# Patient Record
Sex: Female | Born: 1940 | Race: Black or African American | Hispanic: No | Smoking: Former smoker
Health system: Southern US, Community
[De-identification: ages and names within clinical notes are randomized; demographics above are authoritative.]

## PROBLEM LIST (undated history)

## (undated) DIAGNOSIS — C55 Malignant neoplasm of uterus, part unspecified: Secondary | ICD-10-CM

## (undated) HISTORY — PX: ABDOMINAL HYSTERECTOMY: SHX81

---

## 2005-10-28 ENCOUNTER — Emergency Department (HOSPITAL_COMMUNITY): Admission: EM | Admit: 2005-10-28 | Discharge: 2005-10-28 | Payer: Self-pay | Admitting: Family Medicine

## 2019-09-23 ENCOUNTER — Inpatient Hospital Stay (HOSPITAL_COMMUNITY)
Admission: EM | Admit: 2019-09-23 | Discharge: 2019-10-10 | DRG: 180 | Disposition: A | Discharge status: Skilled Nursing Facility | Payer: Medicare Other | Attending: Internal Medicine | Admitting: Internal Medicine

## 2019-09-23 ENCOUNTER — Emergency Department (HOSPITAL_COMMUNITY): Payer: Medicare Other

## 2019-09-23 ENCOUNTER — Encounter (HOSPITAL_COMMUNITY): Payer: Self-pay | Admitting: *Deleted

## 2019-09-23 ENCOUNTER — Other Ambulatory Visit: Payer: Self-pay

## 2019-09-23 DIAGNOSIS — C7802 Secondary malignant neoplasm of left lung: Secondary | ICD-10-CM | POA: Diagnosis present

## 2019-09-23 DIAGNOSIS — C779 Secondary and unspecified malignant neoplasm of lymph node, unspecified: Secondary | ICD-10-CM | POA: Diagnosis present

## 2019-09-23 DIAGNOSIS — Z20822 Contact with and (suspected) exposure to covid-19: Secondary | ICD-10-CM | POA: Diagnosis present

## 2019-09-23 DIAGNOSIS — Z789 Other specified health status: Secondary | ICD-10-CM

## 2019-09-23 DIAGNOSIS — E871 Hypo-osmolality and hyponatremia: Secondary | ICD-10-CM

## 2019-09-23 DIAGNOSIS — J910 Malignant pleural effusion: Secondary | ICD-10-CM | POA: Diagnosis present

## 2019-09-23 DIAGNOSIS — E222 Syndrome of inappropriate secretion of antidiuretic hormone: Secondary | ICD-10-CM | POA: Diagnosis present

## 2019-09-23 DIAGNOSIS — C7951 Secondary malignant neoplasm of bone: Secondary | ICD-10-CM | POA: Diagnosis present

## 2019-09-23 DIAGNOSIS — Z8542 Personal history of malignant neoplasm of other parts of uterus: Secondary | ICD-10-CM | POA: Diagnosis not present

## 2019-09-23 DIAGNOSIS — I871 Compression of vein: Secondary | ICD-10-CM | POA: Diagnosis present

## 2019-09-23 DIAGNOSIS — Z90710 Acquired absence of both cervix and uterus: Secondary | ICD-10-CM

## 2019-09-23 DIAGNOSIS — R042 Hemoptysis: Secondary | ICD-10-CM | POA: Diagnosis present

## 2019-09-23 DIAGNOSIS — Z7401 Bed confinement status: Secondary | ICD-10-CM

## 2019-09-23 DIAGNOSIS — C3411 Malignant neoplasm of upper lobe, right bronchus or lung: Secondary | ICD-10-CM

## 2019-09-23 DIAGNOSIS — J869 Pyothorax without fistula: Secondary | ICD-10-CM | POA: Diagnosis not present

## 2019-09-23 DIAGNOSIS — Z452 Encounter for adjustment and management of vascular access device: Secondary | ICD-10-CM

## 2019-09-23 DIAGNOSIS — B9620 Unspecified Escherichia coli [E. coli] as the cause of diseases classified elsewhere: Secondary | ICD-10-CM | POA: Diagnosis present

## 2019-09-23 DIAGNOSIS — Z800 Family history of malignant neoplasm of digestive organs: Secondary | ICD-10-CM

## 2019-09-23 DIAGNOSIS — C3491 Malignant neoplasm of unspecified part of right bronchus or lung: Secondary | ICD-10-CM | POA: Diagnosis not present

## 2019-09-23 DIAGNOSIS — J90 Pleural effusion, not elsewhere classified: Secondary | ICD-10-CM | POA: Diagnosis present

## 2019-09-23 DIAGNOSIS — R339 Retention of urine, unspecified: Secondary | ICD-10-CM | POA: Diagnosis present

## 2019-09-23 DIAGNOSIS — C3490 Malignant neoplasm of unspecified part of unspecified bronchus or lung: Secondary | ICD-10-CM

## 2019-09-23 DIAGNOSIS — Q256 Stenosis of pulmonary artery: Secondary | ICD-10-CM | POA: Diagnosis not present

## 2019-09-23 DIAGNOSIS — R0602 Shortness of breath: Secondary | ICD-10-CM

## 2019-09-23 DIAGNOSIS — Z87891 Personal history of nicotine dependence: Secondary | ICD-10-CM | POA: Diagnosis not present

## 2019-09-23 DIAGNOSIS — E878 Other disorders of electrolyte and fluid balance, not elsewhere classified: Secondary | ICD-10-CM | POA: Diagnosis present

## 2019-09-23 DIAGNOSIS — I2694 Multiple subsegmental pulmonary emboli without acute cor pulmonale: Secondary | ICD-10-CM

## 2019-09-23 DIAGNOSIS — B961 Klebsiella pneumoniae [K. pneumoniae] as the cause of diseases classified elsewhere: Secondary | ICD-10-CM | POA: Diagnosis present

## 2019-09-23 DIAGNOSIS — C7801 Secondary malignant neoplasm of right lung: Secondary | ICD-10-CM | POA: Diagnosis present

## 2019-09-23 DIAGNOSIS — J9601 Acute respiratory failure with hypoxia: Secondary | ICD-10-CM | POA: Diagnosis present

## 2019-09-23 DIAGNOSIS — R222 Localized swelling, mass and lump, trunk: Secondary | ICD-10-CM | POA: Diagnosis not present

## 2019-09-23 DIAGNOSIS — I2602 Saddle embolus of pulmonary artery with acute cor pulmonale: Secondary | ICD-10-CM | POA: Diagnosis not present

## 2019-09-23 DIAGNOSIS — Z9889 Other specified postprocedural states: Secondary | ICD-10-CM

## 2019-09-23 DIAGNOSIS — Z01811 Encounter for preprocedural respiratory examination: Secondary | ICD-10-CM

## 2019-09-23 DIAGNOSIS — Z515 Encounter for palliative care: Secondary | ICD-10-CM | POA: Diagnosis not present

## 2019-09-23 DIAGNOSIS — I2699 Other pulmonary embolism without acute cor pulmonale: Secondary | ICD-10-CM | POA: Diagnosis present

## 2019-09-23 DIAGNOSIS — I2693 Single subsegmental pulmonary embolism without acute cor pulmonale: Secondary | ICD-10-CM | POA: Diagnosis not present

## 2019-09-23 DIAGNOSIS — C787 Secondary malignant neoplasm of liver and intrahepatic bile duct: Secondary | ICD-10-CM | POA: Diagnosis present

## 2019-09-23 DIAGNOSIS — Z7189 Other specified counseling: Secondary | ICD-10-CM | POA: Diagnosis not present

## 2019-09-23 DIAGNOSIS — R55 Syncope and collapse: Secondary | ICD-10-CM

## 2019-09-23 DIAGNOSIS — Z9689 Presence of other specified functional implants: Secondary | ICD-10-CM

## 2019-09-23 HISTORY — DX: Malignant neoplasm of uterus, part unspecified: C55

## 2019-09-23 LAB — CBC
HCT: 33 % — ABNORMAL LOW (ref 36.0–46.0)
Hemoglobin: 11 g/dL — ABNORMAL LOW (ref 12.0–15.0)
MCH: 26.8 pg (ref 26.0–34.0)
MCHC: 33.3 g/dL (ref 30.0–36.0)
MCV: 80.3 fL (ref 80.0–100.0)
Platelets: 222 10*3/uL (ref 150–400)
RBC: 4.11 MIL/uL (ref 3.87–5.11)
RDW: 15.1 % (ref 11.5–15.5)
WBC: 7.1 10*3/uL (ref 4.0–10.5)
nRBC: 0 % (ref 0.0–0.2)

## 2019-09-23 LAB — BASIC METABOLIC PANEL
Anion gap: 13 (ref 5–15)
BUN: 15 mg/dL (ref 8–23)
CO2: 21 mmol/L — ABNORMAL LOW (ref 22–32)
Calcium: 9.1 mg/dL (ref 8.9–10.3)
Chloride: 87 mmol/L — ABNORMAL LOW (ref 98–111)
Creatinine, Ser: 0.86 mg/dL (ref 0.44–1.00)
GFR calc Af Amer: >60 mL/min (ref >60)
GFR calc non Af Amer: >60 mL/min (ref >60)
Glucose, Bld: 103 mg/dL — ABNORMAL HIGH (ref 70–99)
Potassium: 4.4 mmol/L (ref 3.5–5.1)
Sodium: 121 mmol/L — ABNORMAL LOW (ref 135–145)

## 2019-09-23 LAB — POC SARS CORONAVIRUS 2 AG -  ED: SARS Coronavirus 2 Ag: NEGATIVE

## 2019-09-23 LAB — SARS CORONAVIRUS 2 (TAT 6-24 HRS): SARS Coronavirus 2: NEGATIVE

## 2019-09-23 LAB — TROPONIN I (HIGH SENSITIVITY)
Troponin I (High Sensitivity): 25 ng/L — ABNORMAL HIGH (ref <18)
Troponin I (High Sensitivity): 23 ng/L — ABNORMAL HIGH (ref <18)

## 2019-09-23 MED ORDER — SODIUM CHLORIDE 0.9% FLUSH: 3.0000 mL | Freq: Once | INTRAVENOUS | Status: DC

## 2019-09-23 MED ORDER — SODIUM CHLORIDE 0.9 % IV SOLN: INTRAVENOUS | Status: AC

## 2019-09-23 MED ORDER — IOHEXOL 350 MG/ML SOLN
75.0000 mL | Freq: Once | INTRAVENOUS | Status: AC | PRN
  Administered 2019-09-23: 75 mL via INTRAVENOUS

## 2019-09-23 MED ORDER — ACETAMINOPHEN 650 MG RE SUPP: 650.0000 mg | Freq: Four times a day (QID) | RECTAL | Status: DC | PRN

## 2019-09-23 MED ORDER — HEPARIN (PORCINE) 25000 UT/250ML-% IV SOLN
1050.0000 [IU]/h | INTRAVENOUS | Status: DC
  Administered 2019-09-23: 1200 [IU]/h via INTRAVENOUS
  Filled 2019-09-23 (×2): qty 250

## 2019-09-23 MED ORDER — HEPARIN BOLUS VIA INFUSION
4000.0000 [IU] | Freq: Once | INTRAVENOUS | Status: AC
  Administered 2019-09-23: 4000 [IU] via INTRAVENOUS
  Filled 2019-09-23: qty 4000

## 2019-09-23 MED ORDER — ACETAMINOPHEN 325 MG PO TABS: 650.0000 mg | ORAL_TABLET | Freq: Four times a day (QID) | ORAL | Status: DC | PRN

## 2019-09-23 NOTE — ED Notes (Signed)
When I brought Pt back from [facility] Pt had just had her Vitals taken in [facility], Pulse Ox was 97%. When getting the Pt up, undressed, and settled in Bed Pt's O2 was at 92% with good wave form. Pt was also short of breath with movement. Pt's now at 98% after taking deep breaths

## 2019-09-23 NOTE — ED Notes (Addendum)
Pt asked if she could walk to bathroom but upon standing pt was a little unsteady and short of breath from just getting out of the bed. Talked Pt into using a bedside toilet instead. Pt's O2 dropped to 90% while moving out and in bed. Pt now back in bed and O2 is 97%

## 2019-09-23 NOTE — ED Triage Notes (Signed)
Pt arrived by [facility] for sob and cough x 1 week. Reports cough is productive with blood tinged sputum. Denies fever.

## 2019-09-23 NOTE — ED Provider Notes (Signed)
[facility] Provider Note   CSN: [id number] Arrival date & time: 09/23/19  0443     History Chief Complaint  Patient presents with  . Shortness of Breath    Emily Kim is a 79 y.o. female with no significant past medical history presenting to the ED with a chief complaint of shortness of breath and cough.  Reports cough productive with mucus intermittently productive with blood-tinged sputum.  She reports worsening shortness of breath as well.  She did have an episode 2 days ago when she syncopized while getting out of the shower.  She is unsure how long she lost consciousness for which she believes it was several minutes.  States that it did take her several hours to get up because "I had to figure out what happened."  Denies any headache or vision changes, anticoagulant use.  She denies any chest pain.  No known COVID-19 exposures.  She has tried some over-the-counter cough medication with some improvement in her symptoms.  Denies any fever, but does endorse chills.  Denies any abdominal pain, nausea vomiting, history of DVT, PE, MI, leg swelling.  HPI     History reviewed. No pertinent past medical history.  There are no problems to display for this patient.   History reviewed. No pertinent surgical history.   OB History   No obstetric history on file.     History reviewed. No pertinent family history.  Social History   Tobacco Use  . Smoking status: Never Smoker  Substance Use Topics  . Alcohol use: Not on file  . Drug use: Not on file    Home Medications Prior to Admission medications   Medication Sig Start Date End Date Taking? Authorizing Provider  OVER THE COUNTER MEDICATION Take 1 tablet by mouth daily. Cold and cough medication   Yes [provider]    Allergies    Patient has no known allergies.  Review of Systems   Review of Systems  Constitutional: Negative for appetite change, chills and fever.    HENT: Negative for ear pain, rhinorrhea, sneezing and sore throat.   Eyes: Negative for photophobia and visual disturbance.  Respiratory: Positive for cough and shortness of breath. Negative for chest tightness and wheezing.   Cardiovascular: Negative for chest pain and palpitations.  Gastrointestinal: Negative for abdominal pain, blood in stool, constipation, diarrhea, nausea and vomiting.  Genitourinary: Negative for dysuria, hematuria and urgency.  Musculoskeletal: Negative for myalgias.  Skin: Negative for rash.  Neurological: Negative for dizziness, weakness and light-headedness.    Physical Exam Updated Vital Signs BP 126/61 (BP Location: Right Arm)   Pulse 93   Temp 97.9 F (36.6 C) (Oral)   Resp 20   SpO2 97%   Physical Exam Vitals and nursing note reviewed.  Constitutional:      General: She is not in acute distress.    Appearance: She is well-developed.  HENT:     Head: Normocephalic and atraumatic.     Nose: Nose normal.  Eyes:     General: No scleral icterus.       Left eye: No discharge.     Conjunctiva/sclera: Conjunctivae normal.  Cardiovascular:     Rate and Rhythm: Normal rate and regular rhythm.     Heart sounds: Normal heart sounds. No murmur. No friction rub. No gallop.   Pulmonary:     Effort: Pulmonary effort is normal. No respiratory distress.     Breath sounds: Normal breath sounds.  Abdominal:  General: Bowel sounds are normal. There is no distension.     Palpations: Abdomen is soft.     Tenderness: There is no abdominal tenderness. There is no guarding.  Musculoskeletal:        General: Normal range of motion.     Cervical back: Normal range of motion and neck supple.     Right lower leg: No tenderness.     Left lower leg: No tenderness.  Skin:    General: Skin is warm and dry.     Findings: No rash.  Neurological:     General: No focal deficit present.     Mental Status: She is alert and oriented to person, place, and time.      Cranial Nerves: No cranial nerve deficit.     Motor: No weakness or abnormal muscle tone.     Coordination: Coordination normal.     ED Results / Procedures / Treatments   Labs (all labs ordered are listed, but only abnormal results are displayed) Labs Reviewed  BASIC METABOLIC PANEL - Abnormal; Notable for the following components:      Result Value   Sodium 121 (*)    Chloride 87 (*)    CO2 21 (*)    Glucose, Bld 103 (*)    All other components within normal limits  CBC - Abnormal; Notable for the following components:   Hemoglobin 11.0 (*)    HCT 33.0 (*)    All other components within normal limits  TROPONIN I (HIGH SENSITIVITY) - Abnormal; Notable for the following components:   Troponin I (High Sensitivity) 25 (*)    All other components within normal limits  SARS CORONAVIRUS 2 (TAT 6-24 HRS)  POC SARS CORONAVIRUS 2 AG -  ED  TROPONIN I (HIGH SENSITIVITY)    EKG None  Radiology DG Chest Portable 1 View  Result Date: 09/23/2019 CLINICAL DATA:  Shortness of breath and cough for 1 week, productive blood-tinged sputum EXAM: PORTABLE CHEST 1 VIEW COMPARISON:  None FINDINGS: There is a complex right pleural effusion tracking within the fissures and along the lung apex, worrisome for loculation. Adjacent opacity likely reflect some passive atelectasis though an underlying consolidative processes not excluded. No pneumothorax. Left lung is essentially clear. The aorta is calcified. Right heart border is obscured by adjacent opacity. Remaining cardiomediastinal contours are unremarkable. Degenerative changes are present in the imaged spine and shoulders. No acute osseous or soft tissue abnormality. IMPRESSION: 1. Findings worrisome for complex right pleural effusion. Underlying consolidative processes not excluded. Could consider CT chest with contrast for further characterization. Electronically Signed   By: Lovena Le M.D.   On: 09/23/2019 05:36    Procedures Procedures  (including critical care time)  Medications Ordered in ED Medications  sodium chloride flush (NS) 0.9 % injection 3 mL (has no administration in time range)    ED Course  I have reviewed the triage vital signs and the nursing notes.  Pertinent labs & imaging results that were available during my care of the patient were reviewed by me and considered in my medical decision making (see chart for details).  Clinical Course as of Sep 22 1514  Tue Sep 23, 2019  1406 Sodium(!): 121 [HK]  1446 Troponin I (High Sensitivity)(!): 25 [HK]    Clinical Course User Index [HK] Delia Heady, PA-C   MDM Rules/Calculators/A&P                      Emily Kim was  evaluated in Emergency Department on 09/23/19  for the symptoms described in the history of present illness. He/she was evaluated in the context of the global COVID-19 pandemic, which necessitated consideration that the patient might be at risk for infection with the SARS-CoV-2 virus that causes COVID-19. Institutional protocols and algorithms that pertain to the evaluation of patients at risk for COVID-19 are in a state of rapid change based on information released by regulatory bodies including the CDC and federal and state organizations. These policies and algorithms were followed during the patient's care in the ED.  79 year old female with no significant past medical history presenting to the ED with a chief complaint of shortness of breath and cough.  Cough is productive with mucus and intermittently blood tinged.  Had a syncopal episode 2 days ago while getting out of the shower.  Denies chest pain, headache, vision changes, numbness in arms or legs.  No known COVID-19 exposures. No neurological deficits noted on my examination. She is not tachycardic, tachypnec or hypoxic.  Work-up significant for hyponatremia 121, elevated troponin at 25. POC covid test is negative. CXR shows possible infiltrate vs pleural effusion. Will obtain CT head due  to her recent unwitnessed syncopal episode as well as a chest CT to better characterize this CXR finding and evaluate for PE. She will need admission for her hyponatremia and shortness of breath. Care handed off to oncoming provider pending remainder of workup and disposition.   Final Clinical Impression(s) / ED Diagnoses Final diagnoses:  Hyponatremia  SOB (shortness of breath)  Syncope, unspecified syncope type  Pleural effusion    Rx / DC Orders ED Discharge Orders    None      Portions of this note were generated with Dragon dictation software. Dictation errors may occur despite best attempts at proofreading.    Delia Heady, PA-C 09/23/19 1517    Lajean Saver, MD 09/26/19 1425

## 2019-09-23 NOTE — H&P (Addendum)
TRH H&P    Patient Demographics:    Emily Kim, is a 79 y.o. female  MRN: [id number]  DOB - 1940-09-28  Admit Date - 09/23/2019  Referring MD/NP/PA:  Madilyn Hook  Outpatient Primary MD for the patient is Patient, No Pcp Per  Patient coming from:  home  Chief complaint- hemoptysis   HPI:    Emily Kim  is a 79 y.o. female,  w hx of uterine cancer s/p hysterectomy [place], [place]), apparently notes 50lbs of weight loss over the past 3 month and hemoptysis intermittently over the past 2 weeks. Pt denies fever, chills, productive cough, cp, palp, abd pain, n/v, diarrhea, brbpr, black stool.  Pt denies head / neck swelling.   In ED,  T 97.6, P 98 R 18, Bp 151/64  Pox 100% onRA  Wt 74.8 kg  CT brain  IMPRESSION: 1. No acute intracranial abnormality. 2. Mild cerebral atrophy and microvascular disease changes of the supratentorial brain. 3. Moderate severity right maxillary sinus mucosal thickening. The presence of a large polyp versus mucous retention cyst cannot be excluded.  CTA chest IMPRESSION: 1. Acute segmental pulmonary emboli in the left lower lobe. 2. Widely metastatic lung cancer. Large ill-defined right hilar and suprahilar mass with bilateral pulmonary parenchymal metastases and lymphangitic carcinomatosis. Multiple osseous metastases.  3. The dominant mass results in severe stenosis of the SVC with multiple mediastinal and right chest wall collateral veins, prominent filling of the azygos system and IVC, as well as enhancement of the anterior left hepatic lobe. Correlate for signs and symptoms of SVC syndrome. 4. Similar resultant severe stenosis of the distal right pulmonary artery and narrowing of the right upper, middle, and lower lobe bronchi with complete collapse of the right middle lobe. 5. Moderate loculated right pleural effusion. Small left pleural Effusion.  Na 121, K 4.4,  Bun 15, Creatinine 0.86 Wbc 7.1, hgb 11.0, Plt 222 Trop 25-> 23  Sars Coronavirus negative   Pt will be admitted for pulmonary embolism with widely metastatic lung cancer, large ill defined right hilar and suprahilar mass with bilateral pulmonary metastates and lymphangitis carcinomatosis, and multiple osseous metastases and ? SVC syndrome   Review of systems:    In addition to the HPI above,  No Fever-chills, No Headache, No changes with Vision or hearing, No problems swallowing food or Liquids, No Chest pain, No Cough or Shortness of Breath, No Abdominal pain, No Nausea or Vomiting, bowel movements are regular, No Blood in stool or Urine, No dysuria, No new skin rashes or bruises, No new joints pains-aches,  No new weakness, tingling, numbness in any extremity, No recent weight gain or loss, No polyuria, polydypsia or polyphagia, No significant Mental Stressors.  All other systems reviewed and are negative.    Past History of the following :    Past Medical History:  Diagnosis Date  . Uterine cancer [facility])       Past Surgical History:  Procedure Laterality Date  . ABDOMINAL HYSTERECTOMY        Social History:  Social History   Tobacco Use  . Smoking status: Former Research scientist (life sciences)  . Smokeless tobacco: Never Used  Substance Use Topics  . Alcohol use: Not Currently       Family History :     Family History  Problem Relation Age of Onset  . Cancer Neg Hx        Home Medications:   Prior to Admission medications   Medication Sig Start Date End Date Taking? Authorizing Provider  OVER THE COUNTER MEDICATION Take 1 tablet by mouth daily. Cold and cough medication   Yes [provider]     Allergies:    No Known Allergies   Physical Exam:   Vitals  Blood pressure (!) 152/76, pulse 96, temperature 97.9 F (36.6 C), temperature source Oral, resp. rate (!) 30, height _0  (1.702 m), weight 74.8 kg, SpO2 100 %.  1.  General: axoxo3  2.  Psychiatric: euthymic  3. Neurologic: nonfocal  4. HEENMT:  Anicteric, pupils 1.52m symmetric, direct, consensual, near intact, no head swelling Neck: no jvd, no cervical / supraclavicular adenopathy, no stridor  5. Respiratory : Decrease in bs at the right lung base, no wheezing  6. Cardiovascular : rrr s1, s2, no m/g/r  7. Gastrointestinal:  Abd: soft, nt, nd , +bs  8. Skin:  Ext: (lower) no c/c/e,  No rash,  ? Slight swelling of the right arm compared to left ?  9.Musculoskeletal:  Good ROM    Data Review:    CBC Recent Labs  Lab 09/23/19 0507  WBC 7.1  HGB 11.0*  HCT 33.0*  PLT 222  MCV 80.3  MCH 26.8  MCHC 33.3  RDW 15.1   ------------------------------------------------------------------------------------------------------------------  Results for orders placed or performed during the hospital encounter of 09/23/19 (from the past 48 hour(s))  Basic metabolic panel     Status: Abnormal   Collection Time: 09/23/19  5:07 AM  Result Value Ref Range   Sodium 121 (L) 135 - 145 mmol/L   Potassium 4.4 3.5 - 5.1 mmol/L   Chloride 87 (L) 98 - 111 mmol/L   CO2 21 (L) 22 - 32 mmol/L   Glucose, Bld 103 (H) 70 - 99 mg/dL   BUN 15 8 - 23 mg/dL   Creatinine, Ser 0.86 0.44 - 1.00 mg/dL   Calcium 9.1 8.9 - 10.3 mg/dL   GFR calc non Af Amer >60 >60 mL/min   GFR calc Af Amer >60 >60 mL/min   Anion gap 13 5 - 15    Comment: Performed at [facility] [id number], [place] [id number] CBC     Status: Abnormal   Collection Time: 09/23/19  5:07 AM  Result Value Ref Range   WBC 7.1 4.0 - 10.5 K/uL   RBC 4.11 3.87 - 5.11 MIL/uL   Hemoglobin 11.0 (L) 12.0 - 15.0 g/dL   HCT 33.0 (L) 36.0 - 46.0 %   MCV 80.3 80.0 - 100.0 fL   MCH 26.8 26.0 - 34.0 pg   MCHC 33.3 30.0 - 36.0 g/dL   RDW 15.1 11.5 - 15.5 %   Platelets 222 150 - 400 K/uL   nRBC 0.0 0.0 - 0.2 %    Comment: Performed at [facility] [id number], [id number]   Troponin I (High Sensitivity)     Status: Abnormal   Collection Time: 09/23/19  5:07 AM  Result Value Ref Range   Troponin I (High Sensitivity) 25 (H) <  18 ng/L    Comment: (NOTE) Elevated high sensitivity troponin I (hsTnI) values and significant  changes across serial measurements may suggest ACS but many other  chronic and acute conditions are known to elevate hsTnI results.  Refer to the "Links" section for chest pain algorithms and additional  guidance. Performed at [facility], [place] [address] [place], [place] [address]   POC SARS Coronavirus 2 Ag-ED - Nasal Swab (BD Veritor Kit)     Status: None   Collection Time: 09/23/19  5:32 AM  Result Value Ref Range   SARS Coronavirus 2 Ag NEGATIVE NEGATIVE    Comment: (NOTE) SARS-CoV-2 antigen NOT DETECTED.  Negative results are presumptive.  Negative results do not preclude SARS-CoV-2 infection and should not be used as the sole basis for treatment or other patient management decisions, including infection  control decisions, particularly in the presence of clinical signs and  symptoms consistent with COVID-19, or in those who have been in contact with the virus.  Negative results must be combined with clinical observations, patient history, and epidemiological information. The expected result is Negative. Fact Sheet for Patients: PodPark.tn Fact Sheet for Healthcare Providers: GiftContent.is This test is not yet approved or cleared by the [place] FDA and  has been authorized for detection and/or diagnosis of SARS-CoV-2 by FDA under an Emergency Use Authorization (EUA).  This EUA will remain in effect (meaning this test can be used) for the duration of  the COVID-19 de claration under Section 564(b)(1) of the Act, 21 U.S.C. section 360bbb-3(b)(1), unless the authorization is terminated or revoked sooner.   Troponin I (High Sensitivity)     Status: Abnormal    Collection Time: 09/23/19  2:30 PM  Result Value Ref Range   Troponin I (High Sensitivity) 23 (H) <18 ng/L    Comment: (NOTE) Elevated high sensitivity troponin I (hsTnI) values and significant  changes across serial measurements may suggest ACS but many other  chronic and acute conditions are known to elevate hsTnI results.  Refer to the "Links" section for chest pain algorithms and additional  guidance. Performed at [facility], [place] [address], [place], [place] [address]   SARS CORONAVIRUS 2 (TAT 6-24 HRS) Nasopharyngeal Nasopharyngeal Swab     Status: None   Collection Time: 09/23/19  2:51 PM   Specimen: Nasopharyngeal Swab  Result Value Ref Range   SARS Coronavirus 2 NEGATIVE NEGATIVE    Comment: (NOTE) SARS-CoV-2 target nucleic acids are NOT DETECTED. The SARS-CoV-2 RNA is generally detectable in upper and lower respiratory specimens during the acute phase of infection. Negative results do not preclude SARS-CoV-2 infection, do not rule out co-infections with other pathogens, and should not be used as the sole basis for treatment or other patient management decisions. Negative results must be combined with clinical observations, patient history, and epidemiological information. The expected result is Negative. Fact Sheet for Patients: SugarRoll.be Fact Sheet for Healthcare Providers: https://www.woods-mathews.com/ This test is not yet approved or cleared by the [place] FDA and  has been authorized for detection and/or diagnosis of SARS-CoV-2 by FDA under an Emergency Use Authorization (EUA). This EUA will remain  in effect (meaning this test can be used) for the duration of the COVID-19 declaration under Section 56 4(b)(1) of the Act, 21 U.S.C. section 360bbb-3(b)(1), unless the authorization is terminated or revoked sooner. Performed at [facility], [place] [address] [place], [place] [address]     Chemistries    Recent Labs  Lab 09/23/19 0507  NA 121*  K 4.4  CL 87*  CO2 21*  GLUCOSE 103*  BUN 15  CREATININE 0.86  CALCIUM 9.1   ------------------------------------------------------------------------------------------------------------------  ------------------------------------------------------------------------------------------------------------------ GFR: Estimated Creatinine Clearance: 56.9 mL/min (by C-G formula based on SCr of 0.86 mg/dL). Liver Function Tests: No results for input(s): AST, ALT, ALKPHOS, BILITOT, PROT, ALBUMIN in the last 168 hours. No results for input(s): LIPASE, AMYLASE in the last 168 hours. No results for input(s): AMMONIA in the last 168 hours. Coagulation Profile: No results for input(s): INR, PROTIME in the last 168 hours. Cardiac Enzymes: No results for input(s): CKTOTAL, CKMB, CKMBINDEX, TROPONINI in the last 168 hours. BNP (last 3 results) No results for input(s): PROBNP in the last 8760 hours. HbA1C: No results for input(s): HGBA1C in the last 72 hours. CBG: No results for input(s): GLUCAP in the last 168 hours. Lipid Profile: No results for input(s): CHOL, HDL, LDLCALC, TRIG, CHOLHDL, LDLDIRECT in the last 72 hours. Thyroid Function Tests: No results for input(s): TSH, T4TOTAL, FREET4, T3FREE, THYROIDAB in the last 72 hours. Anemia Panel: No results for input(s): VITAMINB12, FOLATE, FERRITIN, TIBC, IRON, RETICCTPCT in the last 72 hours.  --------------------------------------------------------------------------------------------------------------- Urine analysis: No results found for: COLORURINE, APPEARANCEUR, LABSPEC, PHURINE, GLUCOSEU, HGBUR, BILIRUBINUR, KETONESUR, PROTEINUR, UROBILINOGEN, NITRITE, LEUKOCYTESUR    Imaging Results:    CT Head Wo Contrast  Result Date: 09/23/2019 CLINICAL DATA:  Status post trauma. EXAM: CT HEAD WITHOUT CONTRAST TECHNIQUE: Contiguous axial images were obtained from the base of the skull through the vertex  without intravenous contrast. COMPARISON:  None. FINDINGS: Brain: There is mild cerebral atrophy with widening of the extra-axial spaces and ventricular dilatation. There are areas of decreased attenuation within the white matter tracts of the supratentorial brain, consistent with microvascular disease changes. Vascular: No hyperdense vessel or unexpected calcification. Skull: Normal. Negative for fracture or focal lesion. Sinuses/Orbits: There is moderate severity right maxillary sinus mucosal thickening. Other: None. IMPRESSION: 1. No acute intracranial abnormality. 2. Mild cerebral atrophy and microvascular disease changes of the supratentorial brain. 3. Moderate severity right maxillary sinus mucosal thickening. The presence of a large polyp versus mucous retention cyst cannot be excluded. Electronically Signed   By: Virgina Norfolk M.D.   On: 09/23/2019 20:38   CT Angio Chest PE W/Cm &/Or Wo Cm  Result Date: 09/23/2019 CLINICAL DATA:  Shortness of breath and cough for the past week. Blood tinged sputum. EXAM: CT ANGIOGRAPHY CHEST WITH CONTRAST TECHNIQUE: Multidetector CT imaging of the chest was performed using the standard protocol during bolus administration of intravenous contrast. Multiplanar CT image reconstructions and MIPs were obtained to evaluate the vascular anatomy. CONTRAST:  42m OMNIPAQUE IOHEXOL 350 MG/ML SOLN COMPARISON:  Chest x-ray from same day. FINDINGS: Cardiovascular: Satisfactory opacification of the pulmonary arteries to the segmental level. Acute segmental pulmonary emboli in the left lower lobe. Severe stenosis of the distal right pulmonary artery. Normal heart size. No pericardial effusion. Severe stenosis of the SVC with multiple mediastinal and right chest wall collateral veins, and prominent enhancement of the azygos system. No thoracic aortic aneurysm. Mediastinum/Nodes: No enlarged mediastinal, hilar, or axillary lymph nodes. Thyroid gland, trachea, and esophagus demonstrate  no significant findings. Lungs/Pleura: Large ill-defined right hilar and suprahilar mass with narrowing of the right upper, middle, and lower lobe bronchi. Complete collapse of the right middle lobe. Perilymphatic nodularity and innumerable small pulmonary nodules in both lungs. Moderate loculated right pleural effusion. Small left pleural effusion. Upper Abdomen: Enhancement of the anterior left hepatic  lobe. Punctate nonobstructive left renal calculus. Musculoskeletal: Sclerosis of the lateral right ninth rib and left aspect of the sternal body. Sclerotic lesions involving the T3, T6, T7, T9, T10, and T12 vertebral bodies. Review of the MIP images confirms the above findings. IMPRESSION: 1. Acute segmental pulmonary emboli in the left lower lobe. 2. Widely metastatic lung cancer. Large ill-defined right hilar and suprahilar mass with bilateral pulmonary parenchymal metastases and lymphangitic carcinomatosis. Multiple osseous metastases. 3. The dominant mass results in severe stenosis of the SVC with multiple mediastinal and right chest wall collateral veins, prominent filling of the azygos system and IVC, as well as enhancement of the anterior left hepatic lobe. Correlate for signs and symptoms of SVC syndrome. 4. Similar resultant severe stenosis of the distal right pulmonary artery and narrowing of the right upper, middle, and lower lobe bronchi with complete collapse of the right middle lobe. 5. Moderate loculated right pleural effusion. Small left pleural effusion. Critical Value/emergent results were called by telephone at the time of interpretation on 09/23/2019 at 8:59 pm to provider Claiborne Billings PA, who verbally acknowledged these results. Electronically Signed   By: Titus Dubin M.D.   On: 09/23/2019 21:08   DG Chest Portable 1 View  Result Date: 09/23/2019 CLINICAL DATA:  Shortness of breath and cough for 1 week, productive blood-tinged sputum EXAM: PORTABLE CHEST 1 VIEW COMPARISON:  None FINDINGS: There is  a complex right pleural effusion tracking within the fissures and along the lung apex, worrisome for loculation. Adjacent opacity likely reflect some passive atelectasis though an underlying consolidative processes not excluded. No pneumothorax. Left lung is essentially clear. The aorta is calcified. Right heart border is obscured by adjacent opacity. Remaining cardiomediastinal contours are unremarkable. Degenerative changes are present in the imaged spine and shoulders. No acute osseous or soft tissue abnormality. IMPRESSION: 1. Findings worrisome for complex right pleural effusion. Underlying consolidative processes not excluded. Could consider CT chest with contrast for further characterization. Electronically Signed   By: Lovena Le M.D.   On: 09/23/2019 05:36   ekg nsr at 100, RAD, no st-t changes c/w ischemia   Assessment & Plan:    Principal Problem:   Pulmonary embolism (HCC) Active Problems:   SVC syndrome   Pleural effusion  Acute pulmonary embolism Elevated troponin Tele Trop I  Check cardiac echo Heparin GTT  Widely metastatic lung cancer (Large ill defined right hilar and suprahilar mass with bilateral pulmonary metastates and multiple osseous metastases) Lymphangitis carcinomatosis  Check LFT Oncology consulted by ED, appreciate input  SVC stenosis (severe) R Pleural effusion, loculated (moderate) Spoke with CT surgery to help evaluate Lung cancer and effusion/ SVC, appreciate input.   Hyponatremia Check serum osm, cortisol, tsh Check urine osm, urine sodium Hydrate gently with ns at 87m per hour Check cmp in am  Anemia Check cbc in am   DVT Prophylaxis-   Heparin GTT  AM Labs Ordered, also please review Full Orders  Family Communication: Admission, patients condition and plan of care including tests being ordered have been discussed with the patient  who indicate understanding and agree with the plan and Code Status.  Code Status:  FULL CODE per patient,   [facility]  Admission status: Inpatient: Based on patients clinical presentation and evaluation of above clinical data, I have made determination that patient meets Inpatient criteria at this time.  Pt has new diagnosis of lung cancer, with PE,  Pt has high risk of clinical deterioration,  Pt will require > 2 nites stay.   Time spent in minutes : 70    Jani Gravel M.D on 09/23/2019 at 10:50 PM

## 2019-09-23 NOTE — Progress Notes (Signed)
ANTICOAGULATION CONSULT NOTE - Initial Consult  Pharmacy Consult for heparin Indication: pulmonary embolus   Patient Measurements: Heparin Dosing Weight: 74 kg  Vital Signs: Temp: 97.9 F (36.6 C) (01/05 1000) Temp Source: Oral (01/05 1000) BP: 153/66 (01/05 1930) Pulse Rate: 91 (01/05 1930)  Labs: Recent Labs    09/23/19 0507 09/23/19 1430  HGB 11.0*  --   HCT 33.0*  --   PLT 222  --   CREATININE 0.86  --   TROPONINIHS 25* 23*      Assessment: 79 yo female admitted with SOB and cough. She is Covid negative. Underwent CTA which revealed a segmental PE. Pt reports bloody sputum prior to admission and a syncopal event a couple days ago. H/h plts wnl, SCr 0.8. Will start heparin infusion.   Goal of Therapy:  Heparin level 0.3-0.7 units/ml Monitor platelets by anticoagulation protocol: Yes     Plan:  -Heparin bolus 4000 units x1 then 1200 units/hr -Daily HL, CBC -First level tomorrow   Harvel Quale 09/23/2019,9:28 PM

## 2019-09-23 NOTE — ED Provider Notes (Signed)
  Physical Exam  BP (!) 152/76   Pulse 96   Temp 97.9 F (36.6 C) (Oral)   Resp (!) 30   Ht 5\' 7"  (1.702 m)   Wt 74.8 kg   SpO2 100%   BMI 25.84 kg/m   Physical Exam  ED Course/Procedures   Clinical Course as of Sep 22 2254  Tue Sep 23, 2019  1406 Sodium(!): 121 [HK]  1446 Troponin I (High Sensitivity)(!): 25 [HK]  1528 Will likely need admission for further workup and treatment of hyponatremia of 121 as well as cough/hemoptysis. Currently stable and not requiring oxygen   [KM]  2051 Spoke with radiologist. CTA reading Metastatic lung caner, SCC syndrome, bone mets and scattered segmental PE. Will initiate heparin per pharmacy consult and consult for admission.    [KM]  2211 Consulted with Dr. Maudie Mercury for admission. Also waiting on oncology consult   [KM]  2227 Spoke with oncology who will consult on the patient here as well. They report thoracic surgery or vascular surgery may be consult for possible stenting of the SVC   [KM]    Clinical Course User Index [HK] Delia Heady, PA-C [KM] Alveria Apley, PA-C    Procedures  MDM  Patient care assumed from [facility] PA due to change of shift. See below copied HPI "Emily Kim is a 79 y.o. female with no significant past medical history presenting to the ED with a chief complaint of shortness of breath and cough.  Reports cough productive with mucus intermittently productive with blood-tinged sputum.  She reports worsening shortness of breath as well.  She did have an episode 2 days ago when she syncopized while getting out of the shower.  She is unsure how long she lost consciousness for which she believes it was several minutes.  States that it did take her several hours to get up because "I had to figure out what happened."  Denies any headache or vision changes, anticoagulant use.  She denies any chest pain.  No known COVID-19 exposures.  She has tried some over-the-counter cough medication with some improvement in her symptoms.   Denies any fever, but does endorse chills.  Denies any abdominal pain, nausea vomiting, history of DVT, PE, MI, leg swelling."      Kristine Royal 09/23/19 2256    Lucrezia Starch, MD 09/25/19 1454

## 2019-09-24 ENCOUNTER — Inpatient Hospital Stay (HOSPITAL_COMMUNITY): Payer: Medicare Other

## 2019-09-24 ENCOUNTER — Ambulatory Visit
Admission: RE | Admit: 2019-09-24 | Discharge: 2019-09-24 | Disposition: A | Discharge status: Home / Self Care | Payer: Medicare Other | Source: Ambulatory Visit | Attending: Radiation Oncology | Admitting: Radiation Oncology

## 2019-09-24 ENCOUNTER — Inpatient Hospital Stay
Admit: 2019-09-24 | Discharge: 2019-09-24 | Disposition: A | Discharge status: Home / Self Care | Payer: Medicare Other | Attending: Radiation Oncology | Admitting: Radiation Oncology

## 2019-09-24 DIAGNOSIS — I2699 Other pulmonary embolism without acute cor pulmonale: Secondary | ICD-10-CM

## 2019-09-24 DIAGNOSIS — C3490 Malignant neoplasm of unspecified part of unspecified bronchus or lung: Secondary | ICD-10-CM

## 2019-09-24 DIAGNOSIS — C7951 Secondary malignant neoplasm of bone: Secondary | ICD-10-CM | POA: Insufficient documentation

## 2019-09-24 DIAGNOSIS — E871 Hypo-osmolality and hyponatremia: Secondary | ICD-10-CM

## 2019-09-24 DIAGNOSIS — J869 Pyothorax without fistula: Secondary | ICD-10-CM

## 2019-09-24 DIAGNOSIS — C3411 Malignant neoplasm of upper lobe, right bronchus or lung: Secondary | ICD-10-CM | POA: Insufficient documentation

## 2019-09-24 DIAGNOSIS — I871 Compression of vein: Secondary | ICD-10-CM | POA: Insufficient documentation

## 2019-09-24 DIAGNOSIS — Z510 Encounter for antineoplastic radiation therapy: Secondary | ICD-10-CM | POA: Insufficient documentation

## 2019-09-24 DIAGNOSIS — C782 Secondary malignant neoplasm of pleura: Secondary | ICD-10-CM | POA: Insufficient documentation

## 2019-09-24 DIAGNOSIS — R222 Localized swelling, mass and lump, trunk: Secondary | ICD-10-CM

## 2019-09-24 DIAGNOSIS — I2694 Multiple subsegmental pulmonary emboli without acute cor pulmonale: Secondary | ICD-10-CM

## 2019-09-24 LAB — COMPREHENSIVE METABOLIC PANEL
ALT: 14 U/L (ref 0–44)
AST: 25 U/L (ref 15–41)
Albumin: 3.1 g/dL — ABNORMAL LOW (ref 3.5–5.0)
Alkaline Phosphatase: 172 U/L — ABNORMAL HIGH (ref 38–126)
Anion gap: 17 — ABNORMAL HIGH (ref 5–15)
BUN: 18 mg/dL (ref 8–23)
CO2: 17 mmol/L — ABNORMAL LOW (ref 22–32)
Calcium: 9.2 mg/dL (ref 8.9–10.3)
Chloride: 88 mmol/L — ABNORMAL LOW (ref 98–111)
Creatinine, Ser: 0.83 mg/dL (ref 0.44–1.00)
GFR calc Af Amer: >60 mL/min (ref >60)
GFR calc non Af Amer: >60 mL/min (ref >60)
Glucose, Bld: 95 mg/dL (ref 70–99)
Potassium: 4.7 mmol/L (ref 3.5–5.1)
Sodium: 122 mmol/L — ABNORMAL LOW (ref 135–145)
Total Bilirubin: 0.6 mg/dL (ref 0.3–1.2)
Total Protein: 7.1 g/dL (ref 6.5–8.1)

## 2019-09-24 LAB — CBC
HCT: 33.7 % — ABNORMAL LOW (ref 36.0–46.0)
Hemoglobin: 10.9 g/dL — ABNORMAL LOW (ref 12.0–15.0)
MCH: 25.8 pg — ABNORMAL LOW (ref 26.0–34.0)
MCHC: 32.3 g/dL (ref 30.0–36.0)
MCV: 79.9 fL — ABNORMAL LOW (ref 80.0–100.0)
Platelets: 199 10*3/uL (ref 150–400)
RBC: 4.22 MIL/uL (ref 3.87–5.11)
RDW: 15.1 % (ref 11.5–15.5)
WBC: 8 10*3/uL (ref 4.0–10.5)
nRBC: 0 % (ref 0.0–0.2)

## 2019-09-24 LAB — PROTIME-INR
INR: 1.3 — ABNORMAL HIGH (ref 0.8–1.2)
Prothrombin Time: 15.9 seconds — ABNORMAL HIGH (ref 11.4–15.2)

## 2019-09-24 LAB — HEPARIN LEVEL (UNFRACTIONATED)
Heparin Unfractionated: 0.64 IU/mL (ref 0.30–0.70)
Heparin Unfractionated: 0.86 IU/mL — ABNORMAL HIGH (ref 0.30–0.70)

## 2019-09-24 LAB — BASIC METABOLIC PANEL
Anion gap: 15 (ref 5–15)
BUN: 18 mg/dL (ref 8–23)
CO2: 17 mmol/L — ABNORMAL LOW (ref 22–32)
Calcium: 9.1 mg/dL (ref 8.9–10.3)
Chloride: 91 mmol/L — ABNORMAL LOW (ref 98–111)
Creatinine, Ser: 0.76 mg/dL (ref 0.44–1.00)
GFR calc Af Amer: >60 mL/min (ref >60)
GFR calc non Af Amer: >60 mL/min (ref >60)
Glucose, Bld: 98 mg/dL (ref 70–99)
Potassium: 4.5 mmol/L (ref 3.5–5.1)
Sodium: 123 mmol/L — ABNORMAL LOW (ref 135–145)

## 2019-09-24 LAB — OSMOLALITY, URINE: Osmolality, Ur: 806 mOsm/kg (ref 300–900)

## 2019-09-24 LAB — CORTISOL: Cortisol, Plasma: 30.9 ug/dL

## 2019-09-24 LAB — TROPONIN I (HIGH SENSITIVITY): Troponin I (High Sensitivity): 35 ng/L — ABNORMAL HIGH (ref <18)

## 2019-09-24 LAB — OSMOLALITY: Osmolality: 259 mOsm/kg — ABNORMAL LOW (ref 275–295)

## 2019-09-24 LAB — SODIUM, URINE, RANDOM: Sodium, Ur: >10 mmol/L

## 2019-09-24 LAB — TSH: TSH: 3.807 u[IU]/mL (ref 0.350–4.500)

## 2019-09-24 MED ORDER — GUAIFENESIN-DM 100-10 MG/5ML PO SYRP
10.0000 mL | ORAL_SOLUTION | ORAL | Status: DC | PRN
  Administered 2019-09-24 – 2019-10-04 (×5): 10 mL via ORAL
  Filled 2019-09-24 (×5): qty 10

## 2019-09-24 MED ORDER — SODIUM CHLORIDE 0.9 % IV SOLN: INTRAVENOUS | Status: DC

## 2019-09-24 MED ORDER — IPRATROPIUM-ALBUTEROL 0.5-2.5 (3) MG/3ML IN SOLN
3.0000 mL | RESPIRATORY_TRACT | Status: DC | PRN
  Administered 2019-09-24: 3 mL via RESPIRATORY_TRACT
  Filled 2019-09-24: qty 3

## 2019-09-24 NOTE — Progress Notes (Addendum)
Patient arrived to 4E room 21 at this time via [facility] from [place] after having  radiation treatment. Heparin infusing at 12units/hr. Dose changed per order. CHG bath done. Telemetry applied and CCMD notified. V/s and assessment complete. Patient oriented to room, call bell and how to call nurse with any needs. Will continue to monitor.   Emelda Fear, RN

## 2019-09-24 NOTE — Progress Notes (Signed)
PRELIMINARY NOTE:  Met w patient, discussed likely stage 4 lung cancer diagnosis. Full note to follow. At [facility] point:  (a) sputum cytology x3  (b) RASD ONCX consult: consider palliative radiation to primary, in which case transfer to [facility] may facilitate treatments  Will follow with you  GM

## 2019-09-24 NOTE — CV Procedure (Signed)
2D echo attempted but patient was transferred to [facility] long for treatment. Will try later when she returns.

## 2019-09-24 NOTE — Consult Note (Signed)
Radiation Oncology         [phone] ________________________________  Name: Emily Kim        MRN: [id number]  Date of Service: 09/24/19 DOB: 1941-04-11  TK:[id number], No Pcp Per  No ref. provider found     REFERRING PHYSICIAN: Dr. Jana Hakim  DIAGNOSIS: The primary encounter diagnosis was Hyponatremia. Diagnoses of SOB (shortness of breath), Syncope, unspecified syncope type, Pleural effusion, Primary malignant neoplasm of lung metastatic to other site, unspecified laterality ([place]), and Multiple subsegmental pulmonary emboli without acute cor pulmonale ([place]) were also pertinent to this visit.   HISTORY OF PRESENT ILLNESS: Emily Kim is a 79 y.o. female seen at the request of Dr. 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  The patient has been experiencing increasing shortness of breath cough and in the last few months up to 50 pounds of weight loss.  She had been avoiding medical care because of concerns for current contracting coronavirus if she presented to hospital or health system.  When she arrived by EMS, chest x-ray revealed findings worrisome for right effusion with a possible consolidative process underlying.CT angiogram of the chest revealed an acute segmental pulmonary embolism in the left lower lobe with concerns for widely metastatic lung cancer a large mass in the right hilum and supra hilum with bilateral pulmonary parenchymal metastases and lymphangitic carcinomatosis.  Multiple osseous metastases were identified and the dominant mass appeared to compress the superior vena cava.  Severe stenosis of the SVC was noted with multiple mediastinal and right chest wall collateral veins prominent azygous system without aneurysm.  Sclerotic changes in the right ninth rib, left sternal body, and sclerotic lesions were also noted throughout the thoracic vertebral bodies.  A CT of the head was negative for acute findings.  The suspicion is that she most likely has  advanced lung cancer, and is planning to proceed in the morning to the operating room with Dr. Roxan Hockey to obtain tissue confirmation.  We were asked to see the patient to discuss options of urgent radiotherapy to the chest.  PREVIOUS RADIATION THERAPY: No   PAST MEDICAL HISTORY:  Past Medical History:  Diagnosis Date  . Uterine cancer ([place])        PAST SURGICAL HISTORY: Past Surgical History:  Procedure Laterality Date  . ABDOMINAL HYSTERECTOMY       FAMILY HISTORY:  Family History  Problem Relation Age of Onset  . Cancer Neg Hx      SOCIAL HISTORY:  reports that she has quit smoking. She has never used smokeless tobacco. She reports previous alcohol use. She reports that she does not use drugs.  The patient is single and resides in [place].  She has 2 adult sons 1 lives in [place], [place], and the other [place] lives in [place].  The patient has a brother who also lives in [place], [place].  She is given permission for [place] to speak with any of her family members above.   ALLERGIES: Patient has no known allergies.   MEDICATIONS:  Current Facility-Administered Medications  Medication Dose Route Frequency Provider Last Rate Last Admin  . acetaminophen (TYLENOL) tablet 650 mg  650 mg Oral Q6H PRN Jani Gravel, MD       Or  . acetaminophen (TYLENOL) suppository 650 mg  650 mg Rectal Q6H PRN Jani Gravel, MD      . heparin ADULT infusion 100 units/mL (25000 units/239mL sodium chloride 0.45%)  1,200 Units/hr Intravenous Continuous Harvel Quale, [facility] 12  mL/hr at 09/23/19 2221 1,200 Units/hr at 09/23/19 2221  . sodium chloride flush (NS) 0.9 % injection 3 mL  3 mL Intravenous Once Lajean Saver, MD       Current Outpatient Medications  Medication Sig Dispense Refill  . OVER THE COUNTER MEDICATION Take 1 tablet by mouth daily. Cold and cough medication       REVIEW OF SYSTEMS: On review of systems, the patient reports that she has been struggling for several weeks like  this.  She is having hard time laying flat, she admits to hemoptysis for several weeks, and reports that she has blood-tinged changes of her mucus when she coughs with most episodes.  She has lost about 50 pounds unintentionally, she reports that she is having significant difficulty with shortness of breath and cough.  She is feeling slightly better with the assistance of supplemental oxygen.  No other complaints are verbalized.    PHYSICAL EXAM:  Wt Readings from Last 3 Encounters:  09/23/19 165 lb (74.8 kg)   Temp Readings from Last 3 Encounters:  09/23/19 97.9 F (36.6 C) (Oral)   BP Readings from Last 3 Encounters:  09/24/19 (!) 146/77   Pulse Readings from Last 3 Encounters:  09/24/19 92   Pain Assessment Pain Score: 0-No pain/10  In general this is a acutely ill appearing African-American female in no acute distress.  She's alert and oriented x4 and appropriate throughout the examination.  She has significant hoarseness that makes it difficult for her to speak comfortably. Cardiopulmonary assessment is negative for acute distress though she is  tachypneic   ECOG = 4  0 - Asymptomatic (Fully active, able to carry on all predisease activities without restriction)  1 - Symptomatic but completely ambulatory (Restricted in physically strenuous activity but ambulatory and able to carry out work of a light or sedentary nature. For example, light housework, office work)  2 - Symptomatic, <50% in bed during the day (Ambulatory and capable of all self care but unable to carry out any work activities. Up and about more than 50% of waking hours)  3 - Symptomatic, >50% in bed, but not bedbound (Capable of only limited self-care, confined to bed or chair 50% or more of waking hours)  4 - Bedbound (Completely disabled. Cannot carry on any self-care. Totally confined to bed or chair)  5 - Death   Eustace Pen MM, Creech RH, Tormey DC, et al. [phone]). "Toxicity and response criteria of the [facility]". [place] Oncol. 5 (6): 649-55    LABORATORY DATA:  Lab Results  Component Value Date   WBC 8.0 09/24/2019   HGB 10.9 (L) 09/24/2019   HCT 33.7 (L) 09/24/2019   MCV 79.9 (L) 09/24/2019   PLT 199 09/24/2019   Lab Results  Component Value Date   NA 122 (L) 09/24/2019   K 4.7 09/24/2019   CL 88 (L) 09/24/2019   CO2 17 (L) 09/24/2019   Lab Results  Component Value Date   ALT 14 09/24/2019   AST 25 09/24/2019   ALKPHOS 172 (H) 09/24/2019   BILITOT 0.6 09/24/2019      RADIOGRAPHY: CT Head Wo Contrast  Result Date: 09/23/2019 CLINICAL DATA:  Status post trauma. EXAM: CT HEAD WITHOUT CONTRAST TECHNIQUE: Contiguous axial images were obtained from the base of the skull through the vertex without intravenous contrast. COMPARISON:  None. FINDINGS: Brain: There is mild cerebral atrophy with widening of the extra-axial spaces and ventricular dilatation. There are areas of  decreased attenuation within the white matter tracts of the supratentorial brain, consistent with microvascular disease changes. Vascular: No hyperdense vessel or unexpected calcification. Skull: Normal. Negative for fracture or focal lesion. Sinuses/Orbits: There is moderate severity right maxillary sinus mucosal thickening. Other: None. IMPRESSION: 1. No acute intracranial abnormality. 2. Mild cerebral atrophy and microvascular disease changes of the supratentorial brain. 3. Moderate severity right maxillary sinus mucosal thickening. The presence of a large polyp versus mucous retention cyst cannot be excluded. Electronically Signed   By: Virgina Norfolk M.D.   On: 09/23/2019 20:38   CT Angio Chest PE W/Cm &/Or Wo Cm  Result Date: 09/23/2019 CLINICAL DATA:  Shortness of breath and cough for the past week. Blood tinged sputum. EXAM: CT ANGIOGRAPHY CHEST WITH CONTRAST TECHNIQUE: Multidetector CT imaging of the chest was performed using the standard protocol during bolus administration of  intravenous contrast. Multiplanar CT image reconstructions and MIPs were obtained to evaluate the vascular anatomy. CONTRAST:  86mL OMNIPAQUE IOHEXOL 350 MG/ML SOLN COMPARISON:  Chest x-ray from same day. FINDINGS: Cardiovascular: Satisfactory opacification of the pulmonary arteries to the segmental level. Acute segmental pulmonary emboli in the left lower lobe. Severe stenosis of the distal right pulmonary artery. Normal heart size. No pericardial effusion. Severe stenosis of the SVC with multiple mediastinal and right chest wall collateral veins, and prominent enhancement of the azygos system. No thoracic aortic aneurysm. Mediastinum/Nodes: No enlarged mediastinal, hilar, or axillary lymph nodes. Thyroid gland, trachea, and esophagus demonstrate no significant findings. Lungs/Pleura: Large ill-defined right hilar and suprahilar mass with narrowing of the right upper, middle, and lower lobe bronchi. Complete collapse of the right middle lobe. Perilymphatic nodularity and innumerable small pulmonary nodules in both lungs. Moderate loculated right pleural effusion. Small left pleural effusion. Upper Abdomen: Enhancement of the anterior left hepatic lobe. Punctate nonobstructive left renal calculus. Musculoskeletal: Sclerosis of the lateral right ninth rib and left aspect of the sternal body. Sclerotic lesions involving the T3, T6, T7, T9, T10, and T12 vertebral bodies. Review of the MIP images confirms the above findings. IMPRESSION: 1. Acute segmental pulmonary emboli in the left lower lobe. 2. Widely metastatic lung cancer. Large ill-defined right hilar and suprahilar mass with bilateral pulmonary parenchymal metastases and lymphangitic carcinomatosis. Multiple osseous metastases. 3. The dominant mass results in severe stenosis of the SVC with multiple mediastinal and right chest wall collateral veins, prominent filling of the azygos system and IVC, as well as enhancement of the anterior left hepatic lobe.  Correlate for signs and symptoms of SVC syndrome. 4. Similar resultant severe stenosis of the distal right pulmonary artery and narrowing of the right upper, middle, and lower lobe bronchi with complete collapse of the right middle lobe. 5. Moderate loculated right pleural effusion. Small left pleural effusion. Critical Value/emergent results were called by telephone at the time of interpretation on 09/23/2019 at 8:59 pm to provider Claiborne Billings PA, who verbally acknowledged these results. Electronically Signed   By: Titus Dubin M.D.   On: 09/23/2019 21:08   DG Chest Portable 1 View  Result Date: 09/23/2019 CLINICAL DATA:  Shortness of breath and cough for 1 week, productive blood-tinged sputum EXAM: PORTABLE CHEST 1 VIEW COMPARISON:  None FINDINGS: There is a complex right pleural effusion tracking within the fissures and along the lung apex, worrisome for loculation. Adjacent opacity likely reflect some passive atelectasis though an underlying consolidative processes not excluded. No pneumothorax. Left lung is essentially clear. The aorta is calcified. Right heart border is obscured by adjacent opacity.  Remaining cardiomediastinal contours are unremarkable. Degenerative changes are present in the imaged spine and shoulders. No acute osseous or soft tissue abnormality. IMPRESSION: 1. Findings worrisome for complex right pleural effusion. Underlying consolidative processes not excluded. Could consider CT chest with contrast for further characterization. Electronically Signed   By: Lovena Le M.D.   On: 09/23/2019 05:36       IMPRESSION/PLAN: 1. 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 I called to introduce myself to the patient and we discussed the imaging findings and concerns for probable Stage IV lung cancer. We discussed the urgency of radiotherapy given her SVC Syndrome. She will go for biopsy tomorrow morning.  We discussed the risks, benefits, short, and long term effects of  radiotherapy, and the patient is interested in proceeding.  During our consultation we were also able to coordinate her transport via [place] to [facility] to our department to proceed with simulation.  We spoke more about the delivery and logistics of treatment.  She is given permission for [place] to speak to her brother and sons.  Written consent is obtained and placed in the chart, a copy was provided to the patient.  She will simulate this afternoon and we anticipate starting her treatment tomorrow afternoon following, confirmation of her biopsy.  Dr. Lisbeth Renshaw recommends a course of 10 fractions of treatment, we would also recommend that she receive several fractions prior to being discharged to reduce the chances of rebound inflammatory changes that could worsen her breathing.  We also discussed the rationale for continuing the process of her staging work-up, she will need an outpatient PET scan, and when she is able to more comfortably lay flat we will also perform a screening brain MRI scan.  I have also spoken with her son Leitha Schuller who is in agreement with this plan.  We tried to include his brother [name] in the conversation but were unsuccessful.  They were given contact information if they have any questions or concerns regarding radiotherapy, they are aware of the sense of urgency about her medical condition.  In a visit lasting 90 minutes, greater than 50% of the time was spent face to face discussing her case, notifying her family, and in floor time coordinating the patient's care.     Carola Rhine, PAC

## 2019-09-24 NOTE — Plan of Care (Signed)
Continue to monitor

## 2019-09-24 NOTE — Consult Note (Signed)
Reason for Consult:Lung mass Referring Physician: Dr. Lurena Nida Hyneman is an 79 y.o. female.  HPI: 79 yo woman with a past history of uterine cancer, hysterectomy, remote tobacco abuse(quit ~30 years ago) presents with shortness of breath after syncopal spell. She has been feeling poorly for some time with decreased appetite and 30 pound weight loss over past 3 months. She is a poor historian but primary concern was a syncopal spell either yesterday or the day before after getting out of shower. She says it took her about 4 hours to get off the floor afterwards. She has had a persistent cough for the past few weeks with some hemoptysis ("specks of blood") over the past 2 weeks.  On arrival to ED a CT angio showed a right upper lobe mass with bilateral parenchymal mets and evidence of lymphangitic spread along with multiple bone metastases. 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 Segmental PE in left lower lobe and a loculated right pleural effusion. She was started on heparin.  She currently is not SOB at rest but does get SOB with minimal activity. Has not noticed HA, visual changes, or swelling in arms or face.  Past Medical History:  Diagnosis Date  . Uterine cancer [facility])     Past Surgical History:  Procedure Laterality Date  . ABDOMINAL HYSTERECTOMY      Family History  Problem Relation Age of Onset  . Cancer Neg Hx     Social History:  reports that she has quit smoking. She has never used smokeless tobacco. She reports previous alcohol use. She reports that she does not use drugs.  Allergies: No Known Allergies  Medications:  Scheduled: . sodium chloride flush  3 mL Intravenous Once    Results for orders placed or performed during the hospital encounter of 09/23/19 (from the past 48 hour(s))  Basic metabolic panel     Status: Abnormal   Collection Time: 09/23/19  5:07 AM  Result Value Ref Range   Sodium 121 (L) 135 - 145 mmol/L   Potassium 4.4  3.5 - 5.1 mmol/L   Chloride 87 (L) 98 - 111 mmol/L   CO2 21 (L) 22 - 32 mmol/L   Glucose, Bld 103 (H) 70 - 99 mg/dL   BUN 15 8 - 23 mg/dL   Creatinine, Ser 0.86 0.44 - 1.00 mg/dL   Calcium 9.1 8.9 - 10.3 mg/dL   GFR calc non Af Amer >60 >60 mL/min   GFR calc Af Amer >60 >60 mL/min   Anion gap 13 5 - 15    Comment: Performed at [facility], [place] [address] [place], [place] [address]  CBC     Status: Abnormal   Collection Time: 09/23/19  5:07 AM  Result Value Ref Range   WBC 7.1 4.0 - 10.5 K/uL   RBC 4.11 3.87 - 5.11 MIL/uL   Hemoglobin 11.0 (L) 12.0 - 15.0 g/dL   HCT 33.0 (L) 36.0 - 46.0 %   MCV 80.3 80.0 - 100.0 fL   MCH 26.8 26.0 - 34.0 pg   MCHC 33.3 30.0 - 36.0 g/dL   RDW 15.1 11.5 - 15.5 %   Platelets 222 150 - 400 K/uL   nRBC 0.0 0.0 - 0.2 %    Comment: Performed at [facility], [place] [address], [place], [place] [address]  Troponin I (High Sensitivity)     Status: Abnormal   Collection Time: 09/23/19  5:07 AM  Result Value Ref  Range   Troponin I (High Sensitivity) 25 (H) <18 ng/L    Comment: (NOTE) Elevated high sensitivity troponin I (hsTnI) values and significant  changes across serial measurements may suggest ACS but many other  chronic and acute conditions are known to elevate hsTnI results.  Refer to the "Links" section for chest pain algorithms and additional  guidance. Performed at [facility], [place] [address] [place], [place] [address]   POC SARS Coronavirus 2 Ag-ED - Nasal Swab (BD Veritor Kit)     Status: None   Collection Time: 09/23/19  5:32 AM  Result Value Ref Range   SARS Coronavirus 2 Ag NEGATIVE NEGATIVE    Comment: (NOTE) SARS-CoV-2 antigen NOT DETECTED.  Negative results are presumptive.  Negative results do not preclude SARS-CoV-2 infection and should not be used as the sole basis for treatment or other patient management decisions, including infection  control decisions, particularly in the presence of clinical signs and   symptoms consistent with COVID-19, or in those who have been in contact with the virus.  Negative results must be combined with clinical observations, patient history, and epidemiological information. The expected result is Negative. Fact Sheet for Patients: PodPark.tn Fact Sheet for Healthcare Providers: GiftContent.is This test is not yet approved or cleared by the [place] FDA and  has been authorized for detection and/or diagnosis of SARS-CoV-2 by FDA under an Emergency Use Authorization (EUA).  This EUA will remain in effect (meaning this test can be used) for the duration of  the COVID-19 de claration under Section 564(b)(1) of the Act, 21 U.S.C. section 360bbb-3(b)(1), unless the authorization is terminated or revoked sooner.   Troponin I (High Sensitivity)     Status: Abnormal   Collection Time: 09/23/19  2:30 PM  Result Value Ref Range   Troponin I (High Sensitivity) 23 (H) <18 ng/L    Comment: (NOTE) Elevated high sensitivity troponin I (hsTnI) values and significant  changes across serial measurements may suggest ACS but many other  chronic and acute conditions are known to elevate hsTnI results.  Refer to the "Links" section for chest pain algorithms and additional  guidance. Performed at [facility], [place] [address] [place], [place] [address]   SARS CORONAVIRUS 2 (TAT 6-24 HRS) Nasopharyngeal Nasopharyngeal Swab     Status: None   Collection Time: 09/23/19  2:51 PM   Specimen: Nasopharyngeal Swab  Result Value Ref Range   SARS Coronavirus 2 NEGATIVE NEGATIVE    Comment: (NOTE) SARS-CoV-2 target nucleic acids are NOT DETECTED. The SARS-CoV-2 RNA is generally detectable in upper and lower respiratory specimens during the acute phase of infection. Negative results do not preclude SARS-CoV-2 infection, do not rule out co-infections with other pathogens, and should not be used as the sole basis for  treatment or other patient management decisions. Negative results must be combined with clinical observations, patient history, and epidemiological information. The expected result is Negative. Fact Sheet for Patients: SugarRoll.be Fact Sheet for Healthcare Providers: https://www.woods-mathews.com/ This test is not yet approved or cleared by the [place] FDA and  has been authorized for detection and/or diagnosis of SARS-CoV-2 by FDA under an Emergency Use Authorization (EUA). This EUA will remain  in effect (meaning this test can be used) for the duration of the COVID-19 declaration under Section 56 4(b)(1) of the Act, 21 U.S.C. section 360bbb-3(b)(1), unless the authorization is terminated or revoked sooner. Performed at [facility], [place] [address], [place], [place] [address]  CBC     Status: Abnormal   Collection Time: 09/24/19  5:00 AM  Result Value Ref Range   WBC 8.0 4.0 - 10.5 K/uL   RBC 4.22 3.87 - 5.11 MIL/uL   Hemoglobin 10.9 (L) 12.0 - 15.0 g/dL   HCT 33.7 (L) 36.0 - 46.0 %   MCV 79.9 (L) 80.0 - 100.0 fL   MCH 25.8 (L) 26.0 - 34.0 pg   MCHC 32.3 30.0 - 36.0 g/dL   RDW 15.1 11.5 - 15.5 %   Platelets 199 150 - 400 K/uL   nRBC 0.0 0.0 - 0.2 %    Comment: Performed at [facility], [place] [address], [place], [place] [address]  Comprehensive metabolic panel     Status: Abnormal   Collection Time: 09/24/19  5:00 AM  Result Value Ref Range   Sodium 122 (L) 135 - 145 mmol/L   Potassium 4.7 3.5 - 5.1 mmol/L   Chloride 88 (L) 98 - 111 mmol/L   CO2 17 (L) 22 - 32 mmol/L   Glucose, Bld 95 70 - 99 mg/dL   BUN 18 8 - 23 mg/dL   Creatinine, Ser 0.83 0.44 - 1.00 mg/dL   Calcium 9.2 8.9 - 10.3 mg/dL   Total Protein 7.1 6.5 - 8.1 g/dL   Albumin 3.1 (L) 3.5 - 5.0 g/dL   AST 25 15 - 41 U/L   ALT 14 0 - 44 U/L   Alkaline Phosphatase 172 (H) 38 - 126 U/L   Total Bilirubin 0.6 0.3 - 1.2 mg/dL   GFR calc non Af Amer >60 >60  mL/min   GFR calc Af Amer >60 >60 mL/min   Anion gap 17 (H) 5 - 15    Comment: Performed at [facility], [place] [address] [place], [place] [address]  Cortisol     Status: None   Collection Time: 09/24/19  5:00 AM  Result Value Ref Range   Cortisol, Plasma 30.9 ug/dL    Comment: (NOTE) AM    6.7 - 22.6 ug/dL PM   <10.0       ug/dL Performed at [place] [address] [place], [place] [address]   TSH     Status: None   Collection Time: 09/24/19  5:00 AM  Result Value Ref Range   TSH 3.807 0.350 - 4.500 uIU/mL    Comment: Performed by a 3rd Generation assay with a functional sensitivity of <=0.01 uIU/mL. Performed at [facility], [place] [address], [place], [place] [address]   Troponin I (High Sensitivity)     Status: Abnormal   Collection Time: 09/24/19  5:00 AM  Result Value Ref Range   Troponin I (High Sensitivity) 35 (H) <18 ng/L    Comment: (NOTE) Elevated high sensitivity troponin I (hsTnI) values and significant  changes across serial measurements may suggest ACS but many other  chronic and acute conditions are known to elevate hsTnI results.  Refer to the "Links" section for chest pain algorithms and additional  guidance. Performed at [facility], [place] [address], [place], [place] [address]   Heparin level (unfractionated)     Status: None   Collection Time: 09/24/19  6:05 AM  Result Value Ref Range   Heparin Unfractionated 0.64 0.30 - 0.70 IU/mL    Comment: (NOTE) If heparin results are below expected values, and patient dosage has  been confirmed, suggest follow up testing of antithrombin III levels. Performed at [facility],  [address] [place], [place] [address]   Osmolality     Status: Abnormal   Collection Time: 09/24/19  6:05 AM  Result Value Ref Range   Osmolality 259 (L) 275 - 295 mOsm/kg    Comment: Performed at [facility], [place] [address], [place], [place] [address]    CT Head Wo Contrast  Result Date:  09/23/2019 CLINICAL DATA:  Status post trauma. EXAM: CT HEAD WITHOUT CONTRAST TECHNIQUE: Contiguous axial images were obtained from the base of the skull through the vertex without intravenous contrast. COMPARISON:  None. FINDINGS: Brain: There is mild cerebral atrophy with widening of the extra-axial spaces and ventricular dilatation. There are areas of decreased attenuation within the white matter tracts of the supratentorial brain, consistent with microvascular disease changes. Vascular: No hyperdense vessel or unexpected calcification. Skull: Normal. Negative for fracture or focal lesion. Sinuses/Orbits: There is moderate severity right maxillary sinus mucosal thickening. Other: None. IMPRESSION: 1. No acute intracranial abnormality. 2. Mild cerebral atrophy and microvascular disease changes of the supratentorial brain. 3. Moderate severity right maxillary sinus mucosal thickening. The presence of a large polyp versus mucous retention cyst cannot be excluded. Electronically Signed   By: Virgina Norfolk M.D.   On: 09/23/2019 20:38   CT Angio Chest PE W/Cm &/Or Wo Cm  Result Date: 09/23/2019 CLINICAL DATA:  Shortness of breath and cough for the past week. Blood tinged sputum. EXAM: CT ANGIOGRAPHY CHEST WITH CONTRAST TECHNIQUE: Multidetector CT imaging of the chest was performed using the standard protocol during bolus administration of intravenous contrast. Multiplanar CT image reconstructions and MIPs were obtained to evaluate the vascular anatomy. CONTRAST:  19m OMNIPAQUE IOHEXOL 350 MG/ML SOLN COMPARISON:  Chest x-ray from same day. FINDINGS: Cardiovascular: Satisfactory opacification of the pulmonary arteries to the segmental level. Acute segmental pulmonary emboli in the left lower lobe. Severe stenosis of the distal right pulmonary artery. Normal heart size. No pericardial effusion. 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 No thoracic aortic aneurysm. Mediastinum/Nodes: No enlarged mediastinal, hilar, or axillary lymph nodes. Thyroid gland, trachea, and esophagus demonstrate no significant findings. Lungs/Pleura: Large ill-defined right hilar and suprahilar mass with narrowing of the right upper, middle, and lower lobe bronchi. Complete collapse of the right middle lobe. Perilymphatic nodularity and innumerable small pulmonary nodules in both lungs. Moderate loculated right pleural effusion. Small left pleural effusion. Upper Abdomen: Enhancement of the anterior left hepatic lobe. Punctate nonobstructive left renal calculus. Musculoskeletal: Sclerosis of the lateral right ninth rib and left aspect of the sternal body. Sclerotic lesions involving the T3, T6, T7, T9, T10, and T12 vertebral bodies. Review of the MIP images confirms the above findings. IMPRESSION: 1. Acute segmental pulmonary emboli in the left lower lobe. 2. Widely metastatic lung cancer. Large ill-defined right hilar and suprahilar mass with bilateral pulmonary parenchymal metastases and lymphangitic carcinomatosis. Multiple osseous metastases. 3. The dominant mass results in severe stenosis of the SVC with multiple mediastinal and right chest wall collateral veins, prominent filling of the azygos system and IVC, as well as enhancement of the anterior left hepatic lobe. Correlate for signs and symptoms of SVC syndrome. 4. Similar resultant severe stenosis of the distal right pulmonary artery and narrowing of the right upper, middle, and lower lobe bronchi with complete collapse of the right middle lobe. 5. Moderate loculated right pleural effusion. Small left pleural effusion. Critical Value/emergent results were called by telephone at  the time of interpretation on 09/23/2019 at 8:59 pm to provider Claiborne Billings PA, who verbally acknowledged these results. Electronically Signed   By: Titus Dubin M.D.   On: 09/23/2019 21:08   DG Chest Portable 1  View  Result Date: 09/23/2019 CLINICAL DATA:  Shortness of breath and cough for 1 week, productive blood-tinged sputum EXAM: PORTABLE CHEST 1 VIEW COMPARISON:  None FINDINGS: There is a complex right pleural effusion tracking within the fissures and along the lung apex, worrisome for loculation. Adjacent opacity likely reflect some passive atelectasis though an underlying consolidative processes not excluded. No pneumothorax. Left lung is essentially clear. The aorta is calcified. Right heart border is obscured by adjacent opacity. Remaining cardiomediastinal contours are unremarkable. Degenerative changes are present in the imaged spine and shoulders. No acute osseous or soft tissue abnormality. IMPRESSION: 1. Findings worrisome for complex right pleural effusion. Underlying consolidative processes not excluded. Could consider CT chest with contrast for further characterization. Electronically Signed   By: Lovena Le M.D.   On: 09/23/2019 05:36    Review of Systems  Constitutional: Positive for activity change, appetite change and unexpected weight change.  Respiratory: Positive for cough and shortness of breath.   Cardiovascular: Negative for chest pain and leg swelling.  Neurological: Positive for syncope.   Blood pressure (!) 146/77, pulse 92, temperature 97.9 F (36.6 C), temperature source Oral, resp. rate (!) 22, height _0  (1.702 m), weight 74.8 kg, SpO2 95 %. Physical Exam  Vitals reviewed. Constitutional: She is oriented to person, place, and time. She appears well-developed and well-nourished. No distress.  HENT:  Head: Normocephalic and atraumatic.  Eyes: EOM are normal. No scleral icterus.  Neck: No thyromegaly present.  Cardiovascular: Normal rate, regular rhythm and normal heart sounds.  Respiratory: No respiratory distress.  Absent BS right base  GI: Soft. She exhibits no distension. There is no abdominal tenderness.  Musculoskeletal:        General: No edema.   Lymphadenopathy:    She has no cervical adenopathy.  Neurological: She is alert and oriented to person, place, and time.  Skin: Skin is warm and dry.    Assessment/Plan: 79 yo woman with a remote history of smoking and uterine cancer treated years ago who presents with syncope, SOB, weight loss and general malaise. Workup shows a right upper lobe mass with adenopathy, narrowing of SVC with collaterals, a loculated pleural effusion and pulmonary emboli. Also evidence of pulmonary and bone metastases and lymphangitic spread. Findings are c/w stage IV lung cancer.  She needs a tissue diagnosis to guide therapy.  I recommended we proceed with bronchoscopy and endobronchial ultrasound. I described the general nature of the procedure to her including the need for general anesthesia. She understands this is diagnostic not therapeutic. I informed her of the indications, risks, benefits and alternatives. She understands the risks include but are not limited to death, MI, blood clots, bleeding, loss of airway, and failure to make a diagnosis, as well as other unforeseeable complications.  She has been fully heparinized for her PE. Will need to hold heparin for 6 hours prior to procedure. Given that she is currently not in distress and has good sats on room air will plan to do bronch/ EBUS in AM 1/7. Hold heparin at midnight.  Melrose Nakayama 09/24/2019, 8:35 AM

## 2019-09-24 NOTE — ED Notes (Signed)
Called [facility] for transport to [facility] for appointment at 1300 in the radiation department.

## 2019-09-24 NOTE — Progress Notes (Signed)
ANTICOAGULATION CONSULT NOTE  Pharmacy Consult for heparin Indication: pulmonary embolus   Patient Measurements: Heparin Dosing Weight: 74 kg  Vital Signs: BP: 128/65 (01/06 1300) Pulse Rate: 92 (01/06 1300)  Labs: Recent Labs    09/23/19 0507 09/23/19 1430 09/24/19 0500 09/24/19 0605 09/24/19 1308  HGB 11.0*  --  10.9*  --   --   HCT 33.0*  --  33.7*  --   --   PLT 222  --  199  --   --   HEPARINUNFRC  --   --   --  0.64 0.86*  CREATININE 0.86  --  0.83  --   --   TROPONINIHS 25* 23* 35*  --   --    Assessment: 79 y.o. female continues on IV heparin for PE. Heparin level is now supratherapeutic at 0.86. No bleeding noted.   Goal of Therapy:  Heparin level 0.3-0.7 units/ml Monitor platelets by anticoagulation protocol: Yes   Plan:  Reduce heparin gtt to 1050 units/hr Check an 8 hr heparin level Daily heparin level and CBC  Amitai Delaughter, Rande Lawman 09/24/2019,2:06 PM

## 2019-09-24 NOTE — Progress Notes (Signed)
PROGRESS NOTE    Emily Kim   BJS:[id number]  DOB: July 24, 1941  DOA: 09/23/2019 PCP: Patient, No Pcp Per   Brief Narrative:  Emily Kim  is a 79 y.o. female,  w hx of uterine cancer s/p hysterectomy [place], [place]), apparently notes 50lbs of weight loss over the past 3 month.  He presents for shortness of breath and hemoptysis intermittently over the past 2 weeks.   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 No other complaints.    Assessment & Plan:   Principal Problem:   Pulmonary emboli with dyspnea on exertion -Started on heparin  Active Problems: Metastatic lung cancer with lymphangitic spread, bone metastasis-loculated right pleural effusion likely represents a malignant effusion Weight loss 50 pound weight loss -Need oncology consult-sputum cytology x3 recommended-they will consider palliative radiation to primary -Surgery consulted-recommending bronchoscopy and endobronchial ultrasound on 1/7 after holding heparin for 6 hours  Severe hyponatremia -Serum osmolality is 259 -Urine sodium and urine osmolality ordered yesterday but have not yet been sent- patient  urinated about 1 hr ago into a bedpan  Severe SVC stenosis with collaterals - swelling of face noted  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   Time spent in minutes: 35 DVT prophylaxis: Heparin Code Status: Full code Family Communication:  Disposition Plan: Bronch  tomorrow Consultants:   Hematology  CT surgery Procedures:   none Antimicrobials:  Anti-infectives (From admission, onward)   None       Objective: Vitals:   09/24/19 1130 09/24/19 1200 09/24/19 1300 09/24/19 1558  BP: (!) 120/96 (!) 144/73 128/65 (!) 146/73  Pulse:   92 95  Resp: (!) 21 17 (!) 23 19  Temp:      TempSrc:    Oral  SpO2:   96% 99%  Weight:      Height:       No intake or output data in the 24 hours ending 09/24/19 1608 Filed Weights   09/23/19 2100  Weight: 74.8 kg    Examination: General exam: Appears comfortable  HEENT: PERRLA, oral mucosa moist, no sclera icterus or thrush Respiratory system: Clear to auscultation. Respiratory effort normal. Tachypnea with RR in 30s Cardiovascular system: S1 & S2 heard, RRR.   Gastrointestinal system: Abdomen soft, non-tender, nondistended. Normal bowel sounds. Central nervous system: Alert and oriented. No focal neurological deficits. Extremities: No cyanosis, clubbing or edema Skin: No rashes or ulcers Psychiatry:  Mood & affect appropriate.    Data Reviewed: I have personally reviewed following labs and imaging studies  CBC: Recent Labs  Lab 09/23/19 0507 09/24/19 0500  WBC 7.1 8.0  HGB 11.0* 10.9*  HCT 33.0* 33.7*  MCV 80.3 79.9*  PLT 222 607   Basic Metabolic Panel: Recent Labs  Lab 09/23/19 0507 09/24/19 0500  NA 121* 122*  K 4.4 4.7  CL 87* 88*  CO2 21* 17*  GLUCOSE 103* 95  BUN 15 18  CREATININE 0.86 0.83  CALCIUM 9.1 9.2   GFR: Estimated Creatinine Clearance: 59 mL/min (by C-G formula based  on SCr of 0.83 mg/dL). Liver Function Tests: Recent Labs  Lab 09/24/19 0500  AST 25  ALT 14  ALKPHOS 172*  BILITOT 0.6  PROT 7.1  ALBUMIN 3.1*   No results for input(s): LIPASE, AMYLASE in the last 168 hours. No results for input(s): AMMONIA in the last 168 hours. Coagulation Profile: No results for input(s): INR, PROTIME in the last 168 hours. Cardiac Enzymes: No results for  input(s): CKTOTAL, CKMB, CKMBINDEX, TROPONINI in the last 168 hours. BNP (last 3 results) No results for input(s): PROBNP in the last 8760 hours. HbA1C: No results for input(s): HGBA1C in the last 72 hours. CBG: No results for input(s): GLUCAP in the last 168 hours. Lipid Profile: No results for input(s): CHOL, HDL, LDLCALC, TRIG, CHOLHDL, LDLDIRECT in the last 72 hours. Thyroid Function Tests: Recent Labs    09/24/19 0500  TSH 3.807   Anemia Panel: No results for input(s): VITAMINB12, FOLATE, FERRITIN, TIBC, IRON, RETICCTPCT in the last 72 hours. Urine analysis: No results found for: COLORURINE, APPEARANCEUR, LABSPEC, PHURINE, GLUCOSEU, HGBUR, BILIRUBINUR, KETONESUR, PROTEINUR, UROBILINOGEN, NITRITE, LEUKOCYTESUR Sepsis Labs: @LABRCNTIP (procalcitonin:4,lacticidven:4) ) Recent Results (from the past 240 hour(s))  SARS CORONAVIRUS 2 (TAT 6-24 HRS) Nasopharyngeal Nasopharyngeal Swab     Status: None   Collection Time: 09/23/19  2:51 PM   Specimen: Nasopharyngeal Swab  Result Value Ref Range Status   SARS Coronavirus 2 NEGATIVE NEGATIVE Final    Comment: (NOTE) SARS-CoV-2 target nucleic acids are NOT DETECTED. The SARS-CoV-2 RNA is generally detectable in upper and lower respiratory specimens during the acute phase of infection. Negative results do not preclude SARS-CoV-2 infection, do not rule out co-infections with other pathogens, and should not be used as the sole basis for treatment or other patient management decisions. Negative results must be combined with clinical observations, patient history, and epidemiological information. The expected result is Negative. Fact Sheet for Patients: SugarRoll.be Fact Sheet for Healthcare Providers: https://www.woods-mathews.com/ This test is not yet approved or cleared by the [place] FDA and  has been authorized for detection and/or diagnosis of SARS-CoV-2 by FDA under an Emergency Use  Authorization (EUA). This EUA will remain  in effect (meaning this test can be used) for the duration of the COVID-19 declaration under Section 56 4(b)(1) of the Act, 21 U.S.C. section 360bbb-3(b)(1), unless the authorization is terminated or revoked sooner. Performed at [facility], [place] [address] [place], [place] [address]          Radiology Studies: CT Head Wo Contrast  Result Date: 09/23/2019 CLINICAL DATA:  Status post trauma. EXAM: CT HEAD WITHOUT CONTRAST TECHNIQUE: Contiguous axial images were obtained from the base of the skull through the vertex without intravenous contrast. COMPARISON:  None. FINDINGS: Brain: There is mild cerebral atrophy with widening of the extra-axial spaces and ventricular dilatation. There are areas of decreased attenuation within the white matter tracts of the supratentorial brain, consistent with microvascular disease changes. Vascular: No hyperdense vessel or unexpected calcification. Skull: Normal. Negative for fracture or focal lesion. Sinuses/Orbits: There is moderate severity right maxillary sinus mucosal thickening. Other: None. IMPRESSION: 1. No acute intracranial abnormality. 2. Mild cerebral atrophy and microvascular disease changes of the supratentorial brain. 3. Moderate severity right maxillary sinus mucosal thickening. The presence of a large polyp versus mucous retention cyst cannot be excluded. Electronically Signed   By: Virgina Norfolk M.D.   On: 09/23/2019 20:38   CT Angio Chest PE W/Cm &/Or Wo Cm  Result Date: 09/23/2019 CLINICAL DATA:  Shortness of breath and cough for the past week. Blood tinged sputum. EXAM: CT ANGIOGRAPHY CHEST WITH CONTRAST TECHNIQUE: Multidetector CT imaging of the chest was performed using the standard protocol during bolus administration of intravenous contrast. Multiplanar CT image reconstructions and MIPs were obtained to evaluate the vascular anatomy. CONTRAST:  64mL OMNIPAQUE IOHEXOL 350 MG/ML SOLN  COMPARISON:  Chest x-ray from same day. FINDINGS: Cardiovascular: Satisfactory opacification of the pulmonary arteries to the segmental level. Acute segmental pulmonary emboli in the left lower lobe. Severe stenosis of the distal right pulmonary artery. Normal heart size. No pericardial effusion. Severe stenosis of the SVC with multiple mediastinal and right chest wall collateral veins, and prominent enhancement of the azygos system. No thoracic aortic aneurysm. Mediastinum/Nodes: No enlarged mediastinal, hilar, or axillary lymph nodes. Thyroid gland, trachea, and esophagus demonstrate no significant findings. Lungs/Pleura: Large ill-defined right hilar and suprahilar mass with narrowing of the right upper, middle, and lower lobe bronchi. Complete collapse of the right middle lobe. Perilymphatic nodularity and innumerable small pulmonary nodules in both lungs. Moderate loculated right pleural effusion. Small left pleural effusion. Upper Abdomen: Enhancement of the anterior left hepatic lobe. Punctate nonobstructive left renal calculus. Musculoskeletal: Sclerosis of the lateral right ninth rib and left aspect of the sternal body. Sclerotic lesions involving the T3, T6, T7, T9, T10, and T12 vertebral bodies. Review of the MIP images confirms the above findings. IMPRESSION: 1. Acute segmental pulmonary emboli in the left lower lobe. 2. Widely metastatic lung cancer. Large ill-defined right hilar and suprahilar mass with bilateral pulmonary parenchymal metastases and lymphangitic carcinomatosis. Multiple osseous metastases. 3. The dominant mass results in severe stenosis of the SVC with multiple mediastinal and right chest wall collateral veins, prominent filling of the azygos system and IVC, as well as enhancement of the anterior left hepatic lobe. Correlate for signs and symptoms of SVC syndrome. 4. Similar resultant severe stenosis of the distal right pulmonary artery and narrowing of the right upper, middle, and  lower lobe bronchi with complete collapse of the right middle lobe. 5. Moderate loculated right pleural effusion. Small left pleural effusion. Critical Value/emergent results were called by telephone at the time of interpretation on 09/23/2019 at 8:59 pm to provider Claiborne Billings PA, who verbally acknowledged these results. Electronically Signed   By: Titus Dubin M.D.   On: 09/23/2019 21:08   DG Chest Portable 1 View  Result Date: 09/23/2019 CLINICAL DATA:  Shortness of breath and cough for 1 week, productive blood-tinged sputum EXAM: PORTABLE CHEST 1 VIEW COMPARISON:  None FINDINGS: There is a complex right pleural effusion tracking within the fissures and along the lung apex, worrisome for loculation. Adjacent opacity likely reflect some passive atelectasis though an underlying consolidative processes not excluded. No pneumothorax. Left lung is essentially clear. The aorta is calcified. Right heart border is obscured by adjacent opacity. Remaining cardiomediastinal contours are unremarkable. Degenerative changes are present in the imaged spine and shoulders. No acute osseous or soft tissue abnormality. IMPRESSION: 1. Findings worrisome for complex right pleural effusion. Underlying consolidative processes not excluded. Could consider CT chest with contrast for further characterization. Electronically Signed   By: Lovena Le M.D.   On: 09/23/2019 05:36      Scheduled Meds: . sodium chloride flush  3 mL Intravenous Once   Continuous Infusions: . heparin 1,050 Units/hr (09/24/19 1604)     LOS: 1 day      Debbe Odea, MD [facility] Pager: [email or web address] Password [email or web address] 09/24/2019, 4:08 PM

## 2019-09-24 NOTE — Progress Notes (Signed)
[facility] for heparin Indication: pulmonary embolus   Patient Measurements: Heparin Dosing Weight: 74 kg  Vital Signs: BP: 107/88 (01/06 0600) Pulse Rate: 90 (01/06 0600)  Labs: Recent Labs    09/23/19 0507 09/23/19 1430 09/24/19 0500 09/24/19 0605  HGB 11.0*  --  10.9*  --   HCT 33.0*  --  33.7*  --   PLT 222  --  199  --   HEPARINUNFRC  --   --   --  0.64  CREATININE 0.86  --   --   --   TROPONINIHS 25* 23* 35*  --    Assessment: 79 y.o. female with PE for heparin  Goal of Therapy:  Heparin level 0.3-0.7 units/ml Monitor platelets by anticoagulation protocol: Yes   Plan:  Continue Heparin at current rate   Emily Kim, Bronson Curb 09/24/2019,6:58 AM

## 2019-09-24 NOTE — Progress Notes (Signed)
[place]  [phone] [phone]     ID: Emily Kim DOB: [birth year]  MR#: [id number]  IOM#:[id number]  Patient Care Team: Patient, No Pcp Per as PCP - General (General Practice) Chauncey Cruel, MD OTHER MD:  CHIEF COMPLAINT: Clinical diagnosis of metastatic lung cancer  CURRENT TREATMENT: Considering initial palliative radiation   HISTORY OF CURRENT ILLNESS: Emily Kim tells me that she has been having more problems with shortness of breath.  Over the past 2 weeks she has been coughing up phlegm with flecks of blood.  This past weekend on 09/20/2018 she passed out in her home and it took her 4 hours to be able to get up.  She presented to the emergency room 09/22/2018 and as part of her work-up there a chest x-ray showed a complex right pleural effusion.  CT angio 09/22/2018 confirmed acute segmental pulmonary emboli in the left lower lobe.  There was also severe stenosis of the distal right pulmonary artery and of the SVC with multiple collateral veins noted, due to a large ill-defined right hilar and right suprahilar mass.  There are also innumerable small pulmonary nodules bilaterally and a moderate right pleural effusion.  The bone views showed sclerosis of ribs sternum and several vertebral bodies.  We were consulted regarding further follow-up and treatment.  INTERVAL HISTORY: I met with the patient in the emergency department room.  No family or nursing present  REVIEW OF SYSTEMS: The history is as noted above.  The patient denies unusual headaches visual changes nausea vomiting gait imbalance or falls.  She has had no significant change in bowel or bladder habits.  She is not aware of any lower extremity edema.  A detailed review of systems was otherwise noncontributory  PAST MEDICAL HISTORY: Past Medical History:  Diagnosis Date  . Uterine cancer [facility])   The patient's uterine cancer was treated with hysterectomy in 2002 required no  radiation or other treatment.  The patient tells me she had multiple episodes of pneumonia as a child.  PAST SURGICAL HISTORY: Past Surgical History:  Procedure Laterality Date  . ABDOMINAL HYSTERECTOMY      FAMILY HISTORY Family History  Problem Relation Age of Onset  . Cancer Neg Hx   Patient's mother died when the patient was 79 years old.  Apparently the cause was peptic ulcer disease.  The patient has no information on her father or his side of the family.  The patient had 5 siblings.  3 brothers have died, 1 from [place], 1 from suicide, and 1 from liver cancer.  GYNECOLOGIC HISTORY:  No LMP recorded.   SOCIAL HISTORY:  [name] grew up in [place] but moved to [place] when her mother died and was brought up by an aunt.  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  She is widowed, her husband used to work in a Psychologist, counselling.  They had 3 children, none of them living in [place].  Daughter [name], and sons [name] and Emily Kim.  [name] had an automobile accident as a young man and is disabled, living in [place].    ADVANCED DIRECTIVES: Not in place.  At the tip patient tells me if she completes a healthcare power of attorney document she plans to not name her son Emily Kim is healthcare power of attorney, but she did not have his phone number on hand this morning.   HEALTH MAINTENANCE: Social History   Tobacco Use  . Smoking  status: Former Research scientist (life sciences)  . Smokeless tobacco: Never Used  Substance Use Topics  . Alcohol use: Not Currently  . Drug use: Never      No Known Allergies  Current Facility-Administered Medications  Medication Dose Route Frequency Provider Last Rate Last Admin  . acetaminophen (TYLENOL) tablet 650 mg  650 mg Oral Q6H PRN Jani Gravel, MD       Or  . acetaminophen (TYLENOL) suppository 650 mg  650 mg Rectal Q6H PRN Jani Gravel, MD      . heparin ADULT infusion 100 units/mL (25000 units/264m sodium chloride 0.45%)  1,200  Units/hr Intravenous Continuous M[facility]vel [facility] mL/hr at 09/23/19 2221 1,200 Units/hr at 09/23/19 2221  . sodium chloride flush (NS) 0.9 % injection 3 mL  3 mL Intravenous Once SLajean Saver MD       Current Outpatient Medications  Medication Sig Dispense Refill  . OVER THE COUNTER MEDICATION Take 1 tablet by mouth daily. Cold and cough medication      OBJECTIVE: Older African-American woman examined in bed  Vitals:   09/24/19 1030 09/24/19 1100  BP: 138/71   Pulse:  90  Resp: 18 (!) 23  Temp:    SpO2:  98%     Body mass index is 25.84 kg/m.   Wt Readings from Last 3 Encounters:  09/23/19 165 lb (74.8 kg)   Lungs no rales or rhonchi Heart regular rate and rhythm Abd soft, nontender, positive bowel sounds Neuro: non-focal, well-oriented, appropriate affect Breasts: Deferred   LAB RESULTS:  CMP     Component Value Date/Time   NA 122 (L) 09/24/2019 0500   K 4.7 09/24/2019 0500   CL 88 (L) 09/24/2019 0500   CO2 17 (L) 09/24/2019 0500   GLUCOSE 95 09/24/2019 0500   BUN 18 09/24/2019 0500   CREATININE 0.83 09/24/2019 0500   CALCIUM 9.2 09/24/2019 0500   PROT 7.1 09/24/2019 0500   ALBUMIN 3.1 (L) 09/24/2019 0500   AST 25 09/24/2019 0500   ALT 14 09/24/2019 0500   ALKPHOS 172 (H) 09/24/2019 0500   BILITOT 0.6 09/24/2019 0500   GFRNONAA >60 09/24/2019 0500   GFRAA >60 09/24/2019 0500    No results found for: TOTALPROTELP, ALBUMINELP, A1GS, A2GS, BETS, BETA2SER, GAMS, MSPIKE, SPEI  No results found for: KPAFRELGTCHN, LAMBDASER, KAPLAMBRATIO  Lab Results  Component Value Date   WBC 8.0 09/24/2019   HGB 10.9 (L) 09/24/2019   HCT 33.7 (L) 09/24/2019   MCV 79.9 (L) 09/24/2019   PLT 199 09/24/2019    @LASTCHEMISTRY @  No results found for: LABCA2  No components found for: [id number] No results for input(s): INR in the last 168 hours.  No results found for: LABCA2  No results found for: CZJQ734 No results found for: CLPF790 No results found  for: CWIO973 No results found for: CA2729  No components found for: HGQUANT  No results found for: CEA1 / No results found for: CEA1   No results found for: AFPTUMOR  No results found for: [place] No results found for: PSA1  Admission on 09/23/2019  Component Date Value Ref Range Status  . SARS Coronavirus 2 Ag 09/23/2019 NEGATIVE  NEGATIVE Final   Comment: (NOTE) SARS-CoV-2 antigen NOT DETECTED.  Negative results are presumptive.  Negative results do not preclude SARS-CoV-2 infection and should not be used as the sole basis for treatment or other patient management decisions, including infection  control decisions, particularly in the presence of clinical signs and  symptoms consistent with COVID-19, or in those who have been in contact with the virus.  Negative results must be combined with clinical observations, patient history, and epidemiological information. The expected result is Negative. Fact Sheet for Patients: PodPark.tn Fact Sheet for Healthcare Providers: GiftContent.is This test is not yet approved or cleared by the [place] FDA and  has been authorized for detection and/or diagnosis of SARS-CoV-2 by FDA under an Emergency Use Authorization (EUA).  This EUA will remain in effect (meaning this test can be used) for the duration of  the COVID-19 de                          claration under Section 564(b)(1) of the Act, 21 U.S.C. section 360bbb-3(b)(1), unless the authorization is terminated or revoked sooner.   . Sodium 09/23/2019 121* 135 - 145 mmol/L Final  . Potassium 09/23/2019 4.4  3.5 - 5.1 mmol/L Final  . Chloride 09/23/2019 87* 98 - 111 mmol/L Final  . CO2 09/23/2019 21* 22 - 32 mmol/L Final  . Glucose, Bld 09/23/2019 103* 70 - 99 mg/dL Final  . BUN 09/23/2019 15  8 - 23 mg/dL Final  . Creatinine, Ser 09/23/2019 0.86  0.44 - 1.00 mg/dL Final  . Calcium 09/23/2019 9.1  8.9 - 10.3 mg/dL  Final  . GFR calc non Af Amer 09/23/2019 >60  >60 mL/min Final  . GFR calc Af Amer 09/23/2019 >60  >60 mL/min Final  . Anion gap 09/23/2019 13  5 - 15 Final   Performed at [facility], [place] [address] [place], [place] [address]  . WBC 09/23/2019 7.1  4.0 - 10.5 K/uL Final  . RBC 09/23/2019 4.11  3.87 - 5.11 MIL/uL Final  . Hemoglobin 09/23/2019 11.0* 12.0 - 15.0 g/dL Final  . HCT 09/23/2019 33.0* 36.0 - 46.0 % Final  . MCV 09/23/2019 80.3  80.0 - 100.0 fL Final  . MCH 09/23/2019 26.8  26.0 - 34.0 pg Final  . MCHC 09/23/2019 33.3  30.0 - 36.0 g/dL Final  . RDW 09/23/2019 15.1  11.5 - 15.5 % Final  . Platelets 09/23/2019 222  150 - 400 K/uL Final  . nRBC 09/23/2019 0.0  0.0 - 0.2 % Final   Performed at [place] [address], [place], [place] [address]  . Troponin I (High Sensitivity) 09/23/2019 25* <18 ng/L Final   Comment: (NOTE) Elevated high sensitivity troponin I (hsTnI) values and significant  changes across serial measurements may suggest ACS but many other  chronic and acute conditions are known to elevate hsTnI results.  Refer to the "Links" section for chest pain algorithms and additional  guidance. Performed at [facility], [place] [address] [place], [place] [address]   . Troponin I (High Sensitivity) 09/23/2019 23* <18 ng/L Final   Comment: (NOTE) Elevated high sensitivity troponin I (hsTnI) values and significant  changes across serial measurements may suggest ACS but many other  chronic and acute conditions are known to elevate hsTnI results.  Refer to the "Links" section for chest pain algorithms and additional  guidance. Performed at [facility], [place] [address] [place],  [address]   . SARS Coronavirus 2 09/23/2019 NEGATIVE  NEGATIVE Final   Comment: (NOTE) SARS-CoV-2 target nucleic acids are NOT DETECTED. The SARS-CoV-2 RNA is generally detectable in upper and lower respiratory specimens during the acute phase of infection.  Negative results do not preclude SARS-CoV-2 infection, do  not rule out co-infections with other pathogens, and should not be used as the sole basis for treatment or other patient management decisions. Negative results must be combined with clinical observations, patient history, and epidemiological information. The expected result is Negative. Fact Sheet for Patients: SugarRoll.be Fact Sheet for Healthcare Providers: https://www.woods-mathews.com/ This test is not yet approved or cleared by the [place] FDA and  has been authorized for detection and/or diagnosis of SARS-CoV-2 by FDA under an Emergency Use Authorization (EUA). This EUA will remain  in effect (meaning this test can be used) for the duration of the COVID-19 declaration under Section 56                          4(b)(1) of the Act, 21 U.S.C. section 360bbb-3(b)(1), unless the authorization is terminated or revoked sooner. Performed at [facility], [place] [address], [place], [place] [address]   . Heparin Unfractionated 09/24/2019 0.64  0.30 - 0.70 IU/mL Final   Comment: (NOTE) If heparin results are below expected values, and patient dosage has  been confirmed, suggest follow up testing of antithrombin III levels. Performed at [facility], [place] [address], [place], [place] [address]   . WBC 09/24/2019 8.0  4.0 - 10.5 K/uL Final  . RBC 09/24/2019 4.22  3.87 - 5.11 MIL/uL Final  . Hemoglobin 09/24/2019 10.9* 12.0 - 15.0 g/dL Final  . HCT 09/24/2019 33.7* 36.0 - 46.0 % Final  . MCV 09/24/2019 79.9* 80.0 - 100.0 fL Final  . MCH 09/24/2019 25.8* 26.0 - 34.0 pg Final  . MCHC 09/24/2019 32.3  30.0 - 36.0 g/dL Final  . RDW 09/24/2019 15.1  11.5 - 15.5 % Final  . Platelets 09/24/2019 199  150 - 400 K/uL Final  . nRBC 09/24/2019 0.0  0.0 - 0.2 % Final   Performed at [facility], [place] [address] [place], [place] [address]  . Sodium 09/24/2019 122* 135 - 145 mmol/L  Final  . Potassium 09/24/2019 4.7  3.5 - 5.1 mmol/L Final  . Chloride 09/24/2019 88* 98 - 111 mmol/L Final  . CO2 09/24/2019 17* 22 - 32 mmol/L Final  . Glucose, Bld 09/24/2019 95  70 - 99 mg/dL Final  . BUN 09/24/2019 18  8 - 23 mg/dL Final  . Creatinine, Ser 09/24/2019 0.83  0.44 - 1.00 mg/dL Final  . Calcium 09/24/2019 9.2  8.9 - 10.3 mg/dL Final  . Total Protein 09/24/2019 7.1  6.5 - 8.1 g/dL Final  . Albumin 09/24/2019 3.1* 3.5 - 5.0 g/dL Final  . AST 09/24/2019 25  15 - 41 U/L Final  . ALT 09/24/2019 14  0 - 44 U/L Final  . Alkaline Phosphatase 09/24/2019 172* 38 - 126 U/L Final  . Total Bilirubin 09/24/2019 0.6  0.3 - 1.2 mg/dL Final  . GFR calc non Af Amer 09/24/2019 >60  >60 mL/min Final  . GFR calc Af Amer 09/24/2019 >60  >60 mL/min Final  . Anion gap 09/24/2019 17* 5 - 15 Final   Performed at [facility], [place] [address], [place], [place] [address]  . Osmolality 09/24/2019 259* 275 - 295 mOsm/kg Final   Performed at [facility], [place] [address], [place], [place] [address]  . Cortisol, Plasma 09/24/2019 30.9  ug/dL Final   Comment: (NOTE) AM    6.7 - 22.6 ug/dL PM   <10.0       ug/dL Performed at [facility], [place] [address] [place], [place] [address]   . TSH 09/24/2019 3.807  0.350 - 4.500 uIU/mL Final   Comment: Performed by a 3rd Generation assay with a functional sensitivity of <=0.01 uIU/mL. Performed at [facility], [place] [address], [place], [place] [address]   . Troponin I (High Sensitivity) 09/24/2019 35* <18 ng/L Final   Comment: (NOTE) Elevated high sensitivity troponin I (hsTnI) values and significant  changes across serial measurements may suggest ACS but many other  chronic and acute conditions are known to elevate hsTnI results.  Refer to the "Links" section for chest pain algorithms and additional  guidance. Performed at [facility], [place] [address] [place], [place] [address]     (this displays the last labs from the  last 3 days)  No results found for: TOTALPROTELP, ALBUMINELP, A1GS, A2GS, BETS, BETA2SER, GAMS, MSPIKE, SPEI (this displays SPEP labs)  No results found for: KPAFRELGTCHN, LAMBDASER, KAPLAMBRATIO (kappa/lambda light chains)  No results found for: HGBA, HGBA2QUANT, HGBFQUANT, HGBSQUAN (Hemoglobinopathy evaluation)   No results found for: LDH  No results found for: IRON, TIBC, IRONPCTSAT (Iron and TIBC)  No results found for: FERRITIN  Urinalysis No results found for: COLORURINE, APPEARANCEUR, LABSPEC, PHURINE, GLUCOSEU, HGBUR, BILIRUBINUR, KETONESUR, PROTEINUR, UROBILINOGEN, NITRITE, LEUKOCYTESUR   STUDIES: CT Head Wo Contrast  Result Date: 09/23/2019 CLINICAL DATA:  Status post trauma. EXAM: CT HEAD WITHOUT CONTRAST TECHNIQUE: Contiguous axial images were obtained from the base of the skull through the vertex without intravenous contrast. COMPARISON:  None. FINDINGS: Brain: There is mild cerebral atrophy with widening of the extra-axial spaces and ventricular dilatation. There are areas of decreased attenuation within the white matter tracts of the supratentorial brain, consistent with microvascular disease changes. Vascular: No hyperdense vessel or unexpected calcification. Skull: Normal. Negative for fracture or focal lesion. Sinuses/Orbits: There is moderate severity right maxillary sinus mucosal thickening. Other: None. IMPRESSION: 1. No acute intracranial abnormality. 2. Mild cerebral atrophy and microvascular disease changes of the supratentorial brain. 3. Moderate severity right maxillary sinus mucosal thickening. The presence of a large polyp versus mucous retention cyst cannot be excluded. Electronically Signed   By: Virgina Norfolk M.D.   On: 09/23/2019 20:38   CT Angio Chest PE W/Cm &/Or Wo Cm  Result Date: 09/23/2019 CLINICAL DATA:  Shortness of breath and cough for the past week. Blood tinged sputum. EXAM: CT ANGIOGRAPHY CHEST WITH CONTRAST TECHNIQUE: Multidetector CT  imaging of the chest was performed using the standard protocol during bolus administration of intravenous contrast. Multiplanar CT image reconstructions and MIPs were obtained to evaluate the vascular anatomy. CONTRAST:  86m OMNIPAQUE IOHEXOL 350 MG/ML SOLN COMPARISON:  Chest x-ray from same day. FINDINGS: Cardiovascular: Satisfactory opacification of the pulmonary arteries to the segmental level. Acute segmental pulmonary emboli in the left lower lobe. Severe stenosis of the distal right pulmonary artery. Normal heart size. No pericardial effusion. Severe stenosis of the SVC with multiple mediastinal and right chest wall collateral veins, and prominent enhancement of the azygos system. No thoracic aortic aneurysm. Mediastinum/Nodes: No enlarged mediastinal, hilar, or axillary lymph nodes. Thyroid gland, trachea, and esophagus demonstrate no significant findings. Lungs/Pleura: Large ill-defined right hilar and suprahilar mass with narrowing of the right upper, middle, and lower lobe bronchi. Complete collapse of the right middle lobe. Perilymphatic nodularity and innumerable small pulmonary nodules in both lungs. Moderate loculated right pleural effusion. Small left pleural effusion. Upper Abdomen: Enhancement of the anterior left hepatic lobe. Punctate nonobstructive left renal  calculus. Musculoskeletal: Sclerosis of the lateral right ninth rib and left aspect of the sternal body. Sclerotic lesions involving the T3, T6, T7, T9, T10, and T12 vertebral bodies. Review of the MIP images confirms the above findings. IMPRESSION: 1. Acute segmental pulmonary emboli in the left lower lobe. 2. Widely metastatic lung cancer. Large ill-defined right hilar and suprahilar mass with bilateral pulmonary parenchymal metastases and lymphangitic carcinomatosis. Multiple osseous metastases. 3. The dominant mass results in severe stenosis of the SVC with multiple mediastinal and right chest wall collateral veins, prominent filling of  the azygos system and IVC, as well as enhancement of the anterior left hepatic lobe. Correlate for signs and symptoms of SVC syndrome. 4. Similar resultant severe stenosis of the distal right pulmonary artery and narrowing of the right upper, middle, and lower lobe bronchi with complete collapse of the right middle lobe. 5. Moderate loculated right pleural effusion. Small left pleural effusion. Critical Value/emergent results were called by telephone at the time of interpretation on 09/23/2019 at 8:59 pm to provider Claiborne Billings PA, who verbally acknowledged these results. Electronically Signed   By: Titus Dubin M.D.   On: 09/23/2019 21:08   DG Chest Portable 1 View  Result Date: 09/23/2019 CLINICAL DATA:  Shortness of breath and cough for 1 week, productive blood-tinged sputum EXAM: PORTABLE CHEST 1 VIEW COMPARISON:  None FINDINGS: There is a complex right pleural effusion tracking within the fissures and along the lung apex, worrisome for loculation. Adjacent opacity likely reflect some passive atelectasis though an underlying consolidative processes not excluded. No pneumothorax. Left lung is essentially clear. The aorta is calcified. Right heart border is obscured by adjacent opacity. Remaining cardiomediastinal contours are unremarkable. Degenerative changes are present in the imaged spine and shoulders. No acute osseous or soft tissue abnormality. IMPRESSION: 1. Findings worrisome for complex right pleural effusion. Underlying consolidative processes not excluded. Could consider CT chest with contrast for further characterization. Electronically Signed   By: Lovena Le M.D.   On: 09/23/2019 05:36    ASSESSMENT: 79 y.o. [place] woman with a 20-pack-year smoking history, presenting 09/23/2019 with acute pulmonary emboli,  a large hilar mass causing SVC syndrome, and multiple bilateral pulmonary and bone metastases  (1) bronchoscopy/endobronchial ultrasound and biopsy scheduled for 09/24/2018  (2)  palliative radiation to start 09/23/2018  (3) systemic disease to be discussed with the patient once the final pathology details are available  PLAN: I discussed the overall situation with Katharine Look.  She understands clinically she has stage IV lung cancer, but that we need tissue to confirm this.  I have written for sputum cytology but she is also scheduled for bronchoscopy and biopsy tomorrow, which likely will be more definitive.  If she does have stage IV lung cancer as we expect she understands this is not a curable disease.  There are many treatments available and we briefly discussed radiation chemotherapy and targeted therapy.  She understands until we have pathology details we will not be able to advise her regarding those.  We also discussed advanced directives.  She does not have advanced directives at this point but she would want "everything done" if her symptoms worsen.  She intends to name her son Emily Kim as her healthcare power of attorney.  We can request the chaplain service to facilitate her completing those documents over the next several days  I will discuss switching her case over to our thoracic oncology specialist Dr.Mohammed once the final pathology is available  I spent 65 minutes in total  time in this case  Chauncey Cruel, MD   09/24/2019 11:18 AM Medical Oncology and Hematology [facility] [address] [place], [place] [address] Tel. [phone]    Fax. [phone]

## 2019-09-25 ENCOUNTER — Other Ambulatory Visit: Payer: Self-pay | Admitting: *Deleted

## 2019-09-25 ENCOUNTER — Inpatient Hospital Stay (HOSPITAL_COMMUNITY): Payer: Medicare Other

## 2019-09-25 ENCOUNTER — Ambulatory Visit
Admission: RE | Admit: 2019-09-25 | Discharge: 2019-09-25 | Disposition: A | Discharge status: Home / Self Care | Payer: Medicare Other | Source: Ambulatory Visit | Attending: Radiation Oncology | Admitting: Radiation Oncology

## 2019-09-25 ENCOUNTER — Inpatient Hospital Stay (HOSPITAL_COMMUNITY): Payer: Medicare Other | Admitting: Anesthesiology

## 2019-09-25 ENCOUNTER — Encounter (HOSPITAL_COMMUNITY)
Admission: EM | Disposition: A | Discharge status: Skilled Nursing Facility | Payer: Self-pay | Source: Home / Self Care | Attending: Internal Medicine

## 2019-09-25 DIAGNOSIS — R222 Localized swelling, mass and lump, trunk: Secondary | ICD-10-CM

## 2019-09-25 DIAGNOSIS — I2602 Saddle embolus of pulmonary artery with acute cor pulmonale: Secondary | ICD-10-CM

## 2019-09-25 HISTORY — PX: VIDEO BRONCHOSCOPY WITH ENDOBRONCHIAL ULTRASOUND: SHX6177

## 2019-09-25 LAB — CBC
HCT: 33.4 % — ABNORMAL LOW (ref 36.0–46.0)
Hemoglobin: 10.9 g/dL — ABNORMAL LOW (ref 12.0–15.0)
MCH: 25.7 pg — ABNORMAL LOW (ref 26.0–34.0)
MCHC: 32.6 g/dL (ref 30.0–36.0)
MCV: 78.8 fL — ABNORMAL LOW (ref 80.0–100.0)
Platelets: 212 10*3/uL (ref 150–400)
RBC: 4.24 MIL/uL (ref 3.87–5.11)
RDW: 15 % (ref 11.5–15.5)
WBC: 11.7 10*3/uL — ABNORMAL HIGH (ref 4.0–10.5)
nRBC: 0 % (ref 0.0–0.2)

## 2019-09-25 LAB — BASIC METABOLIC PANEL
Anion gap: 13 (ref 5–15)
BUN: 20 mg/dL (ref 8–23)
CO2: 20 mmol/L — ABNORMAL LOW (ref 22–32)
Calcium: 9.2 mg/dL (ref 8.9–10.3)
Chloride: 93 mmol/L — ABNORMAL LOW (ref 98–111)
Creatinine, Ser: 0.68 mg/dL (ref 0.44–1.00)
GFR calc Af Amer: >60 mL/min (ref >60)
GFR calc non Af Amer: >60 mL/min (ref >60)
Glucose, Bld: 116 mg/dL — ABNORMAL HIGH (ref 70–99)
Potassium: 4.5 mmol/L (ref 3.5–5.1)
Sodium: 126 mmol/L — ABNORMAL LOW (ref 135–145)

## 2019-09-25 LAB — ECHOCARDIOGRAM COMPLETE
Height: 67 in
Weight: 2966.51 oz

## 2019-09-25 SURGERY — BRONCHOSCOPY, WITH EBUS
Anesthesia: General

## 2019-09-25 MED ORDER — EPINEPHRINE PF 1 MG/ML IJ SOLN
INTRAMUSCULAR | Status: DC | PRN
  Administered 2019-09-25: 1 mg

## 2019-09-25 MED ORDER — FENTANYL CITRATE (PF) 250 MCG/5ML IJ SOLN
INTRAMUSCULAR | Status: AC
  Filled 2019-09-25: qty 5

## 2019-09-25 MED ORDER — ONDANSETRON HCL 4 MG/2ML IJ SOLN
INTRAMUSCULAR | Status: DC | PRN
  Administered 2019-09-25: 4 mg via INTRAVENOUS

## 2019-09-25 MED ORDER — OXYCODONE HCL 5 MG/5ML PO SOLN: 5.0000 mg | Freq: Once | ORAL | Status: DC | PRN

## 2019-09-25 MED ORDER — LIDOCAINE 2% (20 MG/ML) 5 ML SYRINGE
INTRAMUSCULAR | Status: DC | PRN
  Administered 2019-09-25: 6 mg via INTRAVENOUS

## 2019-09-25 MED ORDER — EPINEPHRINE PF 1 MG/ML IJ SOLN
INTRAMUSCULAR | Status: AC
  Filled 2019-09-25: qty 1

## 2019-09-25 MED ORDER — DEXAMETHASONE SODIUM PHOSPHATE 10 MG/ML IJ SOLN
INTRAMUSCULAR | Status: DC | PRN
  Administered 2019-09-25: 10 mg via INTRAVENOUS

## 2019-09-25 MED ORDER — LIDOCAINE 2% (20 MG/ML) 5 ML SYRINGE
INTRAMUSCULAR | Status: AC
  Filled 2019-09-25: qty 5

## 2019-09-25 MED ORDER — SODIUM CHLORIDE 0.9% FLUSH: 3.0000 mL | INTRAVENOUS | Status: DC | PRN

## 2019-09-25 MED ORDER — HEPARIN (PORCINE) 25000 UT/250ML-% IV SOLN
1100.0000 [IU]/h | INTRAVENOUS | Status: DC
  Administered 2019-09-26 – 2019-09-30 (×5): 1100 [IU]/h via INTRAVENOUS
  Filled 2019-09-25 (×5): qty 250

## 2019-09-25 MED ORDER — SODIUM CHLORIDE 0.9% FLUSH
3.0000 mL | Freq: Two times a day (BID) | INTRAVENOUS | Status: DC
  Administered 2019-09-25 – 2019-10-07 (×12): 3 mL via INTRAVENOUS

## 2019-09-25 MED ORDER — 0.9 % SODIUM CHLORIDE (POUR BTL) OPTIME
TOPICAL | Status: DC | PRN
  Administered 2019-09-25: 1000 mL

## 2019-09-25 MED ORDER — PHENYLEPHRINE 40 MCG/ML (10ML) SYRINGE FOR IV PUSH (FOR BLOOD PRESSURE SUPPORT)
PREFILLED_SYRINGE | INTRAVENOUS | Status: AC
  Filled 2019-09-25: qty 10

## 2019-09-25 MED ORDER — PHENYLEPHRINE 40 MCG/ML (10ML) SYRINGE FOR IV PUSH (FOR BLOOD PRESSURE SUPPORT)
PREFILLED_SYRINGE | INTRAVENOUS | Status: DC | PRN
  Administered 2019-09-25: 80 ug via INTRAVENOUS

## 2019-09-25 MED ORDER — ONDANSETRON HCL 4 MG/2ML IJ SOLN
INTRAMUSCULAR | Status: AC
  Filled 2019-09-25: qty 2

## 2019-09-25 MED ORDER — ROCURONIUM BROMIDE 10 MG/ML (PF) SYRINGE
PREFILLED_SYRINGE | INTRAVENOUS | Status: DC | PRN
  Administered 2019-09-25: 40 mg via INTRAVENOUS

## 2019-09-25 MED ORDER — PROPOFOL 10 MG/ML IV BOLUS
INTRAVENOUS | Status: DC | PRN
  Administered 2019-09-25: 100 mg via INTRAVENOUS

## 2019-09-25 MED ORDER — ONDANSETRON HCL 4 MG/2ML IJ SOLN: 4.0000 mg | Freq: Once | INTRAMUSCULAR | Status: DC | PRN

## 2019-09-25 MED ORDER — SUCCINYLCHOLINE CHLORIDE 200 MG/10ML IV SOSY
PREFILLED_SYRINGE | INTRAVENOUS | Status: DC | PRN
  Administered 2019-09-25: 100 mg via INTRAVENOUS

## 2019-09-25 MED ORDER — ROCURONIUM BROMIDE 10 MG/ML (PF) SYRINGE
PREFILLED_SYRINGE | INTRAVENOUS | Status: AC
  Filled 2019-09-25: qty 10

## 2019-09-25 MED ORDER — FENTANYL CITRATE (PF) 250 MCG/5ML IJ SOLN
INTRAMUSCULAR | Status: DC | PRN
  Administered 2019-09-25: 50 ug via INTRAVENOUS

## 2019-09-25 MED ORDER — PROPOFOL 10 MG/ML IV BOLUS
INTRAVENOUS | Status: AC
  Filled 2019-09-25: qty 40

## 2019-09-25 MED ORDER — SODIUM CHLORIDE 0.9 % IV SOLN: INTRAVENOUS | Status: DC | PRN

## 2019-09-25 MED ORDER — DEXAMETHASONE SODIUM PHOSPHATE 10 MG/ML IJ SOLN
INTRAMUSCULAR | Status: AC
  Filled 2019-09-25: qty 1

## 2019-09-25 MED ORDER — SODIUM CHLORIDE 0.9 % IV SOLN
250.0000 mL | INTRAVENOUS | Status: DC | PRN
  Administered 2019-10-01 – 2019-10-06 (×5): 250 mL via INTRAVENOUS

## 2019-09-25 MED ORDER — CEFAZOLIN SODIUM-DEXTROSE 2-3 GM-%(50ML) IV SOLR
INTRAVENOUS | Status: DC | PRN
  Administered 2019-09-25: 2 g via INTRAVENOUS

## 2019-09-25 MED ORDER — FENTANYL CITRATE (PF) 100 MCG/2ML IJ SOLN: 25.0000 ug | INTRAMUSCULAR | Status: DC | PRN

## 2019-09-25 MED ORDER — PHENYLEPHRINE HCL-NACL 10-0.9 MG/250ML-% IV SOLN
INTRAVENOUS | Status: DC | PRN
  Administered 2019-09-25: 25 ug/min via INTRAVENOUS

## 2019-09-25 MED ORDER — SUGAMMADEX SODIUM 200 MG/2ML IV SOLN
INTRAVENOUS | Status: DC | PRN
  Administered 2019-09-25: 170 mg via INTRAVENOUS

## 2019-09-25 MED ORDER — OXYCODONE HCL 5 MG PO TABS: 5.0000 mg | ORAL_TABLET | Freq: Once | ORAL | Status: DC | PRN

## 2019-09-25 SURGICAL SUPPLY — 41 items
ADAPTER VALVE BIOPSY EBUS (MISCELLANEOUS) IMPLANT
ADPTR VALVE BIOPSY EBUS (MISCELLANEOUS)
BLADE CLIPPER SURG (BLADE) ×3 IMPLANT
BRUSH CYTOL CELLEBRITY 1.5X140 (MISCELLANEOUS) ×2 IMPLANT
CANISTER SUCT 3000ML PPV (MISCELLANEOUS) ×3 IMPLANT
CONT SPEC 4OZ CLIKSEAL STRL BL (MISCELLANEOUS) ×5 IMPLANT
COVER BACK TABLE 60X90IN (DRAPES) ×3 IMPLANT
FILTER STRAW FLUID ASPIR (MISCELLANEOUS) ×2 IMPLANT
FORCEPS BIOP RJ4 1.8 (CUTTING FORCEPS) IMPLANT
FORCEPS RADIAL JAW LRG 4 PULM (INSTRUMENTS) IMPLANT
GAUZE SPONGE 4X4 12PLY STRL (GAUZE/BANDAGES/DRESSINGS) IMPLANT
GLOVE SURG SIGNA 7.5 PF LTX (GLOVE) ×3 IMPLANT
GOWN STRL REUS W/ TWL XL LVL3 (GOWN DISPOSABLE) ×1 IMPLANT
GOWN STRL REUS W/TWL XL LVL3 (GOWN DISPOSABLE) ×3
KIT CLEAN ENDO COMPLIANCE (KITS) ×6 IMPLANT
KIT TURNOVER KIT B (KITS) ×3 IMPLANT
MARKER SKIN DUAL TIP RULER LAB (MISCELLANEOUS) ×3 IMPLANT
NDL ASPIRATION VIZISHOT 19G (NEEDLE) IMPLANT
NDL ASPIRATION VIZISHOT 21G (NEEDLE) ×1 IMPLANT
NDL BLUNT 18X1 FOR OR ONLY (NEEDLE) IMPLANT
NEEDLE ASPIRATION VIZISHOT 19G (NEEDLE) IMPLANT
NEEDLE ASPIRATION VIZISHOT 21G (NEEDLE) ×3 IMPLANT
NEEDLE BLUNT 18X1 FOR OR ONLY (NEEDLE) IMPLANT
NS IRRIG 1000ML POUR BTL (IV SOLUTION) ×3 IMPLANT
OIL SILICONE PENTAX (PARTS (SERVICE/REPAIRS)) ×3 IMPLANT
PAD ARMBOARD 7.5X6 YLW CONV (MISCELLANEOUS) ×6 IMPLANT
RADIAL JAW LRG 4 PULMONARY (INSTRUMENTS)
SYR 20ML ECCENTRIC (SYRINGE) ×5 IMPLANT
SYR 20ML LL LF (SYRINGE) ×3 IMPLANT
SYR 3ML LL SCALE MARK (SYRINGE) ×2 IMPLANT
SYR 5ML LL (SYRINGE) ×3 IMPLANT
SYR 5ML LUER SLIP (SYRINGE) ×3 IMPLANT
TOWEL GREEN STERILE (TOWEL DISPOSABLE) ×3 IMPLANT
TOWEL GREEN STERILE FF (TOWEL DISPOSABLE) ×3 IMPLANT
TRAP SPECIMEN MUCOUS 40CC (MISCELLANEOUS) ×3 IMPLANT
TUBE CONNECTING 20'X1/4 (TUBING) ×1
TUBE CONNECTING 20X1/4 (TUBING) ×2 IMPLANT
VALVE BIOPSY  SINGLE USE (MISCELLANEOUS) ×6
VALVE BIOPSY SINGLE USE (MISCELLANEOUS) ×1 IMPLANT
VALVE SUCTION BRONCHIO DISP (MISCELLANEOUS) ×3 IMPLANT
WATER STERILE IRR 1000ML POUR (IV SOLUTION) ×3 IMPLANT

## 2019-09-25 NOTE — Anesthesia Procedure Notes (Addendum)
Procedure Name: Intubation Date/Time: 09/25/2019 8:13 AM Performed by: Renato Shin, CRNA Pre-anesthesia Checklist: Patient identified, Emergency Drugs available, Suction available and Patient being monitored Patient Re-evaluated:Patient Re-evaluated prior to induction Oxygen Delivery Method: Circle system utilized Preoxygenation: Pre-oxygenation with 100% oxygen Induction Type: IV induction Ventilation: Mask ventilation without difficulty Laryngoscope Size: Miller and 2 Grade View: Grade II Tube type: Oral Tube size: 8.0 mm Number of attempts: 2 Airway Equipment and Method: Stylet and Oral airway Placement Confirmation: ETT inserted through vocal cords under direct vision,  positive ETCO2 and breath sounds checked- equal and bilateral Secured at: 21 cm Tube secured with: Tape Dental Injury: Teeth and Oropharynx as per pre-operative assessment  Comments: Airway noted to be extremely swollen. Pt noted to have a significant SVC syndrome. Pt easily masked.

## 2019-09-25 NOTE — Op Note (Signed)
NAMEBAYLEIGH, LOFLIN MEDICAL RECORD FY:[id number] ACCOUNT [id number] DATE OF BIRTH:May 15, 1941 FACILITY: [facility] LOCATION: [facility] PHYSICIAN:Evette Diclemente Chaya Jan, MD  OPERATIVE REPORT  DATE OF PROCEDURE:  09/25/2019  PREOPERATIVE DIAGNOSIS:  Right upper lobe mass, likely stage IV lung cancer.  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  PROCEDURES:   1.  Bronchoscopy with brushings and endobronchial biopsies. 2.  Endobronchial ultrasound with mediastinal lymph node aspirations.  SURGEON:  Modesto Charon, MD  ASSISTANT:  None.  ANESTHESIA:  General.  FINDINGS:  Brushings showed poorly differentiated carcinoma.  CLINICAL NOTE:  Ms. Gardiner is a 79 year old woman with a remote history of tobacco abuse, who presented after a syncopal spell at home.  In the emergency room, she had a CT of the chest which showed a right hilar mass with probable mediastinal  invasion, as well as a pleural effusion, multiple pulmonary parenchymal metastases and bone metastases.  She was advised to undergo bronchoscopy and endobronchial ultrasound for diagnostic purposes in order to start treatment as soon as possible for  impending superior vena cava syndrome.  The indications, risks, benefits, and alternatives were discussed in detail with the patient.  She accepted the risks and agreed to proceed.  OPERATIVE NOTE:  Ms. Imes was brought to the operating room on 09/25/2019.  She had induction of general anesthesia and was intubated.  A timeout was performed.  Flexible fiberoptic bronchoscopy was performed via the endotracheal tube.  There was  extensive edema in the trachea.  The left bronchial tree was within normal limits.  On the right side, there was tumor present at the takeoff of the right upper lobe bronchus.  The mucosa was friable and bled easily.  The bronchoscope was removed.  The  endobronchial ultrasound probe was advanced.   Aspirations were obtained from level 7 and 4R nodes.  With each sampling, a needle was advanced into the node with real time ultrasound imaging and 15 passes were made in the node.  Samples were obtained,  both with and without suction applied.  The endobronchial ultrasound probe then was removed.  The needle aspirations showed atypical cells, but no definitive cancer.  The bronchoscope was replaced.  Brushings were obtained from the origin of the right  upper lobe bronchus.  While those were being examined, multiple biopsies were taken around the origin of the right upper lobe as well as from within the right upper lobe bronchus itself.  A dilute epinephrine solution was applied to assist with topical  hemostasis.  The brushings showed poorly differentiated carcinoma, probable nonsmall cell carcinoma.  Multiple additional biopsies were obtained and these were all sent for permanent pathology with a request for molecular testing as well.  A final  inspection was made with the bronchoscope.  There was no ongoing bleeding.  She then was extubated in the operating room and taken to the postanesthetic care unit in good condition.  VN/NUANCE  D:09/25/2019 T:09/25/2019 JOB:[id number]

## 2019-09-25 NOTE — Transfer of Care (Signed)
Immediate Anesthesia Transfer of Care Note  Patient: Emily Kim  Procedure(s) Performed: VIDEO BRONCHOSCOPY WITH ENDOBRONCHIAL ULTRASOUND (N/A )  Patient Location: PACU  Anesthesia Type:General  Level of Consciousness: awake, oriented and patient cooperative  Airway & Oxygen Therapy: Patient Spontanous Breathing and Patient connected to face mask oxygen  Post-op Assessment: Report given to RN and Post -op Vital signs reviewed and stable  Post vital signs: Reviewed and stable  Last Vitals:  Vitals Value Taken Time  BP 151/73 09/25/19 0927  Temp 36.6 C 09/25/19 0927  Pulse 95 09/25/19 0936  Resp 20 09/25/19 0936  SpO2 99 % 09/25/19 0936  Vitals shown include unvalidated device data.  Last Pain:  Vitals:   09/25/19 0542  TempSrc: Oral  PainSc:          Complications: No apparent anesthesia complications

## 2019-09-25 NOTE — Progress Notes (Signed)
Stopped the IV Heparin drip according to Dr. Leonarda Salon recommendation to prepare for the pt's bronchoscopy in the AM.  Informed pharmacy. Will continue to monitor.  Lupita Dawn, RN

## 2019-09-25 NOTE — Progress Notes (Signed)
I spoke to the patient to let her know the biopsy showed preliminary findings of poorly differentiated carcinoma favoring non-small cell phenotype.  I also had permission to speak with her son's girlfriend Emily Kim.  I was able to speak with Ms. Emily Kim and the patient's son Emily Kim to update them as well.  We will plan to order an MRI of the brain when she is able to lay flat hopefully over the weekend, and they understand that she would need outpatient PET scan staging.  Her treatment will begin this afternoon and hopefully she can also transfer to [facility] where she can receive her treatments.    Carola Rhine, PAC

## 2019-09-25 NOTE — Anesthesia Postprocedure Evaluation (Signed)
Anesthesia Post Note  Patient: Emily Kim  Procedure(s) Performed: VIDEO BRONCHOSCOPY WITH ENDOBRONCHIAL ULTRASOUND (N/A )     Patient location during evaluation: PACU Anesthesia Type: General Level of consciousness: awake and alert Pain management: pain level controlled Vital Signs Assessment: post-procedure vital signs reviewed and stable Respiratory status: spontaneous breathing, nonlabored ventilation, respiratory function stable and patient connected to nasal cannula oxygen Cardiovascular status: blood pressure returned to baseline and stable Postop Assessment: no apparent nausea or vomiting Anesthetic complications: no    Last Vitals:  Vitals:   09/25/19 1048 09/25/19 1106  BP:  (!) 142/82  Pulse: 100 98  Resp: (!) 26 20  Temp: 37 C 36.4 C  SpO2: 95% 95%    Last Pain:  Vitals:   09/25/19 1106  TempSrc: Oral  PainSc:                  Audry Pili

## 2019-09-25 NOTE — Progress Notes (Signed)
PROGRESS NOTE    Emily Kim   EYC:[id number]  DOB: 13-Apr-1941  DOA: 09/23/2019 PCP: Patient, No Pcp Per   Brief Narrative:  Patient is a  79 year old African-American female with past medical history significant for uterine cancer s/p hysterectomy [facility], [place]), apparently notes 50lbs of weight loss over the past 3 month and reformed tobacco user.  Apparently, patient smoked 1 pack of cigarettes daily for 15 years, but quit about 30 years ago.  Patient presented with shortness of breath and hemoptysis intermittently over the past 2 weeks.  CTA chest done on 09/22/2018 revealed acute segmental pulmonary emboli in the left lobe with likely metastatic lung cancer (CTA chest finding is documented below).  Patient is currently on heparin drip.  Patient underwent video bronchoscopy with endotracheal ultrasound for needle aspirations, brushings and endobronchial biopsies.  Preliminary biopsy is said to be suggestive of non-small cell lung cancer, metastatic.  Patient was seen by the radiation oncology team and they want to start radiotherapy today for severe superior vena cava syndrome.  Patient will be transferred to [facility] when a bed is available.  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  No new complaints.  Patient is currently an echocardiography.  Above history was confirmed.  Patient will be transferred to [facility] when a bed is available.  Subjective: No new complaints.     Assessment & Plan: Pulmonary emboli: -Patient has been on  heparin.  -Heparin is currently on hold as radiotherapy is planned.  -Resume heparin as per radiation oncology protocol.   -Pulmonary emboli is likely related to metastatic lung cancer.  Metastatic lung cancer with lymphangitic spread, bone metastasis-loculated right pleural effusion likely represents a malignant effusion Weight loss 50 pound weight loss -Need oncology consult-sputum cytology x3 recommended-they will consider palliative radiation to primary -Surgery consulted-recommending bronchoscopy and endobronchial ultrasound on 1/7 after holding heparin for 6 hours 09/25/2019: Kindly see above.  Patient underwent diagnostic procedure by the cardiothoracic team.  Preliminary diagnosis is suggestive of metastatic none small cell lung cancer.  SVC syndrome as previously documented.  For radiation therapy today.  Severe hyponatremia -Serum osmolality is 259 -Urine sodium and urine osmolality ordered yesterday but have not yet been sent- patient  urinated about 1 hr ago into a bedpan 09/25/2019: Sodium today is 126.  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  Severe SVC stenosis with collaterals - swelling of face noted 09/25/2019: 4 radiation therapy today.  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   Time spent in minutes: 35 DVT prophylaxis: Heparin Code Status: Full code Family Communication:  Disposition Plan: Bronch tomorrow Consultants:   Hematology  CT surgery Procedures:   none Antimicrobials:  Anti-infectives (From admission, onward)   None       Objective: Vitals:   09/25/19 1045 09/25/19 1047 09/25/19 1048 09/25/19 1106  BP:    (!) 142/82  Pulse: (!) 101 100 100 98  Resp: (!) 22 (!) 22 (!) 26 20  Temp:   98.6 F (37 C) 97.6 F (36.4 C)  TempSrc:    Oral  SpO2: 95% 95% 95%  95%  Weight:      Height:        Intake/Output Summary (Last 24 hours) at 09/25/2019 1409 Last data filed at 09/25/2019 1100 Gross per 24 hour  Intake  1570.19 ml  Output 250 ml  Net 1320.19 ml   Filed Weights   09/23/19 2100 09/25/19 0542  Weight: 74.8 kg 84.1 kg    Examination: General exam: Appears comfortable  HEENT: Mild pallor.  No jaundice.   Respiratory system: Decreased air entry with inspiratory and expiratory transmitted sounds. Cardiovascular system: S1 & S2  Gastrointestinal system: Abdomen soft, non-tender, nondistended. Normal bowel sounds. Central nervous system: Awake and alert.  Patient moves all extremities.   Extremities: No leg edema  Data Reviewed: I have personally reviewed following labs and imaging studies  CBC: Recent Labs  Lab 09/23/19 0507 09/24/19 0500 09/25/19 0317  WBC 7.1 8.0 11.7*  HGB 11.0* 10.9* 10.9*  HCT 33.0* 33.7* 33.4*  MCV 80.3 79.9* 78.8*  PLT 222 199 169   Basic Metabolic Panel: Recent Labs  Lab 09/23/19 0507 09/24/19 0500 09/24/19 1924 09/25/19 0317  NA 121* 122* 123* 126*  K 4.4 4.7 4.5 4.5  CL 87* 88* 91* 93*  CO2 21* 17* 17* 20*  GLUCOSE 103* 95 98 116*  BUN 15 18 18 20   CREATININE 0.86 0.83 0.76 0.68  CALCIUM 9.1 9.2 9.1 9.2   GFR: Estimated Creatinine Clearance: 64.6 mL/min (by C-G formula based on SCr of 0.68 mg/dL). Liver Function Tests: Recent Labs  Lab 09/24/19 0500  AST 25  ALT 14  ALKPHOS 172*  BILITOT 0.6  PROT 7.1  ALBUMIN 3.1*   No results for input(s): LIPASE, AMYLASE in the last 168 hours. No results for input(s): AMMONIA in the last 168 hours. Coagulation Profile: Recent Labs  Lab 09/24/19 1651  INR 1.3*   Cardiac Enzymes: No results for input(s): CKTOTAL, CKMB, CKMBINDEX, TROPONINI in the last 168 hours. BNP (last 3 results) No results for input(s): PROBNP in the last 8760 hours. HbA1C: No results for input(s): HGBA1C in the last 72 hours. CBG: No results for input(s): GLUCAP in the last 168 hours. Lipid Profile: No results for input(s): CHOL, HDL, LDLCALC, TRIG, CHOLHDL, LDLDIRECT in the last 72 hours. Thyroid Function  Tests: Recent Labs    09/24/19 0500  TSH 3.807   Anemia Panel: No results for input(s): VITAMINB12, FOLATE, FERRITIN, TIBC, IRON, RETICCTPCT in the last 72 hours. Urine analysis: No results found for: COLORURINE, APPEARANCEUR, LABSPEC, PHURINE, GLUCOSEU, HGBUR, BILIRUBINUR, KETONESUR, PROTEINUR, UROBILINOGEN, NITRITE, LEUKOCYTESUR Sepsis Labs: @LABRCNTIP (procalcitonin:4,lacticidven:4) ) Recent Results (from the past 240 hour(s))  SARS CORONAVIRUS 2 (TAT 6-24 HRS) Nasopharyngeal Nasopharyngeal Swab     Status: None   Collection Time: 09/23/19  2:51 PM   Specimen: Nasopharyngeal Swab  Result Value Ref Range Status   SARS Coronavirus 2 NEGATIVE NEGATIVE Final    Comment: (NOTE) SARS-CoV-2 target nucleic acids are NOT DETECTED. The SARS-CoV-2 RNA is generally detectable in upper and lower respiratory specimens during the acute phase of infection. Negative results do not preclude SARS-CoV-2 infection, do not rule out co-infections with other pathogens, and should not be used as the sole basis for treatment or other patient management decisions. Negative results must be combined with clinical observations, patient history, and epidemiological information. The expected result is Negative. Fact Sheet for Patients: SugarRoll.be Fact Sheet for Healthcare Providers: https://www.woods-mathews.com/ This test is not yet approved or cleared by the [place] FDA and  has been authorized for detection  and/or diagnosis of SARS-CoV-2 by FDA under an Emergency Use Authorization (EUA). This EUA will remain  in effect (meaning this test can be used) for the duration of the COVID-19 declaration under Section 56 4(b)(1) of the Act, 21 U.S.C. section 360bbb-3(b)(1), unless the authorization is terminated or revoked sooner. Performed at [facility], [place] [address], [place], [place] [address]          Radiology Studies: DG Chest 2  View  Result Date: 09/24/2019 CLINICAL DATA:  Preop evaluation EXAM: CHEST - 2 VIEW COMPARISON:  09/23/2019 FINDINGS: Cardiac shadows within normal limits. Right-sided pleural effusion is noted. Scattered pulmonary nodules are identified consistent with that seen on recent CT examination. Right paratracheal mass is noted consistent with known mass lesion. No acute bony abnormality is seen. IMPRESSION: Stable right-sided pleural effusion as well as pulmonary nodules. Persistent right paratracheal mass is noted as well. Electronically Signed   By: Inez Catalina M.D.   On: 09/24/2019 23:30   CT Head Wo Contrast  Result Date: 09/23/2019 CLINICAL DATA:  Status post trauma. EXAM: CT HEAD WITHOUT CONTRAST TECHNIQUE: Contiguous axial images were obtained from the base of the skull through the vertex without intravenous contrast. COMPARISON:  None. FINDINGS: Brain: There is mild cerebral atrophy with widening of the extra-axial spaces and ventricular dilatation. There are areas of decreased attenuation within the white matter tracts of the supratentorial brain, consistent with microvascular disease changes. Vascular: No hyperdense vessel or unexpected calcification. Skull: Normal. Negative for fracture or focal lesion. Sinuses/Orbits: There is moderate severity right maxillary sinus mucosal thickening. Other: None. IMPRESSION: 1. No acute intracranial abnormality. 2. Mild cerebral atrophy and microvascular disease changes of the supratentorial brain. 3. Moderate severity right maxillary sinus mucosal thickening. The presence of a large polyp versus mucous retention cyst cannot be excluded. Electronically Signed   By: Virgina Norfolk M.D.   On: 09/23/2019 20:38   CT Angio Chest PE W/Cm &/Or Wo Cm  Result Date: 09/23/2019 CLINICAL DATA:  Shortness of breath and cough for the past week. Blood tinged sputum. EXAM: CT ANGIOGRAPHY CHEST WITH CONTRAST TECHNIQUE: Multidetector CT imaging of the chest was performed using the  standard protocol during bolus administration of intravenous contrast. Multiplanar CT image reconstructions and MIPs were obtained to evaluate the vascular anatomy. CONTRAST:  13mL OMNIPAQUE IOHEXOL 350 MG/ML SOLN COMPARISON:  Chest x-ray from same day. FINDINGS: Cardiovascular: Satisfactory opacification of the pulmonary arteries to the segmental level. Acute segmental pulmonary emboli in the left lower lobe. Severe stenosis of the distal right pulmonary artery. Normal heart size. No pericardial effusion. Severe stenosis of the SVC with multiple mediastinal and right chest wall collateral veins, and prominent enhancement of the azygos system. No thoracic aortic aneurysm. Mediastinum/Nodes: No enlarged mediastinal, hilar, or axillary lymph nodes. Thyroid gland, trachea, and esophagus demonstrate no significant findings. Lungs/Pleura: Large ill-defined right hilar and suprahilar mass with narrowing of the right upper, middle, and lower lobe bronchi. Complete collapse of the right middle lobe. Perilymphatic nodularity and innumerable small pulmonary nodules in both lungs. Moderate loculated right pleural effusion. Small left pleural effusion. Upper Abdomen: Enhancement of the anterior left hepatic lobe. Punctate nonobstructive left renal calculus. Musculoskeletal: Sclerosis of the lateral right ninth rib and left aspect of the sternal body. Sclerotic lesions involving the T3, T6, T7, T9, T10, and T12 vertebral bodies. Review of the MIP images confirms the above findings. IMPRESSION: 1. Acute segmental pulmonary emboli in the left lower lobe. 2. Widely metastatic lung cancer.  Large ill-defined right hilar and suprahilar mass with bilateral pulmonary parenchymal metastases and lymphangitic carcinomatosis. Multiple osseous metastases. 3. The dominant mass results in severe stenosis of the SVC with multiple mediastinal and right chest wall collateral veins, prominent filling of the azygos system and IVC, as well as  enhancement of the anterior left hepatic lobe. Correlate for signs and symptoms of SVC syndrome. 4. Similar resultant severe stenosis of the distal right pulmonary artery and narrowing of the right upper, middle, and lower lobe bronchi with complete collapse of the right middle lobe. 5. Moderate loculated right pleural effusion. Small left pleural effusion. Critical Value/emergent results were called by telephone at the time of interpretation on 09/23/2019 at 8:59 pm to provider Claiborne Billings PA, who verbally acknowledged these results. Electronically Signed   By: Titus Dubin M.D.   On: 09/23/2019 21:08   DG Chest Port 1 View  Result Date: 09/25/2019 CLINICAL DATA:  Status post bronchoscopy and biopsies today. EXAM: PORTABLE CHEST 1 VIEW COMPARISON:  PA and lateral chest earlier today. CT chest 09/23/2019. FINDINGS: There is no pneumothorax after bronchoscopy. Right much greater than left pleural effusions again seen. Right suprahilar mass and multiple bilateral pulmonary nodules are again seen. Heart size is normal. Atherosclerosis noted. IMPRESSION: Negative for pneumothorax after bronchoscopy. No change in right suprahilar mass, right greater than left pleural effusions and multiple pulmonary nodules. Electronically Signed   By: Inge Rise M.D.   On: 09/25/2019 10:09      Scheduled Meds: . sodium chloride flush  3 mL Intravenous Q12H   Continuous Infusions: . sodium chloride 75 mL/hr at 09/25/19 1110  . sodium chloride    . [START ON 09/26/2019] heparin       LOS: 2 days      Bonnell Public, MD [facility] Pager: [email or web address] Password [email or web address] 09/25/2019, 2:09 PM

## 2019-09-25 NOTE — Progress Notes (Signed)
[place] for heparin Indication: pulmonary embolus   Patient Measurements: Heparin Dosing Weight: 74 kg  Vital Signs: Temp: 97.6 F (36.4 C) (01/07 1106) Temp Source: Oral (01/07 1106) BP: 142/82 (01/07 1106) Pulse Rate: 98 (01/07 1106)  Labs: Recent Labs    09/23/19 0507 09/23/19 1430 09/24/19 0500 09/24/19 0605 09/24/19 1308 09/24/19 1651 09/24/19 1924 09/25/19 0317  HGB 11.0*  --  10.9*  --   --   --   --  10.9*  HCT 33.0*  --  33.7*  --   --   --   --  33.4*  PLT 222  --  199  --   --   --   --  212  LABPROT  --   --   --   --   --  15.9*  --   --   INR  --   --   --   --   --  1.3*  --   --   HEPARINUNFRC  --   --   --  0.64 0.86*  --   --   --   CREATININE 0.86  --  0.83  --   --   --  0.76 0.68  TROPONINIHS 25* 23* 35*  --   --   --   --   --    Assessment: 79 y.o. female continues on IV heparin for PE.  Heparin to resume 09/26/19 at noon (24 hours post procedure)  Goal of Therapy:  Heparin level 0.3-0.7 units/ml Monitor platelets by anticoagulation protocol: Yes   Plan:  Heparin at 1100 units / hr starting 12 noon 09/26/19 Will order levels tomorrow   Tad Moore 09/25/2019,12:11 PM

## 2019-09-25 NOTE — Brief Op Note (Signed)
09/25/2019  9:33 AM  PATIENT:  Emily Kim  79 y.o. female  PRE-OPERATIVE DIAGNOSIS:  LUNG MASS  POST-OPERATIVE DIAGNOSIS:  LUNG MASS  PROCEDURE:  Procedure(s): VIDEO BRONCHOSCOPY WITH ENDOBRONCHIAL ULTRASOUND (N/A) Needle aspirations, brushings, endobronchial biopsies  SURGEON:  Surgeon(s) and Role:    * Melrose Nakayama, MD - Primary  PHYSICIAN ASSISTANT:   ASSISTANTS: none   ANESTHESIA:   general  EBL: minimal  BLOOD ADMINISTERED:none  DRAINS: none   LOCAL MEDICATIONS USED:  NONE  SPECIMEN:  Source of Specimen:  Level 7 and 4R nodes, RUL mass  DISPOSITION OF SPECIMEN:  PATHOLOGY  COUNTS:  NO endo  TOURNIQUET:  * No tourniquets in log *  DICTATION: .Other Dictation: Dictation Number -  PLAN OF CARE: Admit to inpatient   PATIENT DISPOSITION:  PACU - hemodynamically stable.   Delay start of Pharmacological VTE agent (>24hrs) due to surgical blood loss or risk of bleeding: Resume anticoagulation in 24 hours

## 2019-09-25 NOTE — Progress Notes (Signed)
I spoke with the patient's other son Yazlyn Wentzel to update him as well. He plans to come down to [place] from [facility] next week to help take care of his mother.

## 2019-09-25 NOTE — Anesthesia Preprocedure Evaluation (Addendum)
Anesthesia Evaluation  Patient identified by MRN, date of birth, ID band Patient awake    Reviewed: Allergy & Precautions, NPO status , Patient's Chart, lab work & pertinent test results  History of Anesthesia Complications Negative for: history of anesthetic complications  Airway Mallampati: II  TM Distance: >3 FB Neck ROM: Full    Dental  (+) Dental Advisory Given, Missing   Pulmonary former smoker, PE  RUL lung mass SVC narrowing due to mass Loculated pleural effusion    Pulmonary exam normal        Cardiovascular Normal cardiovascular exam   SVC syndrome    Neuro/Psych negative neurological ROS  negative psych ROS   GI/Hepatic negative GI ROS, Neg liver ROS,   Endo/Other   Hyponatremia Hypochloremia   Renal/GU negative Renal ROS     Musculoskeletal  Bone metastases    Abdominal   Peds  Hematology  (+) anemia ,   Anesthesia Other Findings Covid neg 1/5   Reproductive/Obstetrics  Uterine cancer                            Anesthesia Physical Anesthesia Plan  ASA: III  Anesthesia Plan: General   Post-op Pain Management:    Induction: Intravenous  PONV Risk Score and Plan: 3 and Treatment may vary due to age or medical condition and Ondansetron  Airway Management Planned: Oral ETT  Additional Equipment: None  Intra-op Plan:   Post-operative Plan: Extubation in OR  Informed Consent: I have reviewed the patients History and Physical, chart, labs and discussed the procedure including the risks, benefits and alternatives for the proposed anesthesia with the patient or authorized representative who has indicated his/her understanding and acceptance.     Dental advisory given  Plan Discussed with: CRNA and Anesthesiologist  Anesthesia Plan Comments:        Anesthesia Quick Evaluation

## 2019-09-25 NOTE — Progress Notes (Signed)
The proposed treatment discussed in cancer conference 09/25/19 is for discussion purpose only and is not a binding recommendation.  The patient was not physically examined nor present for their treatment options.  Therefore, final treatment plans cannot be decided.

## 2019-09-25 NOTE — Progress Notes (Signed)
Patient scheduled for 1615 radiation treatment on L2 today. Phoned McKayla, RN on [facility] at [facility] to inquire if [facility] has been arranged. She explains she has not. She reports her intent to phone hospitalist to request transfer orders. Provided her with number for [facility]. Rad onc staff informed of findings via email.

## 2019-09-25 NOTE — Progress Notes (Signed)
Echocardiogram 2D Echocardiogram has been performed.  Oneal Deputy Flor Whitacre 09/25/2019, 2:15 PM

## 2019-09-25 NOTE — Progress Notes (Signed)
Pt picked up by [facility] for radiation treatment at [facility]. Report called to [place]. Cancer center notified of pt bed placement. All belongings transferred with pt.  Amanda Cockayne, RN

## 2019-09-26 ENCOUNTER — Ambulatory Visit
Admit: 2019-09-26 | Discharge: 2019-09-26 | Disposition: A | Discharge status: Home / Self Care | Payer: Medicare Other | Attending: Radiation Oncology | Admitting: Radiation Oncology

## 2019-09-26 ENCOUNTER — Inpatient Hospital Stay (HOSPITAL_COMMUNITY): Payer: Medicare Other

## 2019-09-26 ENCOUNTER — Encounter: Payer: Self-pay | Admitting: *Deleted

## 2019-09-26 DIAGNOSIS — C3411 Malignant neoplasm of upper lobe, right bronchus or lung: Secondary | ICD-10-CM

## 2019-09-26 DIAGNOSIS — J90 Pleural effusion, not elsewhere classified: Secondary | ICD-10-CM

## 2019-09-26 DIAGNOSIS — C3491 Malignant neoplasm of unspecified part of right bronchus or lung: Secondary | ICD-10-CM

## 2019-09-26 LAB — CBC
HCT: 35.6 % — ABNORMAL LOW (ref 36.0–46.0)
Hemoglobin: 10.9 g/dL — ABNORMAL LOW (ref 12.0–15.0)
MCH: 25.7 pg — ABNORMAL LOW (ref 26.0–34.0)
MCHC: 30.6 g/dL (ref 30.0–36.0)
MCV: 84 fL (ref 80.0–100.0)
Platelets: 203 10*3/uL (ref 150–400)
RBC: 4.24 MIL/uL (ref 3.87–5.11)
RDW: 15.6 % — ABNORMAL HIGH (ref 11.5–15.5)
WBC: 13.2 10*3/uL — ABNORMAL HIGH (ref 4.0–10.5)
nRBC: 0 % (ref 0.0–0.2)

## 2019-09-26 LAB — HEPARIN LEVEL (UNFRACTIONATED): Heparin Unfractionated: 0.14 IU/mL — ABNORMAL LOW (ref 0.30–0.70)

## 2019-09-26 LAB — CYTOLOGY - NON PAP

## 2019-09-26 LAB — SURGICAL PATHOLOGY

## 2019-09-26 MED ORDER — IOHEXOL 9 MG/ML PO SOLN: 500.0000 mL | ORAL | Status: AC

## 2019-09-26 MED ORDER — GADOBUTROL 1 MMOL/ML IV SOLN
8.0000 mL | Freq: Once | INTRAVENOUS | Status: AC | PRN
  Administered 2019-09-26: 8 mL via INTRAVENOUS

## 2019-09-26 MED ORDER — LIDOCAINE HCL 1 % IJ SOLN
INTRAMUSCULAR | Status: AC
  Filled 2019-09-26: qty 20

## 2019-09-26 MED ORDER — SODIUM CHLORIDE (PF) 0.9 % IJ SOLN
INTRAMUSCULAR | Status: AC
  Filled 2019-09-26: qty 50

## 2019-09-26 MED ORDER — IOHEXOL 300 MG/ML  SOLN
100.0000 mL | Freq: Once | INTRAMUSCULAR | Status: AC | PRN
  Administered 2019-09-26: 100 mL via INTRAVENOUS

## 2019-09-26 MED ORDER — IOHEXOL 9 MG/ML PO SOLN
ORAL | Status: AC
  Administered 2019-09-26: 500 mL
  Filled 2019-09-26: qty 1000

## 2019-09-26 NOTE — Progress Notes (Signed)
PROGRESS NOTE    Emily Kim  GGE:[id number] DOB: [birth year] DOA: 09/23/2019 PCP: Patient, No Pcp Per   Brief Narrative:  Patientis a 79 year old African-American female with past medical history significant for uterine cancer s/p hysterectomy [place], [place]), apparently notes 50lbs of weight loss over the past 3 month and reformed tobacco user.  Apparently, patient smoked 1 pack of cigarettes daily for 15 years, but quit about 30 years ago.  Patient presented with shortness of breath and hemoptysis intermittently over the past 2 weeks.  CTA chest done on 09/22/2018 revealed acute segmental pulmonary emboli in the left lobe with likely metastatic lung cancer (CTA chest finding is documented below).  Patient is currently on heparin drip.  Patient underwent video bronchoscopy with endotracheal ultrasound for needle aspirations, brushings and endobronchial biopsies. Preliminary biopsy is said to be suggestive of non-small cell lung cancer, metastatic.  Patient was seen by the radiation oncology team and they started palliative radiotherapy yesterday for severe superior vena cava syndrome.  Patient being transferred from [facility].  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  -Heparin is currently on hold as radiotherapy is planned.  -Resume heparin as per radiation oncology protocol.   -Pulmonary  emboli is likely related to metastatic lung cancer.  Metastatic lung cancer with lymphangitic spread, bone metastasis-loculated right pleural effusion likely represents a malignant effusion / Weight loss 50 pound weight loss -Surgery consulted-recommending bronchoscopy and endobronchial ultrasound on 1/7 after holding heparin for 6 hours 09/25/2019: Kindly see above.  Patient underwent diagnostic procedure by the cardiothoracic team.  Preliminary diagnosis is suggestive of metastatic none small cell lung cancer.  SVC syndrome as previously documented.  For radiation therapy today. Onco: Recommends staging work-up ( MRI of the brain as well as CT scan of the abdomen and pelvis to rule out any other metastatic disease). SVC syndrome:continue with short course of palliative radiotherapy by radiation oncology. Moderate loculated right pleural effusion, Needs ultrasound-guided right thoracentesis.  Severe hyponatremia: -Serum osmolality is 259 -Urine sodium and urine osmolality ordered yesterday but have not yet been sent- patient  urinated about 1 hr ago into a bedpan 09/25/2019: Sodium today is 126.  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  Severe SVC stenosis with collaterals - swelling of face noted. 09/25/2019: 4 radiation therapy daily started yesterday.  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 Family Communication:  None  At bed side. Disposition Plan: will be decided.  Consultants:   CT surgery  Hematology  Procedures:  Antimicrobials:  Anti-infectives (From admission, onward)   None     Subjective: Seen and examined at bedside, denies any overnight event. Patient has been in and out of the procedures whole day today.  Objective: Vitals:   09/25/19 1710 09/25/19 2053 09/26/19 0536 09/26/19 1254  BP: (!) 150/86 (!) 141/74 137/78 (!) 168/46  Pulse: 97 96 94 95  Resp: 20 18 18 17   Temp: 97.8  F (36.6 C) 98 F (36.7 C) 98.2 F (36.8 C) 97.8 F (36.6 C)  TempSrc: Oral Oral Oral Oral  SpO2: 99% 100% 98% 100%  Weight:      Height:        Intake/Output Summary (Last 24 hours) at 09/26/2019 1548 Last data filed at 09/26/2019 0559 Gross per 24 hour  Intake 1827.82 ml  Output --  Net 1827.82 ml   Filed Weights   09/23/19 2100 09/25/19 0542  Weight: 74.8 kg 84.1 kg    Examination:  General exam: Appears calm and comfortable  Respiratory system: Clear to auscultation. Respiratory effort normal. Cardiovascular system: S1 & S2 heard, RRR. No JVD, murmurs, rubs, gallops or clicks. No pedal edema. Gastrointestinal system: Abdomen is nondistended, soft and nontender. No organomegaly or masses felt. Normal bowel sounds heard. Central nervous system: Alert and oriented. No focal neurological deficits. Extremities: No edema, swelling. Skin: No rashes, lesions or ulcers Psychiatry: Judgement and insight appear normal. Mood & affect appropriate.    Data Reviewed: I have personally reviewed following labs and imaging studies  CBC: Recent Labs  Lab 09/23/19 0507 09/24/19 0500 09/25/19 0317 09/26/19 0434  WBC 7.1 8.0 11.7* 13.2*  HGB 11.0* 10.9* 10.9* 10.9*  HCT 33.0* 33.7* 33.4* 35.6*  MCV 80.3 79.9* 78.8* 84.0  PLT 222 199 212 408   Basic Metabolic Panel: Recent Labs  Lab 09/23/19 0507 09/24/19 0500 09/24/19 1924 09/25/19 0317  NA 121* 122* 123* 126*  K 4.4 4.7 4.5 4.5  CL 87* 88* 91* 93*  CO2 21* 17* 17* 20*  GLUCOSE 103* 95 98 116*  BUN 15 18 18 20   CREATININE 0.86 0.83 0.76 0.68  CALCIUM 9.1 9.2 9.1 9.2   GFR: Estimated Creatinine Clearance: 64.6 mL/min (by C-G formula based on SCr of 0.68 mg/dL). Liver Function Tests: Recent Labs  Lab 09/24/19 0500  AST 25  ALT 14  ALKPHOS 172*  BILITOT 0.6  PROT 7.1  ALBUMIN 3.1*   No results for input(s): LIPASE, AMYLASE in the last 168 hours. No results for input(s): AMMONIA in the last 168  hours. Coagulation Profile: Recent Labs  Lab 09/24/19 1651  INR 1.3*   Cardiac Enzymes: No results for input(s): CKTOTAL, CKMB, CKMBINDEX, TROPONINI in the last 168 hours. BNP (last 3 results) No results for input(s): PROBNP in the last 8760 hours. HbA1C: No results for input(s): HGBA1C in the last 72 hours. CBG: No results for input(s): GLUCAP in the last 168 hours. Lipid Profile: No results for input(s): CHOL, HDL, LDLCALC, TRIG, CHOLHDL, LDLDIRECT in the last 72 hours. Thyroid Function Tests: Recent Labs    09/24/19 0500  TSH 3.807   Anemia Panel: No results for input(s): VITAMINB12, FOLATE, FERRITIN, TIBC, IRON, RETICCTPCT in the last 72 hours. Sepsis Labs: No results for input(s): PROCALCITON, LATICACIDVEN in the last 168 hours.  Recent Results (from the past 240 hour(s))  SARS CORONAVIRUS 2 (TAT 6-24 HRS) Nasopharyngeal Nasopharyngeal Swab     Status: None   Collection Time: 09/23/19  2:51 PM   Specimen: Nasopharyngeal Swab  Result Value Ref Range Status   SARS Coronavirus 2 NEGATIVE NEGATIVE Final    Comment: (NOTE) SARS-CoV-2 target nucleic acids are NOT DETECTED. The SARS-CoV-2 RNA is generally detectable in upper and lower respiratory specimens during the acute phase of infection. Negative results do not preclude SARS-CoV-2 infection, do not rule out co-infections with other pathogens, and should not be used as the  sole basis for treatment or other patient management decisions. Negative results must be combined with clinical observations, patient history, and epidemiological information. The expected result is Negative. Fact Sheet for Patients: SugarRoll.be Fact Sheet for Healthcare Providers: https://www.woods-mathews.com/ This test is not yet approved or cleared by the [place] FDA and  has been authorized for detection and/or diagnosis of SARS-CoV-2 by FDA under an Emergency Use Authorization (EUA). This EUA  will remain  in effect (meaning this test can be used) for the duration of the COVID-19 declaration under Section 56 4(b)(1) of the Act, 21 U.S.C. section 360bbb-3(b)(1), unless the authorization is terminated or revoked sooner. Performed at [facility], [place] [address], [place], [place] [address]      Radiology Studies: DG Chest 2 View  Result Date: 09/24/2019 CLINICAL DATA:  Preop evaluation EXAM: CHEST - 2 VIEW COMPARISON:  09/23/2019 FINDINGS: Cardiac shadows within normal limits. Right-sided pleural effusion is noted. Scattered pulmonary nodules are identified consistent with that seen on recent CT examination. Right paratracheal mass is noted consistent with known mass lesion. No acute bony abnormality is seen. IMPRESSION: Stable right-sided pleural effusion as well as pulmonary nodules. Persistent right paratracheal mass is noted as well. Electronically Signed   By: Inez Catalina M.D.   On: 09/24/2019 23:30   MR BRAIN W WO CONTRAST  Result Date: 09/26/2019 CLINICAL DATA:  79 year old female with recently diagnosed non-small cell lung cancer. Staging. EXAM: MRI HEAD WITHOUT AND WITH CONTRAST TECHNIQUE: Multiplanar, multiecho pulse sequences of the brain and surrounding structures were obtained without and with intravenous contrast. CONTRAST:  50mL GADAVIST GADOBUTROL 1 MMOL/ML IV SOLN COMPARISON:  Chest CTA 09/23/2019. Head CT 09/23/2019. FINDINGS: Brain: No abnormal enhancement identified. Some postcontrast images are degraded by motion. No midline shift, mass effect, or evidence of intracranial mass lesion. No dural thickening. No restricted diffusion to suggest acute infarction. No ventriculomegaly, extra-axial collection or acute intracranial hemorrhage. Cervicomedullary junction and pituitary are within normal limits. Mild to moderate for age scattered and patchy bilateral cerebral white matter T2 and FLAIR hyperintensity. No cortical encephalomalacia or chronic cerebral blood products.  The deep gray matter nuclei, brainstem and cerebellum appear normal for age. Vascular: Major intracranial vascular flow voids are preserved. Major dural venous sinuses are enhancing and appear to be patent. Skull and upper cervical spine: Negative visible cervical spine and spinal cord. Bone marrow signal in the skull is mildly heterogeneous, and there is heterogeneous enhancement in the clivus (series 13, image 11), although no overtly destructive osseous lesion. Sinuses/Orbits: Negative orbits. Right maxillary sinus mucous retention cyst. Other: Visible internal auditory structures appear normal. Mastoids are clear. Scalp and face soft tissues appear negative. IMPRESSION: 1. No acute intracranial abnormality or metastatic disease to the brain. 2. Heterogeneous skull marrow signal, including in the clivus. Osseous metastatic disease not excluded. 3. Mild to moderate for age cerebral white matter signal changes, most commonly due to chronic small vessel disease. Electronically Signed   By: Genevie Ann M.D.   On: 09/26/2019 11:56   DG Chest Port 1 View  Result Date: 09/25/2019 CLINICAL DATA:  Status post bronchoscopy and biopsies today. EXAM: PORTABLE CHEST 1 VIEW COMPARISON:  PA and lateral chest earlier today. CT chest 09/23/2019. FINDINGS: There is no pneumothorax after bronchoscopy. Right much greater than left pleural effusions again seen. Right suprahilar mass and multiple bilateral pulmonary nodules are again seen. Heart size is normal. Atherosclerosis noted. IMPRESSION: Negative for pneumothorax after bronchoscopy. No change in right suprahilar mass,  right greater than left pleural effusions and multiple pulmonary nodules. Electronically Signed   By: Inge Rise M.D.   On: 09/25/2019 10:09   ECHOCARDIOGRAM COMPLETE  Result Date: 09/25/2019   ECHOCARDIOGRAM REPORT   Patient Name:   ALEXZANDRA BILTON Date of Exam: 09/25/2019 Medical Rec #:  [id number]     Height:       67.0 in Accession #:    [id number]     Weight:       185.4 lb Date of Birth:  Jun 22, 1941     BSA:          1.96 m Patient Age:    89 years      BP:           142/82 mmHg Patient Gender: F             HR:           90 bpm. Exam Location:  Inpatient Procedure: 2D Echo, Color Doppler and Cardiac Doppler Indications:    I26.02 Pulmonary embolus  History:        Patient has no prior history of Echocardiogram examinations. SVC                 Syndrome.  Sonographer:    Raquel Sarna Senior RDCS Referring Phys: [place] Comments: Technically difficult study due to poor echo windows. IMPRESSIONS  1. Left ventricular ejection fraction, by visual estimation, is 55 to 60%. The left ventricle has normal function. There is no left ventricular hypertrophy.  2. Left ventricular diastolic parameters are indeterminate.  3. The left ventricle has no regional wall motion abnormalities.  4. Global right ventricle has normal systolic function.The right ventricular size is normal. No increase in right ventricular wall thickness.  5. Left atrial size was normal.  6. Right atrial size was normal.  7. Moderate pleural effusion in both left and right lateral regions.  8. Presence of pericardial fat pad.  9. Trivial pericardial effusion is present. 10. Mild mitral annular calcification. 11. The mitral valve is normal in structure. Trivial mitral valve regurgitation. 12. The tricuspid valve is normal in structure. 13. The aortic valve is tricuspid. Aortic valve regurgitation is not visualized. Mild aortic valve sclerosis without stenosis. 14. The pulmonic valve was grossly normal. Pulmonic valve regurgitation is trivial. 15. Moderately elevated pulmonary artery systolic pressure. 16. The inferior vena cava is normal in size with greater than 50% respiratory variability, suggesting right atrial pressure of 3 mmHg. 17. Technically difficult study due to limited echo windows. No gross abnormalities seen, but reduced sensitivity due to technical limitations.  FINDINGS  Left Ventricle: Left ventricular ejection fraction, by visual estimation, is 55 to 60%. The left ventricle has normal function. The left ventricle has no regional wall motion abnormalities. There is no left ventricular hypertrophy. Left ventricular diastolic parameters are indeterminate. Right Ventricle: The right ventricular size is normal. No increase in right ventricular wall thickness. Global RV systolic function is has normal systolic function. The tricuspid regurgitant velocity is 3.19 m/s, and with an assumed right atrial pressure  of 3 mmHg, the estimated right ventricular systolic pressure is moderately elevated at 43.7 mmHg. Left Atrium: Left atrial size was normal in size. Right Atrium: Right atrial size was normal in size Pericardium: Trivial pericardial effusion is present. Presence of pericardial fat pad. There is a moderate pleural effusion in both left and right lateral regions. Mitral Valve: The mitral valve is normal in structure. There is mild thickening  of the mitral valve leaflet(s). There is mild calcification of the mitral valve leaflet(s). Mild mitral annular calcification. Trivial mitral valve regurgitation. Tricuspid Valve: The tricuspid valve is normal in structure. Tricuspid valve regurgitation is mild. Aortic Valve: The aortic valve is tricuspid. . There is mild thickening and mild calcification of the aortic valve. Aortic valve regurgitation is not visualized. Mild aortic valve sclerosis is present, with no evidence of aortic valve stenosis. There is mild thickening of the aortic valve. There is mild calcification of the aortic valve. Pulmonic Valve: The pulmonic valve was grossly normal. Pulmonic valve regurgitation is trivial. Pulmonic regurgitation is trivial. Aorta: The aortic root, ascending aorta and aortic arch are all structurally normal, with no evidence of dilitation or obstruction. Pulmonary Artery: The pulmonary artery is not well seen. Venous: The inferior vena  cava is normal in size with greater than 50% respiratory variability, suggesting right atrial pressure of 3 mmHg. IAS/Shunts: No atrial level shunt detected by color flow Doppler.  LEFT VENTRICLE PLAX 2D LVIDd:         3.90 cm  Diastology LVIDs:         2.80 cm  LV e' lateral:   5.66 cm/s LV PW:         0.90 cm  LV E/e' lateral: 9.2 LV IVS:        1.10 cm  LV e' medial:    4.35 cm/s LVOT diam:     1.90 cm  LV E/e' medial:  12.0 LV SV:         36 ml LV SV Index:   18.04 LVOT Area:     2.84 cm  RIGHT VENTRICLE RV S prime:     18.10 cm/s TAPSE (M-mode): 1.6 cm LEFT ATRIUM             Index       RIGHT ATRIUM           Index LA diam:        2.60 cm 1.33 cm/m  RA Area:     11.30 cm LA Vol (A2C):   23.8 ml 12.15 ml/m RA Volume:   24.30 ml  12.41 ml/m LA Vol (A4C):   34.9 ml 17.82 ml/m LA Biplane Vol: 29.6 ml 15.12 ml/m  AORTIC VALVE LVOT Vmax:   86.80 cm/s LVOT Vmean:  65.000 cm/s LVOT VTI:    0.167 m  AORTA Ao Root diam: 2.50 cm MITRAL VALVE                        TRICUSPID VALVE MV Area (PHT): 2.87 cm             TR Peak grad:   40.7 mmHg MV PHT:        76.56 msec           TR Vmax:        319.00 cm/s MV Decel Time: 264 msec MV E velocity: 52.30 cm/s 103 cm/s  SHUNTS MV A velocity: 59.60 cm/s 70.3 cm/s Systemic VTI:  0.17 m MV E/A ratio:  0.88       1.5       Systemic Diam: 1.90 cm  Buford Dresser MD Electronically signed by Buford Dresser MD Signature Date/Time: 09/25/2019/7:01:47 PM    Final     Scheduled Meds: . sodium chloride (PF)      . sodium chloride flush  3 mL Intravenous Q12H   Continuous Infusions: . sodium chloride 75 mL/hr at  09/26/19 1030  . sodium chloride    . heparin Stopped (09/26/19 1345)     LOS: 3 days    Time spent: 25 mins.    Shawna Clamp, MD [facility]   If 7PM-7AM, please contact night-coverage

## 2019-09-26 NOTE — Care Management Important Message (Signed)
Important Message  Patient Details IM Letter given to Marney Doctor RN Case Manager to present to the Patient Name: Emily Kim MRN: [id number] Date of Birth: 12-25-1940   Medicare Important Message Given:  Yes     Kerin Salen 09/26/2019, 10:07 AM

## 2019-09-26 NOTE — Progress Notes (Signed)
Subjective: The patient is seen and examined today.  She is a very pleasant 79 years old African-American female who has been complaining of worsening dyspnea and shortness of breath for 6 weeks before her admission.  The patient presented to the emergency department with syncopal episode as well as cough, shortness of breath and hemoptysis.  Imaging studies including CT angiogram scan of the chest showed acute segmental pulmonary emboli in the left lower lobe.  There was widely metastatic lung cancer involving a large ill-defined right hilar and suprahilar mass with bilateral pulmonary parenchymal metastases and lymphangitic carcinomatosis.  There was also multiple osseous metastasis.  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  The patient had similar resultant severe stenosis of the distal right pulmonary artery and narrowing of the right upper, middle and lower lobe bronchi with complete collapse of the right middle lobe.  There was moderate loculated right pleural effusion and small left pleural effusion.  CT of the head without contrast was unremarkable.  The patient was seen by Dr. Roxan Hockey and on September 25, 2019 she underwent bronchoscopy with endobronchial ultrasound and biopsy and mediastinal lymph node aspiration.  Based on the preliminary report with pathology, this is consistent with non-small cell lung cancer, adenocarcinoma.  The patient continues to have shortness of breath.  She was seen by radiation oncology and started the first fraction of radiotherapy to the large right hilar mass for relief of the SVC symptoms.  She denied having any current fever or chills.  She has no nausea, vomiting, diarrhea or constipation.  She continues to have mild hemoptysis.  Objective: Vital signs in last 24 hours: Temp:   [97.6 F (36.4 C)-98.6 F (37 C)] 98.2 F (36.8 C) (01/08 0536) Pulse Rate:  [94-102] 94 (01/08 0536) Resp:  [18-27] 18 (01/08 0536) BP: (137-163)/(62-95) 137/78 (01/08 0536) SpO2:  [94 %-100 %] 98 % (01/08 0536)  Intake/Output from previous day: 01/07 0701 - 01/08 0700 In: 2427.8 [P.O.:240; I.V.:2087.8] Out: -  Intake/Output this shift: No intake/output data recorded.  General appearance: alert, cooperative, fatigued and mild distress Resp: diminished breath sounds RLL and dullness to percussion RLL Cardio: regular rate and rhythm, S1, S2 normal, no murmur, click, rub or gallop GI: soft, non-tender; bowel sounds normal; no masses,  no organomegaly Extremities: extremities normal, atraumatic, no cyanosis or edema  Lab Results:  Recent Labs    09/25/19 0317 09/26/19 0434  WBC 11.7* 13.2*  HGB 10.9* 10.9*  HCT 33.4* 35.6*  PLT 212 203   BMET Recent Labs    09/24/19 1924 09/25/19 0317  NA 123* 126*  K 4.5 4.5  CL 91* 93*  CO2 17* 20*  GLUCOSE 98 116*  BUN 18 20  CREATININE 0.76 0.68  CALCIUM 9.1 9.2    Studies/Results: DG Chest 2 View  Result Date: 09/24/2019 CLINICAL DATA:  Preop evaluation EXAM: CHEST - 2 VIEW COMPARISON:  09/23/2019 FINDINGS: Cardiac shadows within normal limits. Right-sided pleural effusion is noted. Scattered pulmonary nodules are identified consistent with that seen on recent CT examination. Right paratracheal mass is noted consistent with known mass lesion. No acute bony abnormality is seen. IMPRESSION: Stable right-sided pleural effusion as well as pulmonary nodules. Persistent right paratracheal mass is noted as well. Electronically Signed   By: Inez Catalina M.D.   On: 09/24/2019  23:30   DG Chest Port 1 View  Result Date: 09/25/2019 CLINICAL DATA:  Status post bronchoscopy and biopsies today. EXAM: PORTABLE CHEST 1 VIEW COMPARISON:  PA and lateral chest earlier today. CT chest 09/23/2019. FINDINGS: There is no pneumothorax after bronchoscopy.  Right much greater than left pleural effusions again seen. Right suprahilar mass and multiple bilateral pulmonary nodules are again seen. Heart size is normal. Atherosclerosis noted. IMPRESSION: Negative for pneumothorax after bronchoscopy. No change in right suprahilar mass, right greater than left pleural effusions and multiple pulmonary nodules. Electronically Signed   By: Inge Rise M.D.   On: 09/25/2019 10:09   ECHOCARDIOGRAM COMPLETE  Result Date: 09/25/2019   ECHOCARDIOGRAM REPORT   Patient Name:   [name] Date of Exam: 09/25/2019 Medical Rec #:  [id number]     Height:       67.0 in Accession #:    [id number]    Weight:       185.4 lb Date of Birth:  06-Nov-1940     BSA:          1.96 m Patient Age:    29 years      BP:           142/82 mmHg Patient Gender: F             HR:           90 bpm. Exam Location:  Inpatient Procedure: 2D Echo, Color Doppler and Cardiac Doppler Indications:    I26.02 Pulmonary embolus  History:        Patient has no prior history of Echocardiogram examinations. SVC                 Syndrome.  Sonographer:    Raquel Sarna Senior RDCS Referring Phys: [place] Comments: Technically difficult study due to poor echo windows. IMPRESSIONS  1. Left ventricular ejection fraction, by visual estimation, is 55 to 60%. The left ventricle has normal function. There is no left ventricular hypertrophy.  2. Left ventricular diastolic parameters are indeterminate.  3. The left ventricle has no regional wall motion abnormalities.  4. Global right ventricle has normal systolic function.The right ventricular size is normal. No increase in right ventricular wall thickness.  5. Left atrial size was normal.  6. Right atrial size was normal.  7. Moderate pleural effusion in both left and right lateral regions.  8. Presence of pericardial fat pad.  9. Trivial pericardial effusion is present. 10. Mild mitral annular calcification. 11. The mitral valve is normal in structure.  Trivial mitral valve regurgitation. 12. The tricuspid valve is normal in structure. 13. The aortic valve is tricuspid. Aortic valve regurgitation is not visualized. Mild aortic valve sclerosis without stenosis. 14. The pulmonic valve was grossly normal. Pulmonic valve regurgitation is trivial. 15. Moderately elevated pulmonary artery systolic pressure. 16. The inferior vena cava is normal in size with greater than 50% respiratory variability, suggesting right atrial pressure of 3 mmHg. 17. Technically difficult study due to limited echo windows. No gross abnormalities seen, but reduced sensitivity due to technical limitations. FINDINGS  Left Ventricle: Left ventricular ejection fraction, by visual estimation, is 55 to 60%. The left ventricle has normal function. The left ventricle has no regional wall motion abnormalities. There is no left ventricular hypertrophy. Left ventricular diastolic parameters are indeterminate. Right Ventricle: The right ventricular size is normal. No increase in right ventricular wall thickness. Global RV systolic function is has normal systolic function. The tricuspid regurgitant  velocity is 3.19 m/s, and with an assumed right atrial pressure  of 3 mmHg, the estimated right ventricular systolic pressure is moderately elevated at 43.7 mmHg. Left Atrium: Left atrial size was normal in size. Right Atrium: Right atrial size was normal in size Pericardium: Trivial pericardial effusion is present. Presence of pericardial fat pad. There is a moderate pleural effusion in both left and right lateral regions. Mitral Valve: The mitral valve is normal in structure. There is mild thickening of the mitral valve leaflet(s). There is mild calcification of the mitral valve leaflet(s). Mild mitral annular calcification. Trivial mitral valve regurgitation. Tricuspid Valve: The tricuspid valve is normal in structure. Tricuspid valve regurgitation is mild. Aortic Valve: The aortic valve is tricuspid. . There  is mild thickening and mild calcification of the aortic valve. Aortic valve regurgitation is not visualized. Mild aortic valve sclerosis is present, with no evidence of aortic valve stenosis. There is mild thickening of the aortic valve. There is mild calcification of the aortic valve. Pulmonic Valve: The pulmonic valve was grossly normal. Pulmonic valve regurgitation is trivial. Pulmonic regurgitation is trivial. Aorta: The aortic root, ascending aorta and aortic arch are all structurally normal, with no evidence of dilitation or obstruction. Pulmonary Artery: The pulmonary artery is not well seen. Venous: The inferior vena cava is normal in size with greater than 50% respiratory variability, suggesting right atrial pressure of 3 mmHg. IAS/Shunts: No atrial level shunt detected by color flow Doppler.  LEFT VENTRICLE PLAX 2D LVIDd:         3.90 cm  Diastology LVIDs:         2.80 cm  LV e' lateral:   5.66 cm/s LV PW:         0.90 cm  LV E/e' lateral: 9.2 LV IVS:        1.10 cm  LV e' medial:    4.35 cm/s LVOT diam:     1.90 cm  LV E/e' medial:  12.0 LV SV:         36 ml LV SV Index:   18.04 LVOT Area:     2.84 cm  RIGHT VENTRICLE RV S prime:     18.10 cm/s TAPSE (M-mode): 1.6 cm LEFT ATRIUM             Index       RIGHT ATRIUM           Index LA diam:        2.60 cm 1.33 cm/m  RA Area:     11.30 cm LA Vol (A2C):   23.8 ml 12.15 ml/m RA Volume:   24.30 ml  12.41 ml/m LA Vol (A4C):   34.9 ml 17.82 ml/m LA Biplane Vol: 29.6 ml 15.12 ml/m  AORTIC VALVE LVOT Vmax:   86.80 cm/s LVOT Vmean:  65.000 cm/s LVOT VTI:    0.167 m  AORTA Ao Root diam: 2.50 cm MITRAL VALVE                        TRICUSPID VALVE MV Area (PHT): 2.87 cm             TR Peak grad:   40.7 mmHg MV PHT:        76.56 msec           TR Vmax:        319.00 cm/s MV Decel Time: 264 msec MV E velocity: 52.30 cm/s 103 cm/s  SHUNTS MV A velocity: 59.60  cm/s 70.3 cm/s Systemic VTI:  0.17 m MV E/A ratio:  0.88       1.5       Systemic Diam: 1.90 cm   Buford Dresser MD Electronically signed by Buford Dresser MD Signature Date/Time: 09/25/2019/7:01:47 PM    Final     Medications: I have reviewed the patient's current medications.  Assessment/Plan: This is a very pleasant 79 years old African-American female recently diagnosed with stage IV non-small cell lung cancer, adenocarcinoma based on the preliminary pathology.  She presented with widely metastatic disease involving right hilar and suprahilar mass as well as bilateral pulmonary metastases and lymphangitic carcinomatosis as well as multiple osseous metastasis with SVC syndrome. I had a lengthy discussion with the patient today about her condition. I will complete the staging work-up by ordering MRI of the brain as well as CT scan of the abdomen and pelvis to rule out any other metastatic disease. For the SVC syndrome and obstructive lesion on the right hilar and suprahilar area, the patient will continue with short course of palliative radiotherapy by radiation oncology. I asked the pathologist to send her tissue block for molecular studies by foundation 1 as well as PD-L1 expression. For the moderate loculated right pleural effusion, will consider the patient for ultrasound-guided right thoracentesis. I will arrange for the patient to have a follow-up appointment with me at the [facility] after discharge for more detailed discussion of her treatment options based on the final staging work-up and molecular studies. For the recently diagnosed acute pulmonary emboli, the patient is currently on heparin drip then she can switch to subcutaneous Lovenox after discharge and if this cannot be done for any logistic reason, the alternative will be treatment with Xarelto. Thank you so much for taking good care of Ms. Emily Kim.  I will continue to follow-up the patient with you and assist in her management as needed.  Disclaimer: This note was dictated with voice recognition  software. Similar sounding words can inadvertently be transcribed and may be missed upon review.   LOS: 3 days    Eilleen Kempf 09/26/2019

## 2019-09-26 NOTE — Progress Notes (Signed)
Pharmacy Brief Note - Anticoagulation Follow Up:  Patient on heparin infusion for acute PE.   Assessment:  HL = 0.14 is subtherapeutic. Pt currently on heparin infusion of 1100 units/hr, confirmed infusing at correct rate with RN with no issues.   Heparin infusion was paused from 1345 - 1750 this evening, so HL does not represent accurate level.   Goal: HL 0.3 - 0.7  Plan:  Continue heparin infusion at current rate of 1100 units/hr  Order 8 hour HL  HL and CBC daily  Lenis Noon, PharmD 09/26/19 9:18 PM

## 2019-09-26 NOTE — Procedures (Signed)
Ultrasound-guided diagnostic and therapeutic right thoracentesis performed yielding 900 cc of slightly hazy, amber fluid. No immediate complications. Follow-up chest x-ray pending. The fluid was sent to the lab for cytology. EBL none.

## 2019-09-26 NOTE — Progress Notes (Signed)
Oncology Nurse Navigator Documentation  Oncology Nurse Navigator Flowsheets 09/26/2019  Navigator Location [facility]  Navigator Encounter Type Molecular Studies;Other/Dr. Julien Nordmann updated me that patient's recent tissue was sent for Foundation One and PDL 1 testing.  I contacted pathology tech to make sure this was sent.  I was updated by Varney Biles that this was sent out today.    Treatment Phase Pre-Tx/Tx Discussion  Barriers/Navigation Needs Coordination of Care  Interventions Coordination of Care  Acuity Level 2-Minimal Needs (1-2 Barriers Identified)  Coordination of Care Other  Time Spent with Patient 30

## 2019-09-27 LAB — COMPREHENSIVE METABOLIC PANEL
ALT: 15 U/L (ref 0–44)
AST: 26 U/L (ref 15–41)
Albumin: 2.9 g/dL — ABNORMAL LOW (ref 3.5–5.0)
Alkaline Phosphatase: 146 U/L — ABNORMAL HIGH (ref 38–126)
Anion gap: 9 (ref 5–15)
BUN: 33 mg/dL — ABNORMAL HIGH (ref 8–23)
CO2: 20 mmol/L — ABNORMAL LOW (ref 22–32)
Calcium: 9.1 mg/dL (ref 8.9–10.3)
Chloride: 99 mmol/L (ref 98–111)
Creatinine, Ser: 0.91 mg/dL (ref 0.44–1.00)
GFR calc Af Amer: >60 mL/min (ref >60)
GFR calc non Af Amer: >60 mL/min (ref >60)
Glucose, Bld: 157 mg/dL — ABNORMAL HIGH (ref 70–99)
Potassium: 4.8 mmol/L (ref 3.5–5.1)
Sodium: 128 mmol/L — ABNORMAL LOW (ref 135–145)
Total Bilirubin: 0.7 mg/dL (ref 0.3–1.2)
Total Protein: 6.4 g/dL — ABNORMAL LOW (ref 6.5–8.1)

## 2019-09-27 LAB — MAGNESIUM: Magnesium: 2.3 mg/dL (ref 1.7–2.4)

## 2019-09-27 LAB — HEPARIN LEVEL (UNFRACTIONATED)
Heparin Unfractionated: 0.33 IU/mL (ref 0.30–0.70)
Heparin Unfractionated: 0.56 IU/mL (ref 0.30–0.70)

## 2019-09-27 LAB — CBC
HCT: 31.9 % — ABNORMAL LOW (ref 36.0–46.0)
Hemoglobin: 9.9 g/dL — ABNORMAL LOW (ref 12.0–15.0)
MCH: 26.2 pg (ref 26.0–34.0)
MCHC: 31 g/dL (ref 30.0–36.0)
MCV: 84.4 fL (ref 80.0–100.0)
Platelets: 183 10*3/uL (ref 150–400)
RBC: 3.78 MIL/uL — ABNORMAL LOW (ref 3.87–5.11)
RDW: 15.8 % — ABNORMAL HIGH (ref 11.5–15.5)
WBC: 11.6 10*3/uL — ABNORMAL HIGH (ref 4.0–10.5)
nRBC: 0.2 % (ref 0.0–0.2)

## 2019-09-27 LAB — PHOSPHORUS: Phosphorus: 2.8 mg/dL (ref 2.5–4.6)

## 2019-09-27 NOTE — Progress Notes (Addendum)
Pt transferred to telemetry unit with all belongings.  Report called to receiving RN. Pt's son, [name] was made aware of pt's transfer.

## 2019-09-27 NOTE — Progress Notes (Signed)
PROGRESS NOTE    Emily Kim  CXK:[id number] DOB: [birth year] DOA: 09/23/2019 PCP: Patient, No Pcp Per   Brief Narrative:  Patientis a 79 year old African-American female with past medical history significant for uterine cancer s/p hysterectomy [place], [place]), apparently notes 50lbs of weight loss over the past 3 month and reformed tobacco user.  Apparently, patient smoked 1 pack of cigarettes daily for 15 years, but quit about 30 years ago.  Patient presented with shortness of breath and hemoptysis intermittently over the past 2 weeks.  CTA chest done on 09/22/2018 revealed acute segmental pulmonary emboli in the left lobe with likely metastatic lung cancer (CTA chest finding is documented below).  Patient is currently on heparin drip.  Patient underwent video bronchoscopy with endotracheal ultrasound for needle aspirations, brushings and endobronchial biopsies. Preliminary biopsy is said to be suggestive of non-small cell lung cancer, metastatic.  Patient was seen by the radiation oncology team and they started palliative radiotherapy yesterday for severe superior vena cava syndrome.  Patient being transferred from [facility].  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  -Resumed heparin as per radiation oncology protocol.   -Pulmonary emboli is likely related to metastatic lung  cancer.  Metastatic lung cancer with lymphangitic spread, bone metastasis-loculated right pleural effusion likely represents a malignant effusion / Weight loss 50 pound weight loss -Surgery consulted-recommending bronchoscopy and endobronchial ultrasound on 1/7 after holding heparin for 6 hours 09/25/2019: Kindly see above.  Patient underwent diagnostic procedure by the cardiothoracic team.  Preliminary diagnosis is suggestive of metastatic non small cell lung cancer.  SVC syndrome as previously documented.  For radiation therapy today. Onco: Recommends staging work-up ( MRI of the brain as well as CT scan of the abdomen and pelvis to rule out any other metastatic disease). SVC syndrome:Continue with short course of palliative radiotherapy by radiation oncology. Moderate loculated right pleural effusion, underwent ultrasound-guided right thoracentesis , 900 cc fluid drained, sent for cytology. MRI, brain: No meds, CT abdomen and pelvis: Mets in the liver, right adrenal and spine.  Severe hyponatremia: Improving  -Serum osmolality is 259 -Urine sodium and urine osmolality ordered yesterday but have not yet been sent- patient  urinated about 1 hr ago into a bedpan 09/27/2019: Sodium today is 128.  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  Severe SVC stenosis with collaterals - swelling of face noted. 09/25/2019: 4 palliative radiation therapy daily started yesterday.  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 Family Communication:  None  At bed side. Disposition Plan: will be decided.  Consultants:   CT surgery  Hematology  Procedures:  Antimicrobials:  Anti-infectives (From admission, onward)   None     Subjective: Seen and examined at bedside, denies any overnight events. She reports feeling better.  Objective: Vitals:   09/26/19 1618 09/26/19 2055 09/27/19 0618 09/27/19 0949  BP: (!) 195/91 (!)  144/60 Marland Kitchen)  108/59   Pulse:  91 85   Resp:      Temp:   98.5 F (36.9 C)   TempSrc:   Oral   SpO2:   98%   Weight:    87.5 kg  Height:        Intake/Output Summary (Last 24 hours) at 09/27/2019 1402 Last data filed at 09/27/2019 1300 Gross per 24 hour  Intake 2565.05 ml  Output 300 ml  Net 2265.05 ml   Filed Weights   09/23/19 2100 09/25/19 0542 09/27/19 0949  Weight: 74.8 kg 84.1 kg 87.5 kg    Examination:  General exam: Appears calm and comfortable  Respiratory system: Clear to auscultation. Respiratory effort normal. Cardiovascular system: S1 & S2 heard, RRR. No JVD, murmurs, rubs, gallops or clicks. No pedal edema. Gastrointestinal system: Abdomen is nondistended, soft and nontender. No organomegaly or masses felt. Normal bowel sounds heard. Central nervous system: Alert and oriented. No focal neurological deficits. Extremities: No edema, swelling. Skin: No rashes, lesions or ulcers Psychiatry: Judgement and insight appear normal. Mood & affect appropriate.    Data Reviewed: I have personally reviewed following labs and imaging studies  CBC: Recent Labs  Lab 09/23/19 0507 09/24/19 0500 09/25/19 0317 09/26/19 0434 09/27/19 0232  WBC 7.1 8.0 11.7* 13.2* 11.6*  HGB 11.0* 10.9* 10.9* 10.9* 9.9*  HCT 33.0* 33.7* 33.4* 35.6* 31.9*  MCV 80.3 79.9* 78.8* 84.0 84.4  PLT 222 199 212 203 119   Basic Metabolic Panel: Recent Labs  Lab 09/23/19 0507 09/24/19 0500 09/24/19 1924 09/25/19 0317 09/27/19 0232  NA 121* 122* 123* 126* 128*  K 4.4 4.7 4.5 4.5 4.8  CL 87* 88* 91* 93* 99  CO2 21* 17* 17* 20* 20*  GLUCOSE 103* 95 98 116* 157*  BUN 15 18 18 20  33*  CREATININE 0.86 0.83 0.76 0.68 0.91  CALCIUM 9.1 9.2 9.1 9.2 9.1  MG  --   --   --   --  2.3  PHOS  --   --   --   --  2.8   GFR: Estimated Creatinine Clearance: 57.9 mL/min (by C-G formula based on SCr of 0.91 mg/dL). Liver Function Tests: Recent Labs  Lab 09/24/19 0500 09/27/19 0232  AST 25 26  ALT 14  15  ALKPHOS 172* 146*  BILITOT 0.6 0.7  PROT 7.1 6.4*  ALBUMIN 3.1* 2.9*   No results for input(s): LIPASE, AMYLASE in the last 168 hours. No results for input(s): AMMONIA in the last 168 hours. Coagulation Profile: Recent Labs  Lab 09/24/19 1651  INR 1.3*   Cardiac Enzymes: No results for input(s): CKTOTAL, CKMB, CKMBINDEX, TROPONINI in the last 168 hours. BNP (last 3 results) No results for input(s): PROBNP in the last 8760 hours. HbA1C: No results for input(s): HGBA1C in the last 72 hours. CBG: No results for input(s): GLUCAP in the last 168 hours. Lipid Profile: No results for input(s): CHOL, HDL, LDLCALC, TRIG, CHOLHDL, LDLDIRECT in the last 72 hours. Thyroid Function Tests: No results for input(s): TSH, T4TOTAL, FREET4, T3FREE, THYROIDAB in the last 72 hours. Anemia Panel: No results for input(s): VITAMINB12, FOLATE, FERRITIN, TIBC, IRON, RETICCTPCT in the last 72 hours. Sepsis Labs: No results for input(s): PROCALCITON, LATICACIDVEN in the last 168 hours.  Recent Results (from the past 240 hour(s))  SARS CORONAVIRUS 2 (TAT 6-24 HRS) Nasopharyngeal Nasopharyngeal Swab     Status: None   Collection Time: 09/23/19  2:51 PM   Specimen: Nasopharyngeal Swab  Result Value Ref  Range Status   SARS Coronavirus 2 NEGATIVE NEGATIVE Final    Comment: (NOTE) SARS-CoV-2 target nucleic acids are NOT DETECTED. The SARS-CoV-2 RNA is generally detectable in upper and lower respiratory specimens during the acute phase of infection. Negative results do not preclude SARS-CoV-2 infection, do not rule out co-infections with other pathogens, and should not be used as the sole basis for treatment or other patient management decisions. Negative results must be combined with clinical observations, patient history, and epidemiological information. The expected result is Negative. Fact Sheet for Patients: SugarRoll.be Fact Sheet for Healthcare  Providers: https://www.woods-mathews.com/ This test is not yet approved or cleared by the [place] FDA and  has been authorized for detection and/or diagnosis of SARS-CoV-2 by FDA under an Emergency Use Authorization (EUA). This EUA will remain  in effect (meaning this test can be used) for the duration of the COVID-19 declaration under Section 56 4(b)(1) of the Act, 21 U.S.C. section 360bbb-3(b)(1), unless the authorization is terminated or revoked sooner. Performed at [facility], [place] [address] [place], [place] [address]      Radiology Studies: MR BRAIN W WO CONTRAST  Result Date: 09/26/2019 CLINICAL DATA:  79 year old female with recently diagnosed non-small cell lung cancer. Staging. EXAM: MRI HEAD WITHOUT AND WITH CONTRAST TECHNIQUE: Multiplanar, multiecho pulse sequences of the brain and surrounding structures were obtained without and with intravenous contrast. CONTRAST:  62mL GADAVIST GADOBUTROL 1 MMOL/ML IV SOLN COMPARISON:  Chest CTA 09/23/2019. Head CT 09/23/2019. FINDINGS: Brain: No abnormal enhancement identified. Some postcontrast images are degraded by motion. No midline shift, mass effect, or evidence of intracranial mass lesion. No dural thickening. No restricted diffusion to suggest acute infarction. No ventriculomegaly, extra-axial collection or acute intracranial hemorrhage. Cervicomedullary junction and pituitary are within normal limits. Mild to moderate for age scattered and patchy bilateral cerebral white matter T2 and FLAIR hyperintensity. No cortical encephalomalacia or chronic cerebral blood products. The deep gray matter nuclei, brainstem and cerebellum appear normal for age. Vascular: Major intracranial vascular flow voids are preserved. Major dural venous sinuses are enhancing and appear to be patent. Skull and upper cervical spine: Negative visible cervical spine and spinal cord. Bone marrow signal in the skull is mildly heterogeneous, and there  is heterogeneous enhancement in the clivus (series 13, image 11), although no overtly destructive osseous lesion. Sinuses/Orbits: Negative orbits. Right maxillary sinus mucous retention cyst. Other: Visible internal auditory structures appear normal. Mastoids are clear. Scalp and face soft tissues appear negative. IMPRESSION: 1. No acute intracranial abnormality or metastatic disease to the brain. 2. Heterogeneous skull marrow signal, including in the clivus. Osseous metastatic disease not excluded. 3. Mild to moderate for age cerebral white matter signal changes, most commonly due to chronic small vessel disease. Electronically Signed   By: Genevie Ann M.D.   On: 09/26/2019 11:56   CT ABDOMEN PELVIS W CONTRAST  Result Date: 09/26/2019 CLINICAL DATA:  Inpatient. New diagnosis of metastatic right upper lobe lung cancer. Staging evaluation. Additional history of hysterectomy for uterine cancer. EXAM: CT ABDOMEN AND PELVIS WITH CONTRAST TECHNIQUE: Multidetector CT imaging of the abdomen and pelvis was performed using the standard protocol following bolus administration of intravenous contrast. CONTRAST:  118mL OMNIPAQUE IOHEXOL 300 MG/ML  SOLN COMPARISON:  09/23/2019 chest CT angiogram. FINDINGS: Lower chest: Redemonstration of numerous solid pulmonary nodules scattered throughout both lung bases measuring up to 9 mm in the left lower lobe (series 4/image 26) and 11 mm in the medial right lower lobe (series 4/image  11), not appreciably changed since 09/23/2019 chest CT. Small dependent bilateral pleural effusions, left greater than right, decreased on the right and increased on the left since 09/23/2019 chest CT. Hepatobiliary: Three similar hypodense scattered liver masses measuring 2.4 cm in segment 2 (series 2/image 21), 1.7 cm in segment 3 (series 2/image 30) and 1.6 cm in segment 8 (series 2/image 15). Normal gallbladder with no radiopaque cholelithiasis. No biliary ductal dilatation. Pancreas: Normal, with no mass  or duct dilation. Spleen: Normal size. No mass. Adrenals/Urinary Tract: Hypodense 1.5 cm right adrenal nodule (series 2/image 26) with density 34 HU. No discrete left adrenal nodule. Simple 2.6 cm interpolar right renal cyst. Subcentimeter hypodense upper left renal cortical lesion, too small to characterize. No hydronephrosis. Normal bladder. Stomach/Bowel: Normal non-distended stomach. Normal caliber small bowel with no small bowel wall thickening. Normal appendix. Normal large bowel with no diverticulosis, large bowel wall thickening or pericolonic fat stranding. Vascular/Lymphatic: Atherosclerotic nonaneurysmal abdominal aorta. Patent portal, splenic, hepatic and renal veins. No pathologically enlarged lymph nodes in the abdomen or pelvis. Reproductive: Status post hysterectomy, with no abnormal findings at the vaginal cuff. No adnexal mass. Other: No pneumoperitoneum, ascites or focal fluid collection. Mild to moderate anasarca. Musculoskeletal: Widespread sclerotic osseous lesions throughout the lumbar spine (most prominent in the L3 vertebral body), left sacrum, bilateral iliac bones, posterior right acetabulum, right pubic bone and left femoral neck. Moderate lumbar spondylosis. IMPRESSION: 1. Three similar scattered indeterminate hypodense liver masses, largest 2.4 cm in segment 2, suspicious for liver metastases. 2. Indeterminate 1.5 cm right adrenal nodule, suspicious for right adrenal metastasis. 3. Widespread sclerotic osseous metastases throughout lumbar spine, sacrum and bilateral pelvic girdle, including in the left femoral neck predisposing to pathologic fracture. 4. Redemonstration of widespread pulmonary metastases at both lung bases. Small dependent left pleural effusion has increased. Small dependent right pleural effusion has decreased. 5.  Aortic Atherosclerosis (ICD10-I70.0). Electronically Signed   By: Ilona Sorrel M.D.   On: 09/26/2019 19:19   DG Chest Port 1 View  Result Date:  09/26/2019 CLINICAL DATA:  Status post right thoracentesis EXAM: PORTABLE CHEST 1 VIEW COMPARISON:  09/25/2019 FINDINGS: Cardiac shadow is stable. Aortic calcifications are again seen. Soft tissue density is noted in the right paratracheal region consistent with the patient's known history of lung carcinoma. Scattered nodular changes are noted within the left lung. Interval decrease in right-sided pleural effusion is noted. No pneumothorax is seen. IMPRESSION: No pneumothorax is noted. Changes consistent with the known history of lung carcinoma. Electronically Signed   By: Inez Catalina M.D.   On: 09/26/2019 17:03   ECHOCARDIOGRAM COMPLETE  Result Date: 09/25/2019   ECHOCARDIOGRAM REPORT   Patient Name:   Emily Kim Date of Exam: 09/25/2019 Medical Rec #:  [id number]     Height:       67.0 in Accession #:    [id number]    Weight:       185.4 lb Date of Birth:  Jan 30, 1941     BSA:          1.96 m Patient Age:    73 years      BP:           142/82 mmHg Patient Gender: F             HR:           90 bpm. Exam Location:  Inpatient Procedure: 2D Echo, Color Doppler and Cardiac Doppler Indications:    I26.02 Pulmonary embolus  History:  Patient has no prior history of Echocardiogram examinations. SVC                 Syndrome.  Sonographer:    Raquel Sarna Senior RDCS Referring Phys: [place] Comments: Technically difficult study due to poor echo windows. IMPRESSIONS  1. Left ventricular ejection fraction, by visual estimation, is 55 to 60%. The left ventricle has normal function. There is no left ventricular hypertrophy.  2. Left ventricular diastolic parameters are indeterminate.  3. The left ventricle has no regional wall motion abnormalities.  4. Global right ventricle has normal systolic function.The right ventricular size is normal. No increase in right ventricular wall thickness.  5. Left atrial size was normal.  6. Right atrial size was normal.  7. Moderate pleural effusion in both  left and right lateral regions.  8. Presence of pericardial fat pad.  9. Trivial pericardial effusion is present. 10. Mild mitral annular calcification. 11. The mitral valve is normal in structure. Trivial mitral valve regurgitation. 12. The tricuspid valve is normal in structure. 13. The aortic valve is tricuspid. Aortic valve regurgitation is not visualized. Mild aortic valve sclerosis without stenosis. 14. The pulmonic valve was grossly normal. Pulmonic valve regurgitation is trivial. 15. Moderately elevated pulmonary artery systolic pressure. 16. The inferior vena cava is normal in size with greater than 50% respiratory variability, suggesting right atrial pressure of 3 mmHg. 17. Technically difficult study due to limited echo windows. No gross abnormalities seen, but reduced sensitivity due to technical limitations. FINDINGS  Left Ventricle: Left ventricular ejection fraction, by visual estimation, is 55 to 60%. The left ventricle has normal function. The left ventricle has no regional wall motion abnormalities. There is no left ventricular hypertrophy. Left ventricular diastolic parameters are indeterminate. Right Ventricle: The right ventricular size is normal. No increase in right ventricular wall thickness. Global RV systolic function is has normal systolic function. The tricuspid regurgitant velocity is 3.19 m/s, and with an assumed right atrial pressure  of 3 mmHg, the estimated right ventricular systolic pressure is moderately elevated at 43.7 mmHg. Left Atrium: Left atrial size was normal in size. Right Atrium: Right atrial size was normal in size Pericardium: Trivial pericardial effusion is present. Presence of pericardial fat pad. There is a moderate pleural effusion in both left and right lateral regions. Mitral Valve: The mitral valve is normal in structure. There is mild thickening of the mitral valve leaflet(s). There is mild calcification of the mitral valve leaflet(s). Mild mitral annular  calcification. Trivial mitral valve regurgitation. Tricuspid Valve: The tricuspid valve is normal in structure. Tricuspid valve regurgitation is mild. Aortic Valve: The aortic valve is tricuspid. . There is mild thickening and mild calcification of the aortic valve. Aortic valve regurgitation is not visualized. Mild aortic valve sclerosis is present, with no evidence of aortic valve stenosis. There is mild thickening of the aortic valve. There is mild calcification of the aortic valve. Pulmonic Valve: The pulmonic valve was grossly normal. Pulmonic valve regurgitation is trivial. Pulmonic regurgitation is trivial. Aorta: The aortic root, ascending aorta and aortic arch are all structurally normal, with no evidence of dilitation or obstruction. Pulmonary Artery: The pulmonary artery is not well seen. Venous: The inferior vena cava is normal in size with greater than 50% respiratory variability, suggesting right atrial pressure of 3 mmHg. IAS/Shunts: No atrial level shunt detected by color flow Doppler.  LEFT VENTRICLE PLAX 2D LVIDd:         3.90 cm  Diastology  LVIDs:         2.80 cm  LV e' lateral:   5.66 cm/s LV PW:         0.90 cm  LV E/e' lateral: 9.2 LV IVS:        1.10 cm  LV e' medial:    4.35 cm/s LVOT diam:     1.90 cm  LV E/e' medial:  12.0 LV SV:         36 ml LV SV Index:   18.04 LVOT Area:     2.84 cm  RIGHT VENTRICLE RV S prime:     18.10 cm/s TAPSE (M-mode): 1.6 cm LEFT ATRIUM             Index       RIGHT ATRIUM           Index LA diam:        2.60 cm 1.33 cm/m  RA Area:     11.30 cm LA Vol (A2C):   23.8 ml 12.15 ml/m RA Volume:   24.30 ml  12.41 ml/m LA Vol (A4C):   34.9 ml 17.82 ml/m LA Biplane Vol: 29.6 ml 15.12 ml/m  AORTIC VALVE LVOT Vmax:   86.80 cm/s LVOT Vmean:  65.000 cm/s LVOT VTI:    0.167 m  AORTA Ao Root diam: 2.50 cm MITRAL VALVE                        TRICUSPID VALVE MV Area (PHT): 2.87 cm             TR Peak grad:   40.7 mmHg MV PHT:        76.56 msec           TR Vmax:         319.00 cm/s MV Decel Time: 264 msec MV E velocity: 52.30 cm/s 103 cm/s  SHUNTS MV A velocity: 59.60 cm/s 70.3 cm/s Systemic VTI:  0.17 m MV E/A ratio:  0.88       1.5       Systemic Diam: 1.90 cm  Buford Dresser MD Electronically signed by Buford Dresser MD Signature Date/Time: 09/25/2019/7:01:47 PM    Final    US THORACENTESIS ASP PLEURAL SPACE W/IMG GUIDE  Result Date: 09/26/2019 INDICATION: Patient with history of metastatic lung cancer, right pleural effusion. Request made for diagnostic and therapeutic right thoracentesis. EXAM: ULTRASOUND GUIDED DIAGNOSTIC AND THERAPEUTIC RIGHT THORACENTESIS MEDICATIONS: None COMPLICATIONS: None immediate. PROCEDURE: An ultrasound guided thoracentesis was thoroughly discussed with the patient and questions answered. The benefits, risks, alternatives and complications were also discussed. The patient understands and wishes to proceed with the procedure. Written consent was obtained. Ultrasound was performed to localize and mark an adequate pocket of fluid in the right chest. The area was then prepped and draped in the normal sterile fashion. 1% Lidocaine was used for local anesthesia. Under ultrasound guidance a 6 Fr Safe-T-Centesis catheter was introduced. Thoracentesis was performed. The catheter was removed and a dressing applied. FINDINGS: A total of approximately 900 cc of slightly hazy, amber fluid was removed. Samples were sent to the laboratory as requested by the clinical team. IMPRESSION: Successful ultrasound guided diagnostic and therapeutic right thoracentesis yielding 900 cc of pleural fluid. Read by: Rowe Robert, PA-C Electronically Signed   By: Aletta Edouard M.D.   On: 09/26/2019 16:38    Scheduled Meds: . sodium chloride flush  3 mL Intravenous Q12H   Continuous Infusions: . sodium chloride  75 mL/hr at 09/27/19 1300  . sodium chloride    . heparin 1,100 Units/hr (09/27/19 1300)     LOS: 4 days    Time spent: 25  mins.    Shawna Clamp, MD [facility]   If 7PM-7AM, please contact night-coverage

## 2019-09-27 NOTE — Progress Notes (Signed)
ANTICOAGULATION CONSULT NOTE  Pharmacy Consult for heparin Indication: pulmonary embolus   Patient Measurements: Heparin Dosing Weight: 74 kg  Vital Signs: Temp: 98.5 F (36.9 C) (01/09 0618) Temp Source: Oral (01/09 0618) BP: 108/59 (01/09 0618) Pulse Rate: 85 (01/09 0618)  Labs: Recent Labs    09/24/19 1651 09/24/19 1924 09/25/19 0317 09/26/19 0434 09/26/19 2024 09/27/19 0232 09/27/19 1031  HGB  --   --  10.9* 10.9*  --  9.9*  --   HCT  --   --  33.4* 35.6*  --  31.9*  --   PLT  --   --  212 203  --  183  --   LABPROT 15.9*  --   --   --   --   --   --   INR 1.3*  --   --   --   --   --   --   HEPARINUNFRC  --   --   --   --  0.14* 0.33 0.56  CREATININE  --  0.76 0.68  --   --  0.91  --    Assessment: 79 y.o. female continues on IV heparin for PE.  Heparin to resume 09/26/19 at noon (24 hours post procedure)  Today, 09/27/19  Heparin level therapeutic now x 2 lab draws on current IV heparin rate of 1100 units/hr  Hgb 9.9 down from 10.9. Plts 183 down from 203  No reported bleeding or issures  Goal of Therapy:  Heparin level 0.3-0.7 units/ml Monitor platelets by anticoagulation protocol: Yes   Plan:   Continue IV heparin at at current rate of 1100 units/hr  Daily CBC and heparin level  Will await long term anticoag plan per notes   Kara Mead 09/27/2019,11:58 AM

## 2019-09-27 NOTE — Progress Notes (Signed)
Pharmacy Brief Note - Anticoagulation Follow Up:  Patient on heparin infusion for acute PE.   Assessment:  HL now therapeutic on 1100 units/hr, after true 8-hr level   No documented bleeding or infusion issues  Goal: HL 0.3 - 0.7  Plan:  Continue IV heparin at 1100 units/hr  Confirmatory heparin level in 8 hr  HL and CBC daily  Miaya Lafontant A, PharmD 09/27/19 4:24 AM

## 2019-09-28 LAB — CBC
HCT: 30 % — ABNORMAL LOW (ref 36.0–46.0)
Hemoglobin: 9.5 g/dL — ABNORMAL LOW (ref 12.0–15.0)
MCH: 25.8 pg — ABNORMAL LOW (ref 26.0–34.0)
MCHC: 31.7 g/dL (ref 30.0–36.0)
MCV: 81.5 fL (ref 80.0–100.0)
Platelets: 216 10*3/uL (ref 150–400)
RBC: 3.68 MIL/uL — ABNORMAL LOW (ref 3.87–5.11)
RDW: 15.6 % — ABNORMAL HIGH (ref 11.5–15.5)
WBC: 9.4 10*3/uL (ref 4.0–10.5)
nRBC: 0.4 % — ABNORMAL HIGH (ref 0.0–0.2)

## 2019-09-28 LAB — COMPREHENSIVE METABOLIC PANEL
ALT: 14 U/L (ref 0–44)
AST: 25 U/L (ref 15–41)
Albumin: 2.5 g/dL — ABNORMAL LOW (ref 3.5–5.0)
Alkaline Phosphatase: 141 U/L — ABNORMAL HIGH (ref 38–126)
Anion gap: 7 (ref 5–15)
BUN: 27 mg/dL — ABNORMAL HIGH (ref 8–23)
CO2: 22 mmol/L (ref 22–32)
Calcium: 8.9 mg/dL (ref 8.9–10.3)
Chloride: 102 mmol/L (ref 98–111)
Creatinine, Ser: 0.8 mg/dL (ref 0.44–1.00)
GFR calc Af Amer: >60 mL/min (ref >60)
GFR calc non Af Amer: >60 mL/min (ref >60)
Glucose, Bld: 163 mg/dL — ABNORMAL HIGH (ref 70–99)
Potassium: 5.2 mmol/L — ABNORMAL HIGH (ref 3.5–5.1)
Sodium: 131 mmol/L — ABNORMAL LOW (ref 135–145)
Total Bilirubin: 0.7 mg/dL (ref 0.3–1.2)
Total Protein: 6.1 g/dL — ABNORMAL LOW (ref 6.5–8.1)

## 2019-09-28 LAB — MAGNESIUM: Magnesium: 2 mg/dL (ref 1.7–2.4)

## 2019-09-28 LAB — POTASSIUM: Potassium: 5.2 mmol/L — ABNORMAL HIGH (ref 3.5–5.1)

## 2019-09-28 LAB — HEPARIN LEVEL (UNFRACTIONATED): Heparin Unfractionated: 0.44 IU/mL (ref 0.30–0.70)

## 2019-09-28 LAB — PHOSPHORUS: Phosphorus: 2.4 mg/dL — ABNORMAL LOW (ref 2.5–4.6)

## 2019-09-28 MED ORDER — K PHOS MONO-SOD PHOS DI & MONO 155-852-130 MG PO TABS
250.0000 mg | ORAL_TABLET | Freq: Three times a day (TID) | ORAL | Status: DC
  Administered 2019-09-28 (×2): 250 mg via ORAL
  Filled 2019-09-28 (×4): qty 1

## 2019-09-28 NOTE — Progress Notes (Signed)
ANTICOAGULATION CONSULT NOTE  Pharmacy Consult for heparin Indication: pulmonary embolus   Patient Measurements: Heparin Dosing Weight: 74 kg  Vital Signs: Temp: 98.2 F (36.8 C) (01/10 0514) Temp Source: Oral (01/10 0514) BP: 140/53 (01/10 0514) Pulse Rate: 88 (01/10 0514)  Labs: Recent Labs    09/26/19 0434 09/27/19 0232 09/27/19 1031 09/28/19 0510 09/28/19 0536  HGB 10.9* 9.9*  --   --  9.5*  HCT 35.6* 31.9*  --   --  30.0*  PLT 203 183  --   --  216  HEPARINUNFRC  --  0.33 0.56 0.44  --   CREATININE  --  0.91  --   --   --    Assessment: 79 y.o. female continues on IV heparin for PE.  Heparin to resume 09/26/19 at noon (24 hours post procedure)  Today, 09/28/19  Heparin level 0.44 this am on 1100 units/hr  Hgb 9.5 down from 11 on admit. Plts 216  No reported bleeding or infusion issues  Goal of Therapy:  Heparin level 0.3-0.7 units/ml Monitor platelets by anticoagulation protocol: Yes   Plan:   Continue IV heparin at 1100 units/hr  Daily CBC and heparin level  Per Onc note 1/8: recommend Lovenox, if Lovenox problematic use Xarelto  Minda Ditto PharmD 09/28/2019,7:56 AM

## 2019-09-28 NOTE — Progress Notes (Signed)
Both of patients arms are very swollen and cold to touch. Pt does not report any pain in arms. Doppler used and detected radial pulses. Dr. Dwyane Dee made aware. Arms elevated on pillows. Will continue with plan of care.

## 2019-09-28 NOTE — Progress Notes (Signed)
This RN has assumed care over the pt at this time. Agree w/ previous nurse's assessment.

## 2019-09-28 NOTE — Progress Notes (Signed)
PROGRESS NOTE    Emily Kim  PPI:[id number] DOB: 02/28/1941 DOA: 09/23/2019 PCP: Patient, No Pcp Per   Brief Narrative:  Patientis a 79 year old African-American female with past medical history significant for uterine cancer s/p hysterectomy [place], [place]), apparently notes 50lbs of weight loss over the past 3 month and reformed tobacco user.  Apparently, patient smoked 1 pack of cigarettes daily for 15 years, but quit about 30 years ago.  Patient presented with shortness of breath and hemoptysis intermittently over the past 2 weeks.  CTA chest done on 09/22/2018 revealed acute segmental pulmonary emboli in the left lobe with likely metastatic lung cancer (CTA chest finding is documented below).  Patient is currently on heparin drip.  Patient underwent video bronchoscopy with endotracheal ultrasound for needle aspirations, brushings and endobronchial biopsies. Preliminary biopsy is said to be suggestive of non-small cell lung cancer, metastatic.  Patient was seen by the radiation oncology team and they started palliative radiotherapy yesterday for severe superior vena cava syndrome.  Patient being transferred from [facility].   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  Assessment & Plan:   Principal Problem:   Pulmonary embolism (HCC) Active Problems:   SVC syndrome   Pleural effusion   Adenocarcinoma of right lung, stage 4 (HCC)  Pulmonary emboli: -Patient has been on heparin gtt.  -Resumed heparin as per radiation oncology protocol.    -Pulmonary emboli is likely related to metastatic lung cancer. - She can switch to subcutaneous Lovenox after discharge and if this cannot be done for any logistic reason, the alternative will be treatment with Xarelto  Metastatic lung cancer with lymphangitic spread, bone metastasis-loculated right pleural effusion likely represents a malignant effusion / Weight loss 50 pound weight loss -Surgery consulted-recommending bronchoscopy and endobronchial ultrasound on 1/7 after holding heparin for 6 hours 09/25/2019: Kindly see above.  Patient underwent diagnostic procedure by the cardiothoracic team.  Preliminary diagnosis is suggestive of metastatic non small cell lung cancer.  SVC syndrome as previously documented.  suggest palliative radiation therapy daily. Onco: Recommends staging work-up ( MRI of the brain as well as CT scan of the abdomen and pelvis to rule out any other metastatic disease). SVC syndrome: Continue with short course of palliative radiotherapy by radiation oncology. Moderate loculated right pleural effusion, underwent ultrasound-guided right thoracentesis , 900 cc fluid drained, sent for cytology. MRI, brain: No mets, CT abdomen and pelvis: Mets in the liver, right adrenal and spine.  Severe hyponatremia: Improving  -Serum osmolality is 259 -Urine sodium and urine osmolality ordered yesterday but have not yet been sent- patient  urinated about 1 hr ago into a bedpan 09/27/2019: Sodium today is 128.  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  Severe SVC stenosis with collaterals - swelling of face noted. 09/25/2019: 4 palliative radiation therapy daily started yesterday.  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 Family Communication:  None  At bed side. Disposition Plan: will be decided.  Consultants:   CT surgery  Hematology  Procedures:  Antimicrobials:  Anti-infectives (From  admission, onward)   None     Subjective: Seen and examined at bedside, denies any overnight events.  Her both arms are swollen but denies any pain.  Objective: Vitals:   09/27/19 1446 09/27/19 2127 09/28/19 0514 09/28/19 1205  BP: (!) 139/56 (!) 141/72 (!) 140/53 (!) 196/75  Pulse: 88 85 88 94  Resp: 20 20 20 18   Temp: 98 F (36.7 C) 98.2 F (36.8 C) 98.2 F (36.8 C) 97.7 F (36.5 C)  TempSrc: Oral Oral Oral Oral  SpO2: 94% 100% 100% 99%  Weight:      Height:        Intake/Output Summary (Last 24 hours) at 09/28/2019 1338 Last data filed at 09/28/2019 1111 Gross per 24 hour  Intake 2016.57 ml  Output 1400 ml  Net 616.57 ml   Filed Weights   09/23/19 2100 09/25/19 0542 09/27/19 0949  Weight: 74.8 kg 84.1 kg 87.5 kg    Examination:  General exam: Appears calm and comfortable  Respiratory system: Clear to auscultation. Respiratory effort normal. Cardiovascular system: S1 & S2 heard, RRR. No JVD, murmurs, rubs, gallops or clicks. No pedal edema. Gastrointestinal system: Abdomen is nondistended, soft and nontender. No organomegaly or masses felt. Normal bowel sounds heard. Central nervous system: Alert and oriented. No focal neurological deficits. Extremities: Both arms swollen. Skin: No rashes, lesions or ulcers Psychiatry: Judgement and insight appear normal. Mood & affect appropriate.    Data Reviewed: I have personally reviewed following labs and imaging studies  CBC: Recent Labs  Lab 09/24/19 0500 09/25/19 0317 09/26/19 0434 09/27/19 0232 09/28/19 0536  WBC 8.0 11.7* 13.2* 11.6* 9.4  HGB 10.9* 10.9* 10.9* 9.9* 9.5*  HCT 33.7* 33.4* 35.6* 31.9* 30.0*  MCV 79.9* 78.8* 84.0 84.4 81.5  PLT 199 212 203 183 314   Basic Metabolic Panel: Recent Labs  Lab 09/24/19 0500 09/24/19 1924 09/25/19 0317 09/27/19 0232 09/28/19 0955  NA 122* 123* 126* 128* 131*  K 4.7 4.5 4.5 4.8 5.2*  CL 88* 91* 93* 99 102  CO2 17* 17* 20* 20* 22  GLUCOSE 95 98 116* 157* 163*   BUN 18 18 20  33* 27*  CREATININE 0.83 0.76 0.68 0.91 0.80  CALCIUM 9.2 9.1 9.2 9.1 8.9  MG  --   --   --  2.3 2.0  PHOS  --   --   --  2.8 2.4*   GFR: Estimated Creatinine Clearance: 65.9 mL/min (by C-G formula based on SCr of 0.8 mg/dL). Liver Function Tests: Recent Labs  Lab 09/24/19 0500 09/27/19 0232 09/28/19 0955  AST 25 26 25   ALT 14 15 14   ALKPHOS 172* 146* 141*  BILITOT 0.6 0.7 0.7  PROT 7.1 6.4* 6.1*  ALBUMIN 3.1* 2.9* 2.5*   No results for input(s): LIPASE, AMYLASE in the last 168 hours. No results for input(s): AMMONIA in the last 168 hours. Coagulation Profile: Recent Labs  Lab 09/24/19 1651  INR 1.3*   Cardiac Enzymes: No results for input(s): CKTOTAL, CKMB, CKMBINDEX, TROPONINI in the last 168 hours. BNP (last 3 results) No results for input(s): PROBNP in the last 8760 hours. HbA1C: No results for input(s): HGBA1C in the last 72 hours. CBG: No results for input(s): GLUCAP in the last 168 hours. Lipid Profile: No results for input(s): CHOL, HDL, LDLCALC, TRIG, CHOLHDL, LDLDIRECT in the last 72 hours. Thyroid Function Tests: No results for input(s): TSH, T4TOTAL, FREET4, T3FREE, THYROIDAB in the last 72 hours. Anemia Panel: No results for input(s): VITAMINB12, FOLATE, FERRITIN,  TIBC, IRON, RETICCTPCT in the last 72 hours. Sepsis Labs: No results for input(s): PROCALCITON, LATICACIDVEN in the last 168 hours.  Recent Results (from the past 240 hour(s))  SARS CORONAVIRUS 2 (TAT 6-24 HRS) Nasopharyngeal Nasopharyngeal Swab     Status: None   Collection Time: 09/23/19  2:51 PM   Specimen: Nasopharyngeal Swab  Result Value Ref Range Status   SARS Coronavirus 2 NEGATIVE NEGATIVE Final    Comment: (NOTE) SARS-CoV-2 target nucleic acids are NOT DETECTED. The SARS-CoV-2 RNA is generally detectable in upper and lower respiratory specimens during the acute phase of infection. Negative results do not preclude SARS-CoV-2 infection, do not rule  out co-infections with other pathogens, and should not be used as the sole basis for treatment or other patient management decisions. Negative results must be combined with clinical observations, patient history, and epidemiological information. The expected result is Negative. Fact Sheet for Patients: SugarRoll.be Fact Sheet for Healthcare Providers: https://www.woods-mathews.com/ This test is not yet approved or cleared by the [place] FDA and  has been authorized for detection and/or diagnosis of SARS-CoV-2 by FDA under an Emergency Use Authorization (EUA). This EUA will remain  in effect (meaning this test can be used) for the duration of the COVID-19 declaration under Section 56 4(b)(1) of the Act, 21 U.S.C. section 360bbb-3(b)(1), unless the authorization is terminated or revoked sooner. Performed at [facility], [place] [address], [place], [place] [address]      Radiology Studies: CT ABDOMEN PELVIS W CONTRAST  Result Date: 09/26/2019 CLINICAL DATA:  Inpatient. New diagnosis of metastatic right upper lobe lung cancer. Staging evaluation. Additional history of hysterectomy for uterine cancer. EXAM: CT ABDOMEN AND PELVIS WITH CONTRAST TECHNIQUE: Multidetector CT imaging of the abdomen and pelvis was performed using the standard protocol following bolus administration of intravenous contrast. CONTRAST:  142mL OMNIPAQUE IOHEXOL 300 MG/ML  SOLN COMPARISON:  09/23/2019 chest CT angiogram. FINDINGS: Lower chest: Redemonstration of numerous solid pulmonary nodules scattered throughout both lung bases measuring up to 9 mm in the left lower lobe (series 4/image 26) and 11 mm in the medial right lower lobe (series 4/image 11), not appreciably changed since 09/23/2019 chest CT. Small dependent bilateral pleural effusions, left greater than right, decreased on the right and increased on the left since 09/23/2019 chest CT. Hepatobiliary: Three similar  hypodense scattered liver masses measuring 2.4 cm in segment 2 (series 2/image 21), 1.7 cm in segment 3 (series 2/image 30) and 1.6 cm in segment 8 (series 2/image 15). Normal gallbladder with no radiopaque cholelithiasis. No biliary ductal dilatation. Pancreas: Normal, with no mass or duct dilation. Spleen: Normal size. No mass. Adrenals/Urinary Tract: Hypodense 1.5 cm right adrenal nodule (series 2/image 26) with density 34 HU. No discrete left adrenal nodule. Simple 2.6 cm interpolar right renal cyst. Subcentimeter hypodense upper left renal cortical lesion, too small to characterize. No hydronephrosis. Normal bladder. Stomach/Bowel: Normal non-distended stomach. Normal caliber small bowel with no small bowel wall thickening. Normal appendix. Normal large bowel with no diverticulosis, large bowel wall thickening or pericolonic fat stranding. Vascular/Lymphatic: Atherosclerotic nonaneurysmal abdominal aorta. Patent portal, splenic, hepatic and renal veins. No pathologically enlarged lymph nodes in the abdomen or pelvis. Reproductive: Status post hysterectomy, with no abnormal findings at the vaginal cuff. No adnexal mass. Other: No pneumoperitoneum, ascites or focal fluid collection. Mild to moderate anasarca. Musculoskeletal: Widespread sclerotic osseous lesions throughout the lumbar spine (most prominent in the L3 vertebral body), left sacrum, bilateral iliac bones, posterior right acetabulum, right pubic  bone and left femoral neck. Moderate lumbar spondylosis. IMPRESSION: 1. Three similar scattered indeterminate hypodense liver masses, largest 2.4 cm in segment 2, suspicious for liver metastases. 2. Indeterminate 1.5 cm right adrenal nodule, suspicious for right adrenal metastasis. 3. Widespread sclerotic osseous metastases throughout lumbar spine, sacrum and bilateral pelvic girdle, including in the left femoral neck predisposing to pathologic fracture. 4. Redemonstration of widespread pulmonary metastases at  both lung bases. Small dependent left pleural effusion has increased. Small dependent right pleural effusion has decreased. 5.  Aortic Atherosclerosis (ICD10-I70.0). Electronically Signed   By: Ilona Sorrel M.D.   On: 09/26/2019 19:19   DG Chest Port 1 View  Result Date: 09/26/2019 CLINICAL DATA:  Status post right thoracentesis EXAM: PORTABLE CHEST 1 VIEW COMPARISON:  09/25/2019 FINDINGS: Cardiac shadow is stable. Aortic calcifications are again seen. Soft tissue density is noted in the right paratracheal region consistent with the patient's known history of lung carcinoma. Scattered nodular changes are noted within the left lung. Interval decrease in right-sided pleural effusion is noted. No pneumothorax is seen. IMPRESSION: No pneumothorax is noted. Changes consistent with the known history of lung carcinoma. Electronically Signed   By: Inez Catalina M.D.   On: 09/26/2019 17:03   US THORACENTESIS ASP PLEURAL SPACE W/IMG GUIDE  Result Date: 09/26/2019 INDICATION: Patient with history of metastatic lung cancer, right pleural effusion. Request made for diagnostic and therapeutic right thoracentesis. EXAM: ULTRASOUND GUIDED DIAGNOSTIC AND THERAPEUTIC RIGHT THORACENTESIS MEDICATIONS: None COMPLICATIONS: None immediate. PROCEDURE: An ultrasound guided thoracentesis was thoroughly discussed with the patient and questions answered. The benefits, risks, alternatives and complications were also discussed. The patient understands and wishes to proceed with the procedure. Written consent was obtained. Ultrasound was performed to localize and mark an adequate pocket of fluid in the right chest. The area was then prepped and draped in the normal sterile fashion. 1% Lidocaine was used for local anesthesia. Under ultrasound guidance a 6 Fr Safe-T-Centesis catheter was introduced. Thoracentesis was performed. The catheter was removed and a dressing applied. FINDINGS: A total of approximately 900 cc of slightly hazy, amber  fluid was removed. Samples were sent to the laboratory as requested by the clinical team. IMPRESSION: Successful ultrasound guided diagnostic and therapeutic right thoracentesis yielding 900 cc of pleural fluid. Read by: Rowe Robert, PA-C Electronically Signed   By: Aletta Edouard M.D.   On: 09/26/2019 16:38    Scheduled Meds: . sodium chloride flush  3 mL Intravenous Q12H   Continuous Infusions: . sodium chloride 75 mL/hr at 09/28/19 1000  . sodium chloride    . heparin 1,100 Units/hr (09/28/19 1000)     LOS: 5 days    Time spent: 25 mins.    Shawna Clamp, MD [facility]   If 7PM-7AM, please contact night-coverage

## 2019-09-29 ENCOUNTER — Ambulatory Visit
Admit: 2019-09-29 | Discharge: 2019-09-29 | Disposition: A | Discharge status: Home / Self Care | Payer: Medicare Other | Attending: Radiation Oncology | Admitting: Radiation Oncology

## 2019-09-29 ENCOUNTER — Inpatient Hospital Stay (HOSPITAL_COMMUNITY): Payer: Medicare Other

## 2019-09-29 ENCOUNTER — Encounter (HOSPITAL_COMMUNITY): Payer: Self-pay | Admitting: Internal Medicine

## 2019-09-29 DIAGNOSIS — I871 Compression of vein: Secondary | ICD-10-CM

## 2019-09-29 LAB — BASIC METABOLIC PANEL
Anion gap: 8 (ref 5–15)
BUN: 20 mg/dL (ref 8–23)
CO2: 22 mmol/L (ref 22–32)
Calcium: 8.9 mg/dL (ref 8.9–10.3)
Chloride: 102 mmol/L (ref 98–111)
Creatinine, Ser: 0.7 mg/dL (ref 0.44–1.00)
GFR calc Af Amer: >60 mL/min (ref >60)
GFR calc non Af Amer: >60 mL/min (ref >60)
Glucose, Bld: 113 mg/dL — ABNORMAL HIGH (ref 70–99)
Potassium: 5.2 mmol/L — ABNORMAL HIGH (ref 3.5–5.1)
Sodium: 132 mmol/L — ABNORMAL LOW (ref 135–145)

## 2019-09-29 LAB — MAGNESIUM: Magnesium: 1.8 mg/dL (ref 1.7–2.4)

## 2019-09-29 LAB — PHOSPHORUS: Phosphorus: 3 mg/dL (ref 2.5–4.6)

## 2019-09-29 LAB — BODY FLUID CELL COUNT WITH DIFFERENTIAL
Eos, Fluid: 0 %
Lymphs, Fluid: 29 %
Monocyte-Macrophage-Serous Fluid: 20 % — ABNORMAL LOW (ref 50–90)
Neutrophil Count, Fluid: 51 % — ABNORMAL HIGH (ref 0–25)
Total Nucleated Cell Count, Fluid: 1087 cu mm — ABNORMAL HIGH (ref 0–1000)

## 2019-09-29 LAB — ALBUMIN, PLEURAL OR PERITONEAL FLUID: Albumin, Fluid: 1.3 g/dL

## 2019-09-29 LAB — CBC
HCT: 31.4 % — ABNORMAL LOW (ref 36.0–46.0)
Hemoglobin: 9.6 g/dL — ABNORMAL LOW (ref 12.0–15.0)
MCH: 25.5 pg — ABNORMAL LOW (ref 26.0–34.0)
MCHC: 30.6 g/dL (ref 30.0–36.0)
MCV: 83.3 fL (ref 80.0–100.0)
Platelets: 212 10*3/uL (ref 150–400)
RBC: 3.77 MIL/uL — ABNORMAL LOW (ref 3.87–5.11)
RDW: 15.7 % — ABNORMAL HIGH (ref 11.5–15.5)
WBC: 8.4 10*3/uL (ref 4.0–10.5)
nRBC: 0 % (ref 0.0–0.2)

## 2019-09-29 LAB — GLUCOSE, PLEURAL OR PERITONEAL FLUID: Glucose, Fluid: 141 mg/dL

## 2019-09-29 LAB — HEPARIN LEVEL (UNFRACTIONATED): Heparin Unfractionated: 0.54 IU/mL (ref 0.30–0.70)

## 2019-09-29 LAB — PROTEIN, PLEURAL OR PERITONEAL FLUID: Total protein, fluid: <3 g/dL

## 2019-09-29 LAB — LACTATE DEHYDROGENASE, PLEURAL OR PERITONEAL FLUID: LD, Fluid: 120 U/L — ABNORMAL HIGH (ref 3–23)

## 2019-09-29 MED ORDER — HYDROCODONE-ACETAMINOPHEN 5-325 MG PO TABS
1.0000 | ORAL_TABLET | Freq: Four times a day (QID) | ORAL | Status: DC | PRN
  Administered 2019-10-01: 21:00:00 1 via ORAL
  Filled 2019-09-29 (×2): qty 1

## 2019-09-29 MED ORDER — LIDOCAINE HCL 1 % IJ SOLN
INTRAMUSCULAR | Status: AC
  Filled 2019-09-29: qty 10

## 2019-09-29 MED ORDER — GUAIFENESIN ER 600 MG PO TB12
600.0000 mg | ORAL_TABLET | Freq: Two times a day (BID) | ORAL | Status: DC
  Administered 2019-09-29 – 2019-10-10 (×23): 600 mg via ORAL
  Filled 2019-09-29 (×23): qty 1

## 2019-09-29 MED ORDER — SODIUM CHLORIDE (PF) 0.9 % IJ SOLN
INTRAMUSCULAR | Status: AC
  Filled 2019-09-29: qty 50

## 2019-09-29 MED ORDER — IOHEXOL 300 MG/ML  SOLN
75.0000 mL | Freq: Once | INTRAMUSCULAR | Status: AC | PRN
  Administered 2019-09-29: 75 mL via INTRAVENOUS

## 2019-09-29 MED ORDER — ADULT MULTIVITAMIN W/MINERALS CH
1.0000 | ORAL_TABLET | Freq: Every day | ORAL | Status: DC
  Administered 2019-09-29 – 2019-10-10 (×12): 1 via ORAL
  Filled 2019-09-29 (×12): qty 1

## 2019-09-29 MED ORDER — SENNOSIDES-DOCUSATE SODIUM 8.6-50 MG PO TABS
1.0000 | ORAL_TABLET | Freq: Two times a day (BID) | ORAL | Status: DC
  Administered 2019-09-29 – 2019-10-10 (×20): 1 via ORAL
  Filled 2019-09-29 (×21): qty 1

## 2019-09-29 MED ORDER — PRO-STAT SUGAR FREE PO LIQD
30.0000 mL | Freq: Two times a day (BID) | ORAL | Status: DC
  Administered 2019-09-30 – 2019-10-07 (×14): 30 mL via ORAL
  Filled 2019-09-29 (×13): qty 30

## 2019-09-29 MED ORDER — ENSURE ENLIVE PO LIQD
237.0000 mL | Freq: Two times a day (BID) | ORAL | Status: DC
  Administered 2019-09-29 – 2019-10-10 (×12): 237 mL via ORAL

## 2019-09-29 MED ORDER — ONDANSETRON HCL 4 MG/2ML IJ SOLN: 4.0000 mg | Freq: Four times a day (QID) | INTRAMUSCULAR | Status: DC | PRN

## 2019-09-29 MED ORDER — HYDROCERIN EX CREA
TOPICAL_CREAM | Freq: Two times a day (BID) | CUTANEOUS | Status: DC
  Administered 2019-09-30 – 2019-10-06 (×2): 1 via TOPICAL
  Filled 2019-09-29: qty 113

## 2019-09-29 NOTE — Progress Notes (Signed)
[facility] Therapy Treatment Record Phone [phone]   Radiation Therapy was administered to Emily Kim on: 09/29/2019  2:14 PM and was treatment # 3 out of a planned course of 10 treatments.  Radiation Treatment  1). Beam photons with 6-10 energy  2). Brachytherapy None  3). Stereotactic Radiosurgery None  4). Other Radiation None     Leighton Ruff, RTR

## 2019-09-29 NOTE — Consult Note (Signed)
NAME:  Emily Kim, MRN:  [id number], DOB:  05-15-1941, LOS: 6 ADMISSION DATE:  09/23/2019, CONSULTATION DATE: 09/29/2019 REFERRING MD: Berle Mull, CHIEF COMPLAINT: Metastatic lung cancer  Brief History   79 year old lady admitted with shortness of breath, hemoptysis Has had 50 pound weight loss over the last 3 months intermittent hemoptysis over 2 weeks Initial evaluation did reveal lung mass, pleural effusion Underwent bronchoscopy with biopsy-adenocarcinoma-disease is widely metastatic Oncology on board  Past Medical History   Past Medical History:  Diagnosis Date  . Uterine cancer ([place])      Significant Hospital Events   Had bronchoscopy with endobronchial ultrasound on 1/ 6  Consults:  Oncology Cardiothoracic surgery  Procedures:  Bronchoscopy 1/7  Significant Diagnostic Tests:  Surgical pathology 1/7-adenocarcinoma Cytology 1/7-non-small cell lung cancer CT scan of the chest significant for pulmonary emboli  Micro Data:  Pleural fluid culture 1/11  Antimicrobials:  Cefazolin 1/7  Interim history/subjective:  Shortness of breath Denies any chest pains or chest discomfort  Objective   Blood pressure (!) 160/101, pulse 90, temperature 98.4 F (36.9 C), temperature source Oral, resp. rate 18, height 5\' 7"  (1.702 m), weight 87.5 kg, SpO2 100 %.        Intake/Output Summary (Last 24 hours) at 09/29/2019 1847 Last data filed at 09/29/2019 1454 Gross per 24 hour  Intake 1358.46 ml  Output 675 ml  Net 683.46 ml   Filed Weights   09/25/19 0542 09/27/19 0949 09/29/19 1517  Weight: 84.1 kg 87.5 kg 87.5 kg    Examination: General: Elderly lady, does not appear to be in extremis HENT: Moist oral mucosa Lungs: Decreased breath sounds bilaterally Cardiovascular: S1-S2 appreciated Abdomen: Soft, bowel sounds appreciated Extremities: Upper extremity edema, finger clubbing Neuro: Alert and oriented x3 GU:   Resolved Hospital Problem list     Assessment &  Plan:  Metastatic adenocarcinoma of the lung Adenocarcinoma of the lung -Multiple metastatic deposits -Cause of large pleural effusion -Has evidence of lymphangitic spread of cancer -Multiple bone metastases  Bilateral pleural effusion -S/p right-sided thoracentesis -Likely trapped lung  Pulmonary embolism -Continue anticoagulation -Likely secondary to widely metastatic disease  Superior vena cava syndrome -Started palliative radiation treatment  Multiple metastatic deposits- Bone mets, liver mets, likely adrenal mets  CT evidence of lobar obstruction likely secondary to large tumor burden -Radiation therapy should help  Goals of care discussions Unfortunately, this is very advanced disease  May require repeat thoracentesis Placement of a Pleurx catheter for intermittent drainage of fluid for comfort may be considered- thi needs balanced with risk of infection- lung will likely not reexpand secondary to tumor burden  Labs   CBC: Recent Labs  Lab 09/25/19 0317 09/26/19 0434 09/27/19 0232 09/28/19 0536 09/29/19 0527  WBC 11.7* 13.2* 11.6* 9.4 8.4  HGB 10.9* 10.9* 9.9* 9.5* 9.6*  HCT 33.4* 35.6* 31.9* 30.0* 31.4*  MCV 78.8* 84.0 84.4 81.5 83.3  PLT 212 203 183 216 810    Basic Metabolic Panel: Recent Labs  Lab 09/24/19 1924 09/25/19 0317 09/27/19 0232 09/28/19 0955 09/28/19 1437 09/29/19 0527  NA 123* 126* 128* 131*  --  132*  K 4.5 4.5 4.8 5.2* 5.2* 5.2*  CL 91* 93* 99 102  --  102  CO2 17* 20* 20* 22  --  22  GLUCOSE 98 116* 157* 163*  --  113*  BUN 18 20 33* 27*  --  20  CREATININE 0.76 0.68 0.91 0.80  --  0.70  CALCIUM 9.1 9.2 9.1 8.9  --  8.9  MG  --   --  2.3 2.0  --  1.8  PHOS  --   --  2.8 2.4*  --  3.0   GFR: Estimated Creatinine Clearance: 65.9 mL/min (by C-G formula based on SCr of 0.7 mg/dL). Recent Labs  Lab 09/26/19 0434 09/27/19 0232 09/28/19 0536 09/29/19 0527  WBC 13.2* 11.6* 9.4 8.4    Liver Function Tests: Recent Labs    Lab 09/24/19 0500 09/27/19 0232 09/28/19 0955  AST 25 26 25   ALT 14 15 14   ALKPHOS 172* 146* 141*  BILITOT 0.6 0.7 0.7  PROT 7.1 6.4* 6.1*  ALBUMIN 3.1* 2.9* 2.5*   No results for input(s): LIPASE, AMYLASE in the last 168 hours. No results for input(s): AMMONIA in the last 168 hours.  ABG No results found for: PHART, PCO2ART, PO2ART, HCO3, TCO2, ACIDBASEDEF, O2SAT   Coagulation Profile: Recent Labs  Lab 09/24/19 1651  INR 1.3*    Cardiac Enzymes: No results for input(s): CKTOTAL, CKMB, CKMBINDEX, TROPONINI in the last 168 hours.  HbA1C: No results found for: HGBA1C  CBG: No results for input(s): GLUCAP in the last 168 hours.  Review of Systems:   Shortness of breath  Past Medical History  She,  has a past medical history of Uterine cancer ([place]).   Surgical History    Past Surgical History:  Procedure Laterality Date  . ABDOMINAL HYSTERECTOMY    . VIDEO BRONCHOSCOPY WITH ENDOBRONCHIAL ULTRASOUND N/A 09/25/2019   Procedure: VIDEO BRONCHOSCOPY WITH ENDOBRONCHIAL ULTRASOUND;  Surgeon: Melrose Nakayama, MD;  Location: [facility];  Service: Thoracic;  Laterality: N/A;     Social History   reports that she quit smoking about 30 years ago. She has never used smokeless tobacco. She reports previous alcohol use. She reports that she does not use drugs.   Family History   Her family history is negative for Cancer.   Allergies No Known Allergies   Sherrilyn Rist, MD [facility], PCCM Cell: [phone]

## 2019-09-29 NOTE — Progress Notes (Signed)
I called and let her son know an update on her brain mri and plans to continue xrt. Further discussion on additional xrt will be up to Dr. Julien Nordmann to advise [place].

## 2019-09-29 NOTE — Procedures (Signed)
PROCEDURE SUMMARY:  Successful image-guided right thoracentesis. Yielded 600 milliliters of clear, light amber fluid. Patient tolerated procedure well. EBL: Zero No immediate complications.  Specimen was sent for labs. Post procedure CXR shows no pneumothorax.  Please see imaging section of Epic for full dictation.  Joaquim Nam PA-C 09/29/2019 12:56 PM

## 2019-09-29 NOTE — Plan of Care (Signed)

## 2019-09-29 NOTE — Progress Notes (Signed)
ANTICOAGULATION CONSULT NOTE  Pharmacy Consult for heparin Indication: pulmonary embolus   Patient Measurements: Heparin Dosing Weight: 74 kg  Vital Signs: Temp: 98.4 F (36.9 C) (01/11 0500) Temp Source: Oral (01/11 0500) BP: 130/74 (01/11 0500) Pulse Rate: 90 (01/11 0500)  Labs: Recent Labs    09/27/19 0232 09/27/19 1031 09/28/19 0510 09/28/19 0536 09/28/19 0955 09/29/19 0517 09/29/19 0527  HGB 9.9*  --   --  9.5*  --   --  9.6*  HCT 31.9*  --   --  30.0*  --   --  31.4*  PLT 183  --   --  216  --   --  212  HEPARINUNFRC 0.33 0.56 0.44  --   --  0.54  --   CREATININE 0.91  --   --   --  0.80  --  0.70   Assessment: 80 y.o. female continues on IV heparin for PE.  Heparin to resume 09/26/19 at noon (24 hours post procedure)  Today, 09/29/19  Heparin level 0.54 this am on 1100 units/hr  CBC: Hgb 9.6 low but stable, Plts WNL  No reported bleeding or infusion issues  Goal of Therapy:  Heparin level 0.3-0.7 units/ml Monitor platelets by anticoagulation protocol: Yes   Plan:   Continue IV heparin at 1100 units/hr  Daily CBC and heparin level  Per Onc note 1/8: recommend Lovenox @ discharge, if Lovenox problematic use Xarelto  Netta Cedars PharmD, BCPS 09/29/2019,8:58 AM

## 2019-09-29 NOTE — NC FL2 (Signed)
[place] LEVEL OF CARE SCREENING TOOL     IDENTIFICATION  Patient Name: Emily Kim Birthdate: 25-Dec-1940 Sex: female Admission Date (Current Location): 09/23/2019  [facility] and [place] Number:  Herbalist and Address:  [facility],  [facility], [place]      Provider Number: [id number]  Attending Physician Name and Address:  Lavina Hamman, MD  Relative Name and Phone Number:  Mariadejesus Cade son [phone]    Current Level of Care: Hospital Recommended Level of Care: [place] Prior Approval Number:    Date Approved/Denied:   PASRR Number: [id number]  Discharge Plan: SNF    Current Diagnoses: Patient Active Problem List   Diagnosis Date Noted  . Adenocarcinoma of right lung, stage 4 ([place])   . Pulmonary embolism ([place]) 09/23/2019  . 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 Pleural effusion 09/23/2019    Orientation RESPIRATION BLADDER Height & Weight     Self, Time, Situation  Normal Continent Weight: 87.5 kg Height:  5\' 7"  (170.2 cm)  BEHAVIORAL SYMPTOMS/MOOD NEUROLOGICAL BOWEL NUTRITION STATUS      Continent Diet(Regular)  AMBULATORY STATUS COMMUNICATION OF NEEDS Skin   Limited Assist Verbally Normal                       Personal Care Assistance Level of Assistance              Functional Limitations Info  Sight, Hearing, Speech Sight Info: Impaired(eyeglasses) Hearing Info: Adequate Speech Info: Adequate    SPECIAL CARE FACTORS FREQUENCY  PT (By licensed PT), OT (By licensed OT)     PT Frequency: 5x week OT Frequency: 5x week            Contractures Contractures Info: Not present    Additional Factors Info  Code Status Code Status Info: Full code             Current Medications (09/29/2019):  This is the current hospital active medication list Current Facility-Administered Medications  Medication Dose Route Frequency Provider Last Rate Last Admin  . 0.9 %  sodium  chloride infusion  250 mL Intravenous PRN Melrose Nakayama, MD      . acetaminophen (TYLENOL) tablet 650 mg  650 mg Oral Q6H PRN Melrose Nakayama, MD       Or  . acetaminophen (TYLENOL) suppository 650 mg  650 mg Rectal Q6H PRN Melrose Nakayama, MD      . guaiFENesin (MUCINEX) 12 hr tablet 600 mg  600 mg Oral BID Lavina Hamman, MD      . guaiFENesin-dextromethorphan [facility] DM) 100-10 MG/5ML syrup 10 mL  10 mL Oral Q4H PRN Melrose Nakayama, MD   10 mL at 09/27/19 0114  . heparin ADULT infusion 100 units/mL (25000 units/22mL sodium chloride 0.45%)  1,100 Units/hr Intravenous Continuous Dana Allan I, MD 11 mL/hr at 09/28/19 1800 1,100 Units/hr at 09/28/19 1800  . hydrocerin (EUCERIN) cream   Topical BID Lavina Hamman, MD      . HYDROcodone-acetaminophen (NORCO/VICODIN) 5-325 MG per tablet 1 tablet  1 tablet Oral Q6H PRN Lavina Hamman, MD      . ipratropium-albuterol (DUONEB) 0.5-2.5 (3) MG/3ML nebulizer solution 3 mL  3 mL Nebulization Q4H PRN Melrose Nakayama, MD   3 mL at 09/24/19 2248  . lidocaine (XYLOCAINE) 1 % (with pres) injection           . ondansetron (ZOFRAN)  injection 4 mg  4 mg Intravenous Q6H PRN Lavina Hamman, MD      . senna-docusate (Senokot-S) tablet 1 tablet  1 tablet Oral BID Lavina Hamman, MD      . sodium chloride flush (NS) 0.9 % injection 3 mL  3 mL Intravenous Q12H Melrose Nakayama, MD   3 mL at 09/27/19 1000  . sodium chloride flush (NS) 0.9 % injection 3 mL  3 mL Intravenous PRN Melrose Nakayama, MD         Discharge Medications: Please see discharge summary for a list of discharge medications.  Relevant Imaging Results:  Relevant Lab Results:   Additional Information SS#[id number]  Enez Monahan, Juliann Pulse, [place]

## 2019-09-29 NOTE — Progress Notes (Signed)
Initial Nutrition Assessment  RD working remotely.   DOCUMENTATION CODES:   Not applicable  INTERVENTION:  - will order Ensure Enlive BID, each supplement provides 350 kcal and 20 grams of protein. - will order 30 mL Prostat BID, each supplement provides 100 kcal and 15 grams of protein. - will order daily multivitamin with minerals.  - will complete NFPE at follow-up.    NUTRITION DIAGNOSIS:   Increased nutrient needs related to acute illness, cancer and cancer related treatments as evidenced by estimated needs.  GOAL:   Patient will meet greater than or equal to 90% of their needs  MONITOR:   PO intake, Supplement acceptance, Labs, Weight trends  REASON FOR ASSESSMENT:   Malnutrition Screening Tool  ASSESSMENT:   79 year old female with medical history of uterine cancer s/p hysterectomy, hx of tobacco abuse (quit smoking 30 years ago), reported 50 lb weight loss in the past 3 months. She presented to the ED due to SOB and hemoptysis x2 weeks. CT chest on 1/5 showed acute segmental pulmonary emboli in L lobe thought to likely be metastatic lung cancer. She underwent bronch with endotracheal ultrasound for needle aspirations, brushings, and endobronchial biopsies. Preliminary biopsy concerning for NSCLC. She began palliative radiation on 1/7.  Patient is out of her room to radiation. Unable to obtain information from patient or complete NFPE at this time. Per flow sheet documentation, she recently consumed the following at meals:  1/8- 20% dinner 1/9- 0% dinner 1/10- 0% lunch, 100% dinner  Per chart review, weight on 1/7 was 185 lb and weight on 1/9 was 193 lb; no weight recorded after 1/9. No other weight information available from PTA. From H&P, patient had reported 50 lb weight loss in the past 3 months; will need to ask patient more about this at follow-up. She underwent thoracentesis this afternoon and 600 ml clear, light amber-colored fluid was removed at that time.    Per notes: - pulmonary embolism  - metastatic lung cancer with lymphangitic spread; bone mets; R pleural effusion thought to represent malignant effusion - severe SVC stenosis; severe distal R pulmonary artery stenosis - planned short course of palliative radiation  - MRI brain showed no mets - CT abd/pelvis showed mets in the liver, R adrenal, and spine - severe hyponatremia--improving   Labs reviewed; Na: 132 mmol/l, K: 5.2 mmol/l. Medications reviewed; 1 tablet senokot BID.      NUTRITION - FOCUSED PHYSICAL EXAM:  unable to complete at this time.   Diet Order:   Diet Order            Diet 2 gram sodium Room service appropriate? Yes; Fluid consistency: Thin  Diet effective now              EDUCATION NEEDS:   No education needs have been identified at this time  Skin:  Skin Assessment: Reviewed RN Assessment  Last BM:  1/8  Height:   Ht Readings from Last 1 Encounters:  09/23/19 5\' 7"  (1.702 m)    Weight:   Wt Readings from Last 1 Encounters:  09/27/19 87.5 kg    Ideal Body Weight:  61.4 kg  BMI:  Body mass index is 30.23 kg/m.  Estimated Nutritional Needs:   Kcal:  2200-2400 kcal  Protein:  110-125 grams  Fluid:  >/= 2.2 L/day     Jarome Matin, MS, RD, LDN, [facility] Inpatient Clinical Dietitian Pager # [phone] After hours/weekend pager # [phone]

## 2019-09-29 NOTE — Evaluation (Signed)
Physical Therapy Evaluation Patient Details Name: Emily Kim MRN: [id number] DOB: [birth year] Today's Date: 09/29/2019   History of Present Illness  79 year old African-American female with past medical history significant for uterine cancer s/p hysterectomy [place], [place]), apparently notes 50lbs of weight loss over the past 3 month and reformed tobacco user.  Apparently, patient smoked 1 pack of cigarettes daily for 15 years, but quit about 30 years ago.  Patient presented with shortness of breath and hemoptysis intermittently over the past 2 weeks.  CTA chest done on 09/22/2018 revealed acute segmental pulmonary emboli in the left lobe with likely metastatic lung cancer (CTA chest finding is documented below).  Patient is currently on heparin drip.  Patient underwent video bronchoscopy with endotracheal ultrasound for needle aspirations, brushings and endobronchial biopsies. Preliminary biopsy is said to be suggestive of non-small cell lung cancer, metastatic.  Patient was seen by the radiation oncology team and they started palliative radiotherapy yesterday for severe superior vena cava syndrome.  Clinical Impression  Pt admitted with above diagnosis. Pt with mild SOB upon sitting EOB and dizziness that resolved with time. Pt on RA with O2 sat 92%, but requested return of 1.5LPM O2 due to feeling SOB so returned and O2 sat 98%. Pt unable to stand or take steps due to weakness and requesting to hold for today. Pt currently with functional limitations due to the deficits listed below (see PT Problem List). Pt will benefit from skilled PT to increase their independence and safety with mobility to allow discharge to the venue listed below.       Follow Up Recommendations SNF    Equipment Recommendations  Rolling walker with 5" wheels;3in1 (PT)    Recommendations for Other Services OT consult     Precautions / Restrictions Precautions Precautions: Fall Precaution Comments: PE on heparin  drip Restrictions Weight Bearing Restrictions: No      Mobility  Bed Mobility Overal bed mobility: Needs Assistance Bed Mobility: Supine to Sit;Sit to Supine     Supine to sit: Min assist Sit to supine: Mod assist   General bed mobility comments: min assist to upright trunk and use of bedrail for supine to sit, mod assist to lift BLE into bed for sit to supine, mild SOB and on RA with O2 sat 92%  Transfers        General transfer comment: unable, requested not to stand due to "I feel so weak"  Ambulation/Gait        General Gait Details: unable, requested not to stand due to "I feel so weak"  Stairs            Wheelchair Mobility    Modified Rankin (Stroke Patients Only)       Balance Overall balance assessment: Needs assistance Sitting-balance support: Feet supported;Bilateral upper extremity supported Sitting balance-Leahy Scale: Fair Sitting balance - Comments: seated EOB for 3.5 minutes, mild SOB and on RA with O2 sat 92% but requested return of 1.5L O2, dizziness upon sitting that resolved                                     Pertinent Vitals/Pain Pain Assessment: No/denies pain    Home Living Family/patient expects to be discharged to:: Unsure(Pt reports son will be with her 24/7 when d/c home, then wants to get into ALF) Living Arrangements: Alone Available Help at Discharge: Family;Available 24 hours/day Type of Home: House Home Access: Stairs  to enter Entrance Stairs-Rails: None Entrance Stairs-Number of Steps: 1 Home Layout: One level Home Equipment: Cane - single point      Prior Function Level of Independence: Independent with assistive device(s)         Comments: Pt reports began using SPC about a month ago, ambulates community distances, drives, Ind with all ADLs. Pt denies home O2 use.     Hand Dominance   Dominant Hand: Right    Extremity/Trunk Assessment   Upper Extremity Assessment Upper Extremity  Assessment: Generalized weakness(BUE swelling limiting AROM and strength)    Lower Extremity Assessment Lower Extremity Assessment: Generalized weakness    Cervical / Trunk Assessment Cervical / Trunk Assessment: Normal  Communication   Communication: No difficulties  Cognition Arousal/Alertness: Awake/alert Behavior During Therapy: WFL for tasks assessed/performed Overall Cognitive Status: Within Functional Limits for tasks assessed                                        General Comments      Exercises     Assessment/Plan    PT Assessment Patient needs continued PT services  PT Problem List Decreased strength;Decreased range of motion;Decreased activity tolerance;Decreased balance;Decreased mobility;Cardiopulmonary status limiting activity       PT Treatment Interventions DME instruction;Gait training;Functional mobility training;Therapeutic activities;Therapeutic exercise;Balance training;Neuromuscular re-education;Patient/family education;Manual techniques;Modalities    PT Goals (Current goals can be found in the Care Plan section)  Acute Rehab PT Goals Patient Stated Goal: go to ALF PT Goal Formulation: With patient Time For Goal Achievement: 10/13/19 Potential to Achieve Goals: Good    Frequency Min 2X/week   Barriers to discharge        Co-evaluation               AM-PAC PT "6 Clicks" Mobility  Outcome Measure Help needed turning from your back to your side while in a flat bed without using bedrails?: A Little Help needed moving from lying on your back to sitting on the side of a flat bed without using bedrails?: A Lot Help needed moving to and from a bed to a chair (including a wheelchair)?: Total Help needed standing up from a chair using your arms (e.g., wheelchair or bedside chair)?: Total Help needed to walk in hospital room?: Total Help needed climbing 3-5 steps with a railing? : Total 6 Click Score: 9    End of Session  Equipment Utilized During Treatment: Oxygen(1.5LPM O2) Activity Tolerance: Patient limited by fatigue Patient left: in bed;with call bell/phone within reach;with bed alarm set Nurse Communication: Mobility status PT Visit Diagnosis: Other abnormalities of gait and mobility (R26.89);Muscle weakness (generalized) (M62.81)    Time: 0938-1000 PT Time Calculation (min) (ACUTE ONLY): 22 min   Charges:   PT Evaluation $PT Eval Moderate Complexity: 1 Mod           Tori Corrado Hymon PT, DPT 09/29/19, 11:54 AM [phone]

## 2019-09-29 NOTE — Progress Notes (Signed)
PROGRESS NOTE    Aurie Harroun  HKV:[id number] DOB: [birth year] DOA: 09/23/2019 PCP: Patient, No Pcp Per   Brief Narrative:  Patientis a 79 year old African-American female with past medical history significant for uterine cancer s/p hysterectomy [place], [place]), apparently notes 50lbs of weight loss over the past 3 month and reformed tobacco user.  Apparently, patient smoked 1 pack of cigarettes daily for 15 years, but quit about 30 years ago.  Patient presented with shortness of breath and hemoptysis intermittently over the past 2 weeks.  CTA chest done on 09/22/2018 revealed acute segmental pulmonary emboli in the left lobe with likely metastatic lung cancer (CTA chest finding is documented below).  Patient is currently on heparin drip.  Patient underwent video bronchoscopy with endotracheal ultrasound for needle aspirations, brushings and endobronchial biopsies. Preliminary biopsy is said to be suggestive of non-small cell lung cancer, metastatic.  Patient was seen by the radiation oncology team and they started palliative radiotherapy yesterday for severe superior vena cava syndrome.  Patient being transferred from [facility].   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  Assessment & Plan:   Principal Problem:   Pulmonary embolism (HCC) Active Problems:   SVC syndrome   Pleural effusion   Adenocarcinoma of right lung, stage 4 (HCC)  Pulmonary emboli: -Patient has been on heparin gtt.  -Resumed heparin   -Pulmonary emboli is likely  related to metastatic lung cancer. - She can switch to subcutaneous Lovenox after discharge and if this cannot be done for any logistic reason, the alternative will be treatment with Xarelto  Metastatic lung cancer with lymphangitic spread, bone metastasis-loculated right pleural effusion likely represents a malignant effusion / Weight loss 50 pound weight loss -Surgery consulted-recommending bronchoscopy and endobronchial ultrasound on 1/7 after holding heparin for 6 hours 09/25/2019: Kindly see above.  Patient underwent diagnostic procedure by the cardiothoracic team.  Preliminary diagnosis is suggestive of metastatic non small cell lung cancer.  SVC syndrome as previously documented.  suggest palliative radiation therapy daily. Onco: Recommends staging work-up ( MRI of the brain as well as CT scan of the abdomen and pelvis to rule out any other metastatic disease). SVC syndrome: Continue with short course of palliative radiotherapy by radiation oncology. Moderate loculated right pleural effusion, underwent ultrasound-guided right thoracentesis , 900 cc fluid drained, sent for cytology. MRI, brain: No mets, potential lesion in the clivus cannot be excluded. CT abdomen and pelvis: Mets in the liver, right adrenal and spine.  On 09/29/2019 patient reported increasing shortness of breath.  Chest x-ray was showing complete opacification of the right lung.  Ultrasound-guided thoracentesis was performed 600 cc removed.  No blood. CT chest with contrast after thoracentesis continues to show complete obstruction of the right upper lobe bronchus and collapse and consolidation of the right upper lobe as well as right middle lobe occlusion with atelectasis. Pulmonary consult for further assistance.  Appreciate help  Severe hyponatremia: Improving  -Serum osmolality is 259 -Urine sodium and urine osmolality ordered yesterday but have not yet been sent- patient  urinated about 1 hr ago into a bedpan 09/27/2019:  Sodium  today is 128.  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  Severe SVC stenosis with collaterals - swelling of face noted. 09/25/2019: 4 palliative radiation therapy daily started.  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   DVT prophylaxis: Therapeutic anticoagulation with heparin which I will continue for now given patient's requirement for multiple procedures  Code Status: Full code.  Palliative care consulted Family Communication:  None  At bed side. Disposition Plan: will be decided.  Consultants:   CT surgery  Hematology  Procedures:  Antimicrobials:  Anti-infectives (From admission, onward)   None     Subjective: Reports shortness of breath.  No nausea no vomiting.  No fever or chills.  No chest pain.  Has bilateral upper extremity swelling.  Objective: Vitals:   09/29/19 1236 09/29/19 1239 09/29/19 1248 09/29/19 1517  BP: (!) 163/90 (!) 175/109 (!) 160/101   Pulse:      Resp:      Temp:      TempSrc:      SpO2:      Weight:    87.5 kg  Height:    5\' 7"  (1.702 m)    Intake/Output Summary (Last 24 hours) at 09/29/2019 1654 Last data filed at 09/29/2019 1454 Gross per 24 hour  Intake 1941.27 ml  Output 795 ml  Net 1146.27 ml   Filed Weights   09/25/19 0542 09/27/19 0949 09/29/19 1517  Weight: 84.1 kg 87.5 kg 87.5 kg    Examination: General: alert and oriented to time, place, and person. Appear in moderate distress, affect appropriate Eyes: PERRL, Conjunctiva normal ENT: Oral Mucosa Clear, moist  Neck: difficult to assess JVD, no Abnormal Mass Or lumps Cardiovascular: S1 and S2 Present, no Murmur, peripheral pulses symmetrical Respiratory: increased respiratory effort, Bilateral Air entry equal and Decreased, no signs of accessory muscle use, bilateral  Crackles, Occasional  wheezes Abdomen: Bowel Sound present, Soft and no tenderness, no hernia Skin: no  rashes  Extremities: bilateral  Pedal edema, no calf tenderness Neurologic: without any new focal findings  Gait not checked due to patient safety concerns    Data Reviewed: I have personally reviewed following labs and imaging studies  CBC: Recent Labs  Lab 09/25/19 0317 09/26/19 0434 09/27/19 0232 09/28/19 0536 09/29/19 0527  WBC 11.7* 13.2* 11.6* 9.4 8.4  HGB 10.9* 10.9* 9.9* 9.5* 9.6*  HCT 33.4* 35.6* 31.9* 30.0* 31.4*  MCV 78.8* 84.0 84.4 81.5 83.3  PLT 212 203 183 216 573   Basic Metabolic Panel: Recent Labs  Lab 09/24/19 1924 09/25/19 0317 09/27/19 0232 09/28/19 0955 09/28/19 1437 09/29/19 0527  NA 123* 126* 128* 131*  --  132*  K 4.5 4.5 4.8 5.2* 5.2* 5.2*  CL 91* 93* 99 102  --  102  CO2 17* 20* 20* 22  --  22  GLUCOSE 98 116* 157* 163*  --  113*  BUN 18 20 33* 27*  --  20  CREATININE 0.76 0.68 0.91 0.80  --  0.70  CALCIUM 9.1 9.2 9.1 8.9  --  8.9  MG  --   --  2.3 2.0  --  1.8  PHOS  --   --  2.8 2.4*  --  3.0   GFR: Estimated Creatinine Clearance: 65.9 mL/min (by C-G formula based on SCr of 0.7 mg/dL). Liver Function Tests: Recent Labs  Lab 09/24/19 0500 09/27/19 0232 09/28/19 0955  AST 25 26 25   ALT  14 15 14   ALKPHOS 172* 146* 141*  BILITOT 0.6 0.7 0.7  PROT 7.1 6.4* 6.1*  ALBUMIN 3.1* 2.9* 2.5*   No results for input(s): LIPASE, AMYLASE in the last 168 hours. No results for input(s): AMMONIA in the last 168 hours. Coagulation Profile: Recent Labs  Lab 09/24/19 1651  INR 1.3*   Cardiac Enzymes: No results for input(s): CKTOTAL, CKMB, CKMBINDEX, TROPONINI in the last 168 hours. BNP (last 3 results) No results for input(s): PROBNP in the last 8760 hours. HbA1C: No results for input(s): HGBA1C in the last 72 hours. CBG: No results for input(s): GLUCAP in the last 168 hours. Lipid Profile: No results for input(s): CHOL, HDL, LDLCALC, TRIG, CHOLHDL, LDLDIRECT in the last 72 hours. Thyroid Function Tests: No results for input(s): TSH,  T4TOTAL, FREET4, T3FREE, THYROIDAB in the last 72 hours. Anemia Panel: No results for input(s): VITAMINB12, FOLATE, FERRITIN, TIBC, IRON, RETICCTPCT in the last 72 hours. Sepsis Labs: No results for input(s): PROCALCITON, LATICACIDVEN in the last 168 hours.  Recent Results (from the past 240 hour(s))  SARS CORONAVIRUS 2 (TAT 6-24 HRS) Nasopharyngeal Nasopharyngeal Swab     Status: None   Collection Time: 09/23/19  2:51 PM   Specimen: Nasopharyngeal Swab  Result Value Ref Range Status   SARS Coronavirus 2 NEGATIVE NEGATIVE Final    Comment: (NOTE) SARS-CoV-2 target nucleic acids are NOT DETECTED. The SARS-CoV-2 RNA is generally detectable in upper and lower respiratory specimens during the acute phase of infection. Negative results do not preclude SARS-CoV-2 infection, do not rule out co-infections with other pathogens, and should not be used as the sole basis for treatment or other patient management decisions. Negative results must be combined with clinical observations, patient history, and epidemiological information. The expected result is Negative. Fact Sheet for Patients: SugarRoll.be Fact Sheet for Healthcare Providers: https://www.woods-mathews.com/ This test is not yet approved or cleared by the [place] FDA and  has been authorized for detection and/or diagnosis of SARS-CoV-2 by FDA under an Emergency Use Authorization (EUA). This EUA will remain  in effect (meaning this test can be used) for the duration of the COVID-19 declaration under Section 56 4(b)(1) of the Act, 21 U.S.C. section 360bbb-3(b)(1), unless the authorization is terminated or revoked sooner. Performed at [facility], [place] [address] [place], [place] [address]      Radiology Studies: CT CHEST W CONTRAST  Result Date: 09/29/2019 CLINICAL DATA:  Pneumonia, effusion or abscess suspected. EXAM: CT CHEST WITH CONTRAST TECHNIQUE: Multidetector CT imaging  of the chest was performed during intravenous contrast administration. CONTRAST:  47mL OMNIPAQUE IOHEXOL 300 MG/ML  SOLN COMPARISON:  09/23/2019. FINDINGS: Cardiovascular: Extensive collateral venous flow within the mediastinum and left chest. Occlusion of the SVC. Right pulmonary artery is encased and markedly narrowed. Atherosclerotic calcification of the aorta and aortic valve. Heart size normal. No pericardial effusion. Mediastinum/Nodes: Image quality is degraded by extensive streak artifact patient's arms and collateral venous flow. Ill-defined soft tissue is seen in the subcarinal region, extending along the right hilum with narrowing and encasement of the right middle and right lower lobe bronchi. Lungs/Pleura: Right upper lobe bronchus is now completely obstructed with collapse/consolidation in the right upper lobe, obscuring the previously seen medial right upper lobe mass. Right middle lobe obstruction is again seen with collapse of the right middle lobe. Large right pleural effusion, slightly decreased from 09/23/2019 status post thoracentesis earlier today. Associated collapse/consolidation in the right lower lobe. Small to moderate simple appearing left pleural  effusion with compressive atelectasis in the left lower lobe. Multiple hematogenously distributed shaggy bilateral pulmonary nodules, as on 09/23/2019. Upper Abdomen: Altered perfusion within the left hepatic lobe is related to SVC occlusion and extensive collateral venous flow. 1.4 cm low-attenuation lesion in the dome of the liver is difficult to characterize due to size. Visualized portions of the liver and gallbladder are otherwise unremarkable. Thickening of the right adrenal gland. Left adrenal gland is grossly unremarkable. Low-attenuation lesions in the kidneys measure up to 2.4 cm on the right and are likely cysts. 6 mm hyperattenuating lesion in the upper pole right kidney is too small to characterize. Visualized portions of the spleen  and stomach are grossly unremarkable. Musculoskeletal: Degenerative changes in the spine. There are patchy areas of sclerosis throughout spine, as on the prior exam. IMPRESSION: 1. Right upper lobe/right perihilar mass with interval complete obstruction of the right upper lobe bronchus and resultant collapse/consolidation of the right upper lobe. Persistent right middle lobe occlusion with associated atelectasis. Multiple bilateral hematogenously distributed pulmonary nodules and osseous metastases. Findings are most indicative of stage IV lung cancer. 2. Associated marked stenosis/occlusion of the superior vena cava and marked narrowing of the right pulmonary artery. 3. Large right pleural effusion, slightly decreased from 09/23/2019 after thoracentesis earlier today. No pneumothorax. Associated collapse/consolidation in the right lower lobe. 4. Small to moderate simple left pleural effusion. 5. Lesion in the hepatic dome, better seen on CT abdomen 09/26/2019. 6. Right adrenal thickening, described as a nodule on 09/26/2019. Please refer to that study for further details and discussion. Electronically Signed   By: Lorin Picket M.D.   On: 09/29/2019 16:15   DG Chest Port 1 View  Result Date: 09/29/2019 CLINICAL DATA:  Right pleural effusion. Status post thoracentesis. Metastatic lung cancer. Pulmonary emboli. EXAM: PORTABLE CHEST 1 VIEW 1:01 p.m. COMPARISON:  09/29/2019 at 11:01 a.m. and CT scan of the chest dated 09/23/2019 FINDINGS: There has been a significant decrease in the large right pleural effusion with re-aeration of portions of the right lung after thoracentesis. No pneumothorax. Moderate residual right pleural effusion. Multiple small metastatic nodules are noted throughout the left lung. Much more apparent on the CT scan of 09/23/2019. Heart size and pulmonary vascularity are normal. Aortic atherosclerosis. IMPRESSION: 1. No pneumothorax after thoracentesis. 2. Decrease in the large right pleural  effusion. 3. Residual moderate right pleural effusion. 4. Multiple metastatic nodules throughout the left lung. 5.  Aortic Atherosclerosis (ICD10-I70.0). Electronically Signed   By: Lorriane Shire M.D.   On: 09/29/2019 13:16   DG CHEST PORT 1 VIEW  Result Date: 09/29/2019 CLINICAL DATA:  Shortness of breath, uterine cancer, adenocarcinoma RIGHT lung stage IV EXAM: PORTABLE CHEST 1 VIEW COMPARISON:  Portable exam 1101 hours compared to 09/26/2019 FINDINGS: Normal heart size and mediastinal contours. Atherosclerotic calcification aorta. Complete opacification of the RIGHT hemithorax new since the previous exam likely representing a combination of pleural effusion and atelectasis. Possibility of mucous plugging raised due to the rapid interval change since 09/26/2019. Mild atelectasis and tiny effusion at LEFT base. Questionable nodular density 8 mm diameter lower LEFT lung. No pneumothorax or acute osseous findings. IMPRESSION: Complete opacification of RIGHT hemithorax new since 09/26/2019 likely a combination of pleural effusion and atelectasis question mucous plugging. Atelectasis and tiny effusion at LEFT lung base with questionable 8 mm lung nodule lower LEFT lung. Electronically Signed   By: Lavonia Dana M.D.   On: 09/29/2019 11:20   US THORACENTESIS ASP PLEURAL SPACE W/IMG GUIDE  Result Date: 09/29/2019 INDICATION: Patient with history of metastatic lung cancer, recurrent right pleural effusion status post thoracentesis 09/26/2019 yielding 900 mL pleural fluid. New complete opacification right hemithorax seen on chest x-ray today. Request to IR for repeat diagnostic and therapeutic right-sided thoracentesis. EXAM: ULTRASOUND GUIDED RIGHT THORACENTESIS MEDICATIONS: 10 mL 1% lidocaine COMPLICATIONS: None immediate. PROCEDURE: An ultrasound guided thoracentesis was thoroughly discussed with the patient and questions answered. The benefits, risks, alternatives and complications were also discussed. The  patient understands and wishes to proceed with the procedure. Written consent was obtained. Ultrasound was performed to localize and mark an adequate pocket of fluid in the right chest. The area was then prepped and draped in the normal sterile fashion. 1% Lidocaine was used for local anesthesia. Under ultrasound guidance a 6 Fr Safe-T-Centesis catheter was introduced. Thoracentesis was performed. The catheter was removed and a dressing applied. FINDINGS: A total of approximately 600 mL of clear, light amber fluid was removed. Samples were sent to the laboratory as requested by the clinical team. IMPRESSION: Successful ultrasound guided right thoracentesis yielding 600 mL of pleural fluid. Read by Candiss Norse, PA-C Electronically Signed   By: Jacqulynn Cadet M.D.   On: 09/29/2019 13:20    Scheduled Meds:  feeding supplement (ENSURE ENLIVE)  237 mL Oral BID BM   feeding supplement (PRO-STAT SUGAR FREE 64)  30 mL Oral BID   guaiFENesin  600 mg Oral BID   hydrocerin   Topical BID   multivitamin with minerals  1 tablet Oral Daily   senna-docusate  1 tablet Oral BID   sodium chloride flush  3 mL Intravenous Q12H   Continuous Infusions:  sodium chloride     heparin 1,100 Units/hr (09/29/19 1326)     LOS: 6 days    Time spent: 35 mins.    Berle Mull, MD [facility]   If 7PM-7AM, please contact night-coverage

## 2019-09-30 ENCOUNTER — Ambulatory Visit
Admit: 2019-09-30 | Discharge: 2019-09-30 | Disposition: A | Discharge status: Home / Self Care | Payer: Medicare Other | Attending: Radiation Oncology | Admitting: Radiation Oncology

## 2019-09-30 ENCOUNTER — Inpatient Hospital Stay (HOSPITAL_COMMUNITY): Payer: Medicare Other

## 2019-09-30 DIAGNOSIS — Z7189 Other specified counseling: Secondary | ICD-10-CM

## 2019-09-30 DIAGNOSIS — I2693 Single subsegmental pulmonary embolism without acute cor pulmonale: Secondary | ICD-10-CM

## 2019-09-30 DIAGNOSIS — Z515 Encounter for palliative care: Secondary | ICD-10-CM

## 2019-09-30 DIAGNOSIS — Z789 Other specified health status: Secondary | ICD-10-CM

## 2019-09-30 LAB — CBC
HCT: 34.5 % — ABNORMAL LOW (ref 36.0–46.0)
Hemoglobin: 10.4 g/dL — ABNORMAL LOW (ref 12.0–15.0)
MCH: 25.5 pg — ABNORMAL LOW (ref 26.0–34.0)
MCHC: 30.1 g/dL (ref 30.0–36.0)
MCV: 84.6 fL (ref 80.0–100.0)
Platelets: 238 10*3/uL (ref 150–400)
RBC: 4.08 MIL/uL (ref 3.87–5.11)
RDW: 16.1 % — ABNORMAL HIGH (ref 11.5–15.5)
WBC: 10.1 10*3/uL (ref 4.0–10.5)
nRBC: 0 % (ref 0.0–0.2)

## 2019-09-30 LAB — COMPREHENSIVE METABOLIC PANEL
ALT: 15 U/L (ref 0–44)
AST: 22 U/L (ref 15–41)
Albumin: 2.2 g/dL — ABNORMAL LOW (ref 3.5–5.0)
Alkaline Phosphatase: 123 U/L (ref 38–126)
Anion gap: 9 (ref 5–15)
BUN: 17 mg/dL (ref 8–23)
CO2: 22 mmol/L (ref 22–32)
Calcium: 8.7 mg/dL — ABNORMAL LOW (ref 8.9–10.3)
Chloride: 100 mmol/L (ref 98–111)
Creatinine, Ser: 0.64 mg/dL (ref 0.44–1.00)
GFR calc Af Amer: >60 mL/min (ref >60)
GFR calc non Af Amer: >60 mL/min (ref >60)
Glucose, Bld: 123 mg/dL — ABNORMAL HIGH (ref 70–99)
Potassium: 4.6 mmol/L (ref 3.5–5.1)
Sodium: 131 mmol/L — ABNORMAL LOW (ref 135–145)
Total Bilirubin: 0.7 mg/dL (ref 0.3–1.2)
Total Protein: 5 g/dL — ABNORMAL LOW (ref 6.5–8.1)

## 2019-09-30 LAB — CYTOLOGY - NON PAP

## 2019-09-30 LAB — MRSA PCR SCREENING: MRSA by PCR: NEGATIVE

## 2019-09-30 LAB — MAGNESIUM: Magnesium: 1.8 mg/dL (ref 1.7–2.4)

## 2019-09-30 LAB — LACTIC ACID, PLASMA: Lactic Acid, Venous: 1.4 mmol/L (ref 0.5–1.9)

## 2019-09-30 LAB — HEPARIN LEVEL (UNFRACTIONATED): Heparin Unfractionated: 0.46 IU/mL (ref 0.30–0.70)

## 2019-09-30 MED ORDER — HEPARIN (PORCINE) 25000 UT/250ML-% IV SOLN
1100.0000 [IU]/h | INTRAVENOUS | Status: AC
  Administered 2019-10-01: 1100 [IU]/h via INTRAVENOUS

## 2019-09-30 MED ORDER — SODIUM CHLORIDE 0.9% FLUSH
10.0000 mL | Freq: Two times a day (BID) | INTRAVENOUS | Status: DC
  Administered 2019-10-01 – 2019-10-06 (×6): 10 mL
  Administered 2019-10-06: 20 mL

## 2019-09-30 MED ORDER — ORAL CARE MOUTH RINSE
15.0000 mL | Freq: Two times a day (BID) | OROMUCOSAL | Status: DC
  Administered 2019-09-30 – 2019-10-05 (×7): 15 mL via OROMUCOSAL

## 2019-09-30 MED ORDER — SODIUM CHLORIDE 0.9% FLUSH
10.0000 mL | INTRAVENOUS | Status: DC | PRN
  Administered 2019-10-01: 10 mL

## 2019-09-30 MED ORDER — CHLORHEXIDINE GLUCONATE CLOTH 2 % EX PADS
6.0000 | MEDICATED_PAD | Freq: Every day | CUTANEOUS | Status: DC
  Administered 2019-10-01 – 2019-10-10 (×10): 6 via TOPICAL

## 2019-09-30 NOTE — Procedures (Deleted)
Central Venous Catheter Insertion Procedure Note Ashlie Mcmenamy [id number] 04-18-1941  Procedure: Insertion of Central Venous Catheter Indications: Assessment of intravascular volume, Drug and/or fluid administration and Frequent blood sampling  Procedure Details Consent: Risks of procedure as well as the alternatives and risks of each were explained to the (patient/caregiver).  Consent for procedure obtained. Time Out: Verified patient identification, verified procedure, site/side was marked, verified correct patient position, special equipment/implants available, medications/allergies/relevent history reviewed, required imaging and test results available.  Performed Real time [place] used to ID and cannulate vessel  Maximum sterile technique was used including antiseptics, cap, gloves, gown, hand hygiene, mask and sheet. Skin prep: Chlorhexidine; local anesthetic administered A antimicrobial bonded/coated triple lumen catheter was placed in the left subclavian vein using the Seldinger technique.  Evaluation Blood flow good Complications: No apparent complications Patient did tolerate procedure well. Chest X-ray ordered to verify placement.  CXR: pending.  Clementeen Graham 09/30/2019, 11:42 AM  Erick Colace ACNP-BC [place] Pager # [phone] OR # [phone] if no answer

## 2019-09-30 NOTE — Progress Notes (Signed)
   NAME:  Emily Kim, MRN:  [id number], DOB:  Mar 22, 1941, LOS: 7 ADMISSION DATE:  09/23/2019, CONSULTATION DATE: 09/29/2019 REFERRING MD: Berle Mull, CHIEF COMPLAINT: Metastatic lung cancer  Brief History   79 year old lady admitted with shortness of breath, hemoptysis Has had 50 pound weight loss over the last 3 months intermittent hemoptysis over 2 weeks Initial evaluation did reveal lung mass, pleural effusion Underwent bronchoscopy with biopsy-adenocarcinoma-disease is widely metastatic Oncology on board  Past Medical History   Past Medical History:  Diagnosis Date  . Uterine cancer ([place])      Significant Hospital Events   Had bronchoscopy with endobronchial ultrasound on 1/ 6  Consults:  Oncology Cardiothoracic surgery  Procedures:  Bronchoscopy 1/7  Significant Diagnostic Tests:  Surgical pathology 1/7-adenocarcinoma Cytology 1/7-non-small cell lung cancer CT scan of the chest significant for pulmonary emboli  Micro Data:  Pleural fluid culture 1/11  Antimicrobials:  Cefazolin 1/7  Interim history/subjective:  Still sob   Objective   Blood pressure (Abnormal) 147/103, pulse 98, temperature 97.7 F (36.5 C), temperature source Oral, resp. rate 18, height 5\' 7"  (1.702 m), weight 86.5 kg, SpO2 97 %.        Intake/Output Summary (Last 24 hours) at 09/30/2019 0958 Last data filed at 09/30/2019 0600 Gross per 24 hour  Intake 982.99 ml  Output 500 ml  Net 482.99 ml   Filed Weights   09/27/19 0949 09/29/19 1517 09/30/19 0630  Weight: 87.5 kg 87.5 kg 86.5 kg    Examination: General this is a 79 year old female resting in bed. Not in distress HENT MMM sclera not icteric. + JVD Pulm decreased bases and more decreased on right than left. No current respiratory distress or accessory use  Card RRR abd not tender Ext bilateral UE edema pulses palp   Neuro intact  Resolved Hospital Problem list     Assessment & Plan:  Metastatic adenocarcinoma of the  lung w/ post-obstructive atelectasis, Multiple metastatic deposits, malignant pleural effusion, evidence of lymphangitic spread of cancer & Multiple bone metastases also possible adrenals  Plan Cont palliative XRT IS  Oxygen as indicated  Consider pleurx if effusion re-occurs (sounds like she did get symptomatic relief) Needs palliative consult  No role for bronch  Pulmonary embolism -Likely secondary to widely metastatic disease Plan Cont systemic ac  Superior vena cava syndrome Plan Cont palliative radiation     Erick Colace ACNP-BC [place] Pager # [phone] OR # [phone] if no answer

## 2019-09-30 NOTE — Progress Notes (Signed)
ANTICOAGULATION CONSULT NOTE  Pharmacy Consult for heparin Indication: pulmonary embolus   Patient Measurements: Heparin Dosing Weight: 74 kg  Vital Signs: Temp: 97.7 F (36.5 C) (01/12 0630) Temp Source: Oral (01/12 0630) BP: 147/103 (01/12 0630) Pulse Rate: 98 (01/12 0630)  Labs: Recent Labs    09/28/19 0510 09/28/19 0536 09/28/19 0955 09/29/19 0517 09/29/19 0527 09/30/19 0500  HGB  --  9.5*  --   --  9.6* 10.4*  HCT  --  30.0*  --   --  31.4* 34.5*  PLT  --  216  --   --  212 238  HEPARINUNFRC 0.44  --   --  0.54  --  0.46  CREATININE  --   --  0.80  --  0.70 0.64   Assessment: 79 y.o. female continues on IV heparin for PE.  Heparin to resume 09/26/19 at noon (24 hours post procedure)  Today, 09/30/19  Heparin level 0.46 this am on 1100 units/hr  CBC: Hgb 10.4 low but stable, Plts WNL  No reported bleeding or infusion issues  Goal of Therapy:  Heparin level 0.3-0.7 units/ml Monitor platelets by anticoagulation protocol: Yes   Plan:   Continue IV heparin at 1100 units/hr  Daily CBC and heparin level  Per Onc note 1/8: recommend Lovenox @ discharge, if Lovenox problematic use Xarelto  Netta Cedars PharmD, BCPS 09/30/2019,7:16 AM

## 2019-09-30 NOTE — Progress Notes (Addendum)
pcxr reviewed. Once again has complete opacification of the right hemithorax. Likely represents re-accumulation of fluid.  Would await cytology & plan on having Dr Tamala Julian look her for possible pleurx   Erick Colace ACNP-BC [place] Pager # [phone] OR # [phone] if no answer

## 2019-09-30 NOTE — Progress Notes (Signed)
PROGRESS NOTE    Emily Kim  AST:[id number] DOB: 1941/08/12 DOA: 09/23/2019 PCP: Patient, No Pcp Per   Brief Narrative:  Patient is a 79 year old female with history of uterine cancer status post hysterectomy who presented with shortness of breath, hemoptysis for 2 weeks, weight loss.  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 started on heparin drip.  Underwent Video bronchoscopy with endotracheal ultrasound for needle aspiration, brushing, endobronchial biopsy.  Preliminary showed non-small cell lung cancer, metastatic.  Being followed by PCCM, radiation oncology, oncology.  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  Also has opacification of the right hemithorax from reaccumulation of pleural effusion.  PCCM planning for Pleurx.  Assessment & Plan:   Principal Problem:   Pulmonary embolism (HCC) Active Problems:   SVC syndrome   Pleural effusion   Adenocarcinoma of right lung, stage 4 (HCC)   Difficult intravenous access   Pulmonary emboli: Presents with dyspnea.  Found to be hypoxic on presentation.  Currently in 3 L of oxygen per minute.  Currently on heparin drip.  Pulmonary emboli is likely due to metastatic lung cancer.  Metastatic lung cancer with lymphangitic spread, bone metastasis, malignant effusion of the right pleura: Presented with weight loss, shortness of breath.  Underwent diagnostic procedure by cardiothoracic team.  Biopsy showed metastatic non-small cell lung cancr e( adenocarcinoma).  Oncology, radiation oncology following. CT chest on 1/11/ showed obstruction of the right upper lobe bronchus and resultant collapse/consolidation of the right upper lobe. Persistent right middle lobe occlusion with associated atelectasis. Multiple bilateral hematogenously distributed  pulmonary nodules and osseous metastases. CT abd/pelvis showed three  scattered indeterminate hypodense liver masses,largest 2.4 cm in segment 2, suspicious for liver metastases. ndeterminate 1.5 cm right adrenal nodule, suspicious for right adrenal metastasis.Widespread sclerotic osseous metastases throughout lumbar spine,sacrum and bilateral pelvic girdle, including in the left femoral neck predisposing to pathologic fracture.  Recurrent right pleural effusion: Underwent thoracentesis.  Chest x-ray done on 09/30/19  showed reaccumulation with complete opacification of right thorax.  PCCM following and planning for Pleurx.  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  Undergoing palliative radiation therapy.  Imaging showed marked stenosis/occlusion of the superior vena cava and marked narrowing of the right pulmonary artery  Hyponatremia: Stable.  Continue to monitor.  Goals of care: Palliative care consulted.  Patient might be a candidate for hospice.  PT/OT recommended skilled nursing facility.       Nutrition Problem: Increased nutrient needs Etiology: acute illness, cancer and cancer related treatments      DVT prophylaxis: IV heparin Code Status: Full code Family Communication: None present at the bedside Disposition Plan: Undetermined at this point.   Consultants: PCCM, radiation oncology, oncology  Procedures: Endobronchial biopsy, thoracentesis  Antimicrobials:  Anti-infectives (From admission, onward)   None      Subjective: Patient seen and examined the bedside this morning.  Hemodynamically stable.  On supplemental oxygen.  Appears very weak, not in respiratory distress but complains of shortness of breath.  Objective: Vitals:   09/29/19 1517 09/29/19 2133 09/30/19 0630 09/30/19 0630  BP:  (!) 185/84  (!) 147/103  Pulse:  (!) 102  98  Resp:  18  18  Temp:  98.1 F (36.7 C)  97.7 F (36.5 C)  TempSrc:  Oral  Oral  SpO2:  96%  97%  Weight: 87.5 kg  86.5 kg   Height: 5\' 7"  (1.702 m)       Intake/Output Summary (Last 24 hours) at 09/30/2019 1323 Last data filed at 09/30/2019 0600 Gross per 24 hour  Intake 578.97 ml  Output 500 ml  Net 78.97 ml   Filed Weights   09/27/19 0949 09/29/19 1517 09/30/19 0630  Weight: 87.5 kg 87.5 kg 86.5 kg    Examination:  General exam: Chronically ill looking, generalized weakness HEENT:Oral mucosa moist, Ear/Nose normal on gross exam Respiratory system: Bilateral decreased air entry Cardiovascular system: S1 & S2 heard, RRR. No JVD, murmurs, rubs, gallops or clicks. No pedal edema. Gastrointestinal system: Abdomen is nondistended, soft and nontender. No organomegaly or masses felt. Normal bowel sounds heard. Central nervous system: Alert and oriented. No focal neurological deficits. Extremities: Upper extremities edema,neck/face swelling, no clubbing ,no cyanosis Skin: No rashes, lesions or ulcers,no icterus ,no pallor   Data Reviewed: I have personally reviewed following labs and imaging studies  CBC: Recent Labs  Lab 09/26/19 0434 09/27/19 0232 09/28/19 0536 09/29/19 0527 09/30/19 0500  WBC 13.2* 11.6* 9.4 8.4 10.1  HGB 10.9* 9.9* 9.5* 9.6* 10.4*  HCT 35.6* 31.9* 30.0* 31.4* 34.5*  MCV 84.0 84.4 81.5 83.3 84.6  PLT 203 183 216 212 409   Basic Metabolic Panel: Recent Labs  Lab 09/25/19 0317 09/27/19 0232 09/28/19 0955 09/28/19 1437 09/29/19 0527 09/30/19 0500  NA 126* 128* 131*  --  132* 131*  K 4.5 4.8 5.2* 5.2* 5.2* 4.6  CL 93* 99 102  --  102 100  CO2 20* 20* 22  --  22 22  GLUCOSE 116* 157* 163*  --  113* 123*  BUN 20 33* 27*  --  20 17  CREATININE 0.68 0.91 0.80  --  0.70 0.64  CALCIUM 9.2 9.1 8.9  --  8.9 8.7*  MG  --  2.3 2.0  --  1.8 1.8  PHOS  --  2.8 2.4*  --  3.0  --    GFR: Estimated Creatinine Clearance: 65.5 mL/min (by C-G formula based on SCr of 0.64 mg/dL). Liver Function Tests: Recent Labs  Lab 09/24/19 0500 09/27/19 0232  09/28/19 0955 09/30/19 0500  AST 25 26 25 22   ALT 14 15 14 15   ALKPHOS 172* 146* 141* 123  BILITOT 0.6 0.7 0.7 0.7  PROT 7.1 6.4* 6.1* 5.0*  ALBUMIN 3.1* 2.9* 2.5* 2.2*   No results for input(s): LIPASE, AMYLASE in the last 168 hours. No results for input(s): AMMONIA in the last 168 hours. Coagulation Profile: Recent Labs  Lab 09/24/19 1651  INR 1.3*   Cardiac Enzymes: No results for input(s): CKTOTAL, CKMB, CKMBINDEX, TROPONINI in the last 168 hours. BNP (last 3 results) No results for input(s): PROBNP in the last 8760 hours. HbA1C: No results for input(s): HGBA1C in the last 72 hours. CBG: No results for input(s): GLUCAP in the last 168 hours. Lipid Profile: No results for input(s): CHOL, HDL, LDLCALC, TRIG, CHOLHDL, LDLDIRECT in the last 72 hours. Thyroid Function Tests: No results for input(s): TSH, T4TOTAL, FREET4, T3FREE, THYROIDAB in the last 72 hours. Anemia Panel: No results for input(s): VITAMINB12, FOLATE, FERRITIN, TIBC, IRON, RETICCTPCT in the last 72 hours. Sepsis Labs: Recent Labs  Lab 09/30/19 0500  LATICACIDVEN 1.4    Recent Results (from the past 240 hour(s))  SARS CORONAVIRUS 2 (TAT 6-24 HRS) Nasopharyngeal Nasopharyngeal Swab     Status: None   Collection Time: 09/23/19  2:51 PM  Specimen: Nasopharyngeal Swab  Result Value Ref Range Status   SARS Coronavirus 2 NEGATIVE NEGATIVE Final    Comment: (NOTE) SARS-CoV-2 target nucleic acids are NOT DETECTED. The SARS-CoV-2 RNA is generally detectable in upper and lower respiratory specimens during the acute phase of infection. Negative results do not preclude SARS-CoV-2 infection, do not rule out co-infections with other pathogens, and should not be used as the sole basis for treatment or other patient management decisions. Negative results must be combined with clinical observations, patient history, and epidemiological information. The expected result is Negative. Fact Sheet for  Patients: SugarRoll.be Fact Sheet for Healthcare Providers: https://www.woods-mathews.com/ This test is not yet approved or cleared by the [place] FDA and  has been authorized for detection and/or diagnosis of SARS-CoV-2 by FDA under an Emergency Use Authorization (EUA). This EUA will remain  in effect (meaning this test can be used) for the duration of the COVID-19 declaration under Section 56 4(b)(1) of the Act, 21 U.S.C. section 360bbb-3(b)(1), unless the authorization is terminated or revoked sooner. Performed at [facility], [place] [address] [place], [place] [address]   Body fluid culture     Status: None (Preliminary result)   Collection Time: 09/29/19 12:50 PM   Specimen: PATH Cytology Pleural fluid  Result Value Ref Range Status   Specimen Description   Final    PLEURAL Performed at [place] [address], [place], [place] [address]    Special Requests   Final    NONE Performed at [facility], [place] [address] [place], [place] [address]    Gram Stain   Final    RARE WBC PRESENT,BOTH PMN AND MONONUCLEAR RARE GRAM NEGATIVE RODS Performed at [facility], [place] [address] [place],  [address]    Culture Emmit [place] NEGATIVE RODS  Final   Report Status PENDING  Incomplete         Radiology Studies: CT CHEST W CONTRAST  Result Date: 09/29/2019 CLINICAL DATA:  Pneumonia, effusion or abscess suspected. EXAM: CT CHEST WITH CONTRAST TECHNIQUE: Multidetector CT imaging of the chest was performed during intravenous contrast administration. CONTRAST:  16mL OMNIPAQUE IOHEXOL 300 MG/ML  SOLN COMPARISON:  09/23/2019. FINDINGS: Cardiovascular: Extensive collateral venous flow within the mediastinum and left chest. Occlusion of the SVC. Right pulmonary artery is encased and markedly narrowed. Atherosclerotic calcification of the aorta and aortic valve. Heart size normal. No  pericardial effusion. Mediastinum/Nodes: Image quality is degraded by extensive streak artifact patient's arms and collateral venous flow. Ill-defined soft tissue is seen in the subcarinal region, extending along the right hilum with narrowing and encasement of the right middle and right lower lobe bronchi. Lungs/Pleura: Right upper lobe bronchus is now completely obstructed with collapse/consolidation in the right upper lobe, obscuring the previously seen medial right upper lobe mass. Right middle lobe obstruction is again seen with collapse of the right middle lobe. Large right pleural effusion, slightly decreased from 09/23/2019 status post thoracentesis earlier today. Associated collapse/consolidation in the right lower lobe. Small to moderate simple appearing left pleural effusion with compressive atelectasis in the left lower lobe. Multiple hematogenously distributed shaggy bilateral pulmonary nodules, as on 09/23/2019. Upper Abdomen: Altered perfusion within the left hepatic lobe is related to SVC occlusion and extensive collateral venous flow. 1.4 cm low-attenuation lesion in the dome of the liver is difficult to characterize due to size. Visualized portions of the liver and gallbladder are otherwise unremarkable. Thickening of the right adrenal gland. Left  adrenal gland is grossly unremarkable. Low-attenuation lesions in the kidneys measure up to 2.4 cm on the right and are likely cysts. 6 mm hyperattenuating lesion in the upper pole right kidney is too small to characterize. Visualized portions of the spleen and stomach are grossly unremarkable. Musculoskeletal: Degenerative changes in the spine. There are patchy areas of sclerosis throughout spine, as on the prior exam. IMPRESSION: 1. Right upper lobe/right perihilar mass with interval complete obstruction of the right upper lobe bronchus and resultant collapse/consolidation of the right upper lobe. Persistent right middle lobe occlusion with associated  atelectasis. Multiple bilateral hematogenously distributed pulmonary nodules and osseous metastases. Findings are most indicative of stage IV lung cancer. 2. Associated marked stenosis/occlusion of the superior vena cava and marked narrowing of the right pulmonary artery. 3. Large right pleural effusion, slightly decreased from 09/23/2019 after thoracentesis earlier today. No pneumothorax. Associated collapse/consolidation in the right lower lobe. 4. Small to moderate simple left pleural effusion. 5. Lesion in the hepatic dome, better seen on CT abdomen 09/26/2019. 6. Right adrenal thickening, described as a nodule on 09/26/2019. Please refer to that study for further details and discussion. Electronically Signed   By: Lorin Picket M.D.   On: 09/29/2019 16:15   DG Chest Port 1 View  Result Date: 09/29/2019 CLINICAL DATA:  Right pleural effusion. Status post thoracentesis. Metastatic lung cancer. Pulmonary emboli. EXAM: PORTABLE CHEST 1 VIEW 1:01 p.m. COMPARISON:  09/29/2019 at 11:01 a.m. and CT scan of the chest dated 09/23/2019 FINDINGS: There has been a significant decrease in the large right pleural effusion with re-aeration of portions of the right lung after thoracentesis. No pneumothorax. Moderate residual right pleural effusion. Multiple small metastatic nodules are noted throughout the left lung. Much more apparent on the CT scan of 09/23/2019. Heart size and pulmonary vascularity are normal. Aortic atherosclerosis. IMPRESSION: 1. No pneumothorax after thoracentesis. 2. Decrease in the large right pleural effusion. 3. Residual moderate right pleural effusion. 4. Multiple metastatic nodules throughout the left lung. 5.  Aortic Atherosclerosis (ICD10-I70.0). Electronically Signed   By: Lorriane Shire M.D.   On: 09/29/2019 13:16   DG CHEST PORT 1 VIEW  Result Date: 09/29/2019 CLINICAL DATA:  Shortness of breath, uterine cancer, adenocarcinoma RIGHT lung stage IV EXAM: PORTABLE CHEST 1 VIEW  COMPARISON:  Portable exam 1101 hours compared to 09/26/2019 FINDINGS: Normal heart size and mediastinal contours. Atherosclerotic calcification aorta. Complete opacification of the RIGHT hemithorax new since the previous exam likely representing a combination of pleural effusion and atelectasis. Possibility of mucous plugging raised due to the rapid interval change since 09/26/2019. Mild atelectasis and tiny effusion at LEFT base. Questionable nodular density 8 mm diameter lower LEFT lung. No pneumothorax or acute osseous findings. IMPRESSION: Complete opacification of RIGHT hemithorax new since 09/26/2019 likely a combination of pleural effusion and atelectasis question mucous plugging. Atelectasis and tiny effusion at LEFT lung base with questionable 8 mm lung nodule lower LEFT lung. Electronically Signed   By: Lavonia Dana M.D.   On: 09/29/2019 11:20   US THORACENTESIS ASP PLEURAL SPACE W/IMG GUIDE  Result Date: 09/29/2019 INDICATION: Patient with history of metastatic lung cancer, recurrent right pleural effusion status post thoracentesis 09/26/2019 yielding 900 mL pleural fluid. New complete opacification right hemithorax seen on chest x-ray today. Request to IR for repeat diagnostic and therapeutic right-sided thoracentesis. EXAM: ULTRASOUND GUIDED RIGHT THORACENTESIS MEDICATIONS: 10 mL 1% lidocaine COMPLICATIONS: None immediate. PROCEDURE: An ultrasound guided thoracentesis was thoroughly discussed with the patient and questions answered. The  benefits, risks, alternatives and complications were also discussed. The patient understands and wishes to proceed with the procedure. Written consent was obtained. Ultrasound was performed to localize and mark an adequate pocket of fluid in the right chest. The area was then prepped and draped in the normal sterile fashion. 1% Lidocaine was used for local anesthesia. Under ultrasound guidance a 6 Fr Safe-T-Centesis catheter was introduced. Thoracentesis was  performed. The catheter was removed and a dressing applied. FINDINGS: A total of approximately 600 mL of clear, light amber fluid was removed. Samples were sent to the laboratory as requested by the clinical team. IMPRESSION: Successful ultrasound guided right thoracentesis yielding 600 mL of pleural fluid. Read by Candiss Norse, PA-C Electronically Signed   By: Jacqulynn Cadet M.D.   On: 09/29/2019 13:20        Scheduled Meds: . Chlorhexidine Gluconate Cloth  6 each Topical Daily  . feeding supplement (ENSURE ENLIVE)  237 mL Oral BID BM  . feeding supplement (PRO-STAT SUGAR FREE 64)  30 mL Oral BID  . guaiFENesin  600 mg Oral BID  . hydrocerin   Topical BID  . multivitamin with minerals  1 tablet Oral Daily  . senna-docusate  1 tablet Oral BID  . sodium chloride flush  10-40 mL Intracatheter Q12H  . sodium chloride flush  3 mL Intravenous Q12H   Continuous Infusions: . sodium chloride    . heparin 1,100 Units/hr (09/30/19 0600)     LOS: 7 days    Time spent: 35 mins.More than 50% of that time was spent in counseling and/or coordination of care.      Shelly Coss, MD [facility] Pager [phone]  If 7PM-7AM, please contact night-coverage [email or web address] Password [email or web address] 09/30/2019, 1:23 PM

## 2019-10-01 ENCOUNTER — Ambulatory Visit
Admit: 2019-10-01 | Discharge: 2019-10-01 | Disposition: A | Discharge status: Home / Self Care | Payer: Medicare Other | Attending: Radiation Oncology | Admitting: Radiation Oncology

## 2019-10-01 ENCOUNTER — Inpatient Hospital Stay (HOSPITAL_COMMUNITY): Payer: Medicare Other

## 2019-10-01 LAB — CBC
HCT: 32 % — ABNORMAL LOW (ref 36.0–46.0)
Hemoglobin: 9.7 g/dL — ABNORMAL LOW (ref 12.0–15.0)
MCH: 25.5 pg — ABNORMAL LOW (ref 26.0–34.0)
MCHC: 30.3 g/dL (ref 30.0–36.0)
MCV: 84.2 fL (ref 80.0–100.0)
Platelets: 259 10*3/uL (ref 150–400)
RBC: 3.8 MIL/uL — ABNORMAL LOW (ref 3.87–5.11)
RDW: 16.1 % — ABNORMAL HIGH (ref 11.5–15.5)
WBC: 11.2 10*3/uL — ABNORMAL HIGH (ref 4.0–10.5)
nRBC: 0 % (ref 0.0–0.2)

## 2019-10-01 LAB — BODY FLUID CULTURE

## 2019-10-01 LAB — HEPARIN LEVEL (UNFRACTIONATED): Heparin Unfractionated: 0.26 IU/mL — ABNORMAL LOW (ref 0.30–0.70)

## 2019-10-01 MED ORDER — HEPARIN (PORCINE) 25000 UT/250ML-% IV SOLN
1250.0000 [IU]/h | INTRAVENOUS | Status: DC
  Administered 2019-10-01: 1100 [IU]/h via INTRAVENOUS
  Administered 2019-10-02 – 2019-10-07 (×7): 1250 [IU]/h via INTRAVENOUS
  Filled 2019-10-01 (×8): qty 250

## 2019-10-01 MED ORDER — FENTANYL CITRATE (PF) 100 MCG/2ML IJ SOLN
INTRAMUSCULAR | Status: AC
  Administered 2019-10-01: 50 ug via INTRAVENOUS
  Filled 2019-10-01: qty 2

## 2019-10-01 MED ORDER — SODIUM CHLORIDE 0.9 % IV SOLN
2.0000 g | INTRAVENOUS | Status: DC
  Administered 2019-10-01 – 2019-10-05 (×5): 2 g via INTRAVENOUS
  Filled 2019-10-01 (×2): qty 2
  Filled 2019-10-01: qty 20
  Filled 2019-10-01 (×2): qty 2

## 2019-10-01 MED ORDER — CEFAZOLIN SODIUM-DEXTROSE 2-4 GM/100ML-% IV SOLN
2.0000 g | Freq: Once | INTRAVENOUS | Status: AC
  Administered 2019-10-01: 2 g via INTRAVENOUS
  Filled 2019-10-01: qty 100

## 2019-10-01 MED ORDER — MIDAZOLAM HCL 2 MG/2ML IJ SOLN
INTRAMUSCULAR | Status: AC
  Administered 2019-10-01: 1 mg via INTRAVENOUS
  Filled 2019-10-01: qty 2

## 2019-10-01 MED ORDER — MIDAZOLAM HCL 2 MG/2ML IJ SOLN: 1.0000 mg | Freq: Once | INTRAMUSCULAR | Status: AC

## 2019-10-01 MED ORDER — FENTANYL CITRATE (PF) 100 MCG/2ML IJ SOLN: 50.0000 ug | Freq: Once | INTRAMUSCULAR | Status: AC

## 2019-10-01 NOTE — Progress Notes (Signed)
   NAME:  Emily Kim, MRN:  [id number], DOB:  1940/11/15, LOS: 8 ADMISSION DATE:  09/23/2019, CONSULTATION DATE: 09/29/2019 REFERRING MD: Berle Mull, CHIEF COMPLAINT: Metastatic lung cancer  Brief History   79 year old lady admitted with shortness of breath, hemoptysis Has had 50 pound weight loss over the last 3 months intermittent hemoptysis over 2 weeks Initial evaluation did reveal lung mass, pleural effusion Underwent bronchoscopy with biopsy-adenocarcinoma-disease is widely metastatic Oncology on board  Past Medical History   Past Medical History:  Diagnosis Date  . Uterine cancer ([place])      Significant Hospital Events   Had bronchoscopy with endobronchial ultrasound on 1/ 6  Consults:  Oncology Cardiothoracic surgery  Procedures:  Bronchoscopy 1/7  Significant Diagnostic Tests:  Surgical pathology 1/7-adenocarcinoma Cytology 1/7-non-small cell lung cancer CT scan of the chest significant for pulmonary emboli Pleural fluid cytology positive  Micro Data:  Pleural fluid culture 1/11   Antimicrobials:  Cefazolin 1/7  Interim history/subjective:  Still sob  Rested well Denies any pain or discomfort  Objective   Blood pressure (!) 117/54, pulse 94, temperature (!) 97.5 F (36.4 C), temperature source Oral, resp. rate (!) 24, height 5\' 7"  (1.702 m), weight 90.9 kg, SpO2 100 %.        Intake/Output Summary (Last 24 hours) at 10/01/2019 0850 Last data filed at 10/01/2019 4098 Gross per 24 hour  Intake 280.6 ml  Output 1200 ml  Net -919.4 ml   Filed Weights   09/30/19 0630 09/30/19 1716 10/01/19 0436  Weight: 86.5 kg 90.4 kg 90.9 kg    Examination: General this is a 79 year old female resting in bed.  Does not appear to be in distress HENT moist oral mucosa Pulm: Poor air movement bilaterally, dullness on the right Card S1-S2 appreciated abd: Soft, nontender Bilateral upper extremity edema Neuro intact  Resolved Hospital Problem list      Assessment & Plan:  Metastatic adenocarcinoma of the lung with postobstructive atelectasis, multiple metastatic deposits, malignant pleural effusion, evidence of lymphangitic spread of cancer and multiple bone metastases -Continue palliative radiation treatment -Oxygen as indicated -Pleurx catheter placement for management of pleural fluid -This is for comfort -Lung with significant pathology and will not fully reexpand -Appreciate palliative care involvement  Pulmonary embolism  -Likely secondary to widely metastatic disease -Continue anticoagulation -Currently held for Pleurx catheter placement today  Superior vena cava syndrome -Palliative radiation treatment   For Pleurx catheter placement today  Sherrilyn Rist, MD [place], PCCM Cell: [phone]

## 2019-10-01 NOTE — Progress Notes (Signed)
Pleural fluid resulted as klebsiella and Eschericia coli in fluid.  Fluid was transudative with normal glucose  Fluid drained during placement of pleurx catheter was amber colored , does not appear like an empyema.  Resend for gram stain and cultures  Started on Rocephin 2gm daily

## 2019-10-01 NOTE — Progress Notes (Signed)
PROGRESS NOTE    Emily Kim  VZC:[id number] DOB: 1941/02/03 DOA: 09/23/2019 PCP: Patient, No Pcp Per   Brief Narrative:  Patient is a 79 year old female with history of uterine cancer status post hysterectomy who presented with shortness of breath, hemoptysis for 2 weeks, weight loss.  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 Started on heparin drip.  Underwent Video bronchoscopy with endobronchial ultrasound for needle aspiration, brushing, endobronchial biopsy.  Biopsy showed non-small cell lung cancer, metastatic.  Being followed by PCCM, radiation oncology, oncology.  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  Also has opacification of the right hemithorax from reaccumulation of pleural effusion.  PCCM planning for Pleurx.  Assessment & Plan:   Principal Problem:   Pulmonary embolism (HCC) Active Problems:   SVC syndrome   Pleural effusion   Adenocarcinoma of right lung, stage 4 (HCC)   Difficult intravenous access   Pulmonary emboli: Presents with dyspnea.  Found to be hypoxic on presentation.  Currently in 3 L of oxygen per minute.  Currently on heparin drip.  Pulmonary emboli is likely due to metastatic lung cancer.  Metastatic lung cancer with lymphangitic spread, bone metastasis, malignant effusion of the right pleura: Presented with weight loss, shortness of breath.  Underwent diagnostic procedure by cardiothoracic team.  Biopsy showed metastatic non-small cell lung cancr e( adenocarcinoma).  Oncology, radiation oncology following. CT chest on 1/11/ showed obstruction of the right upper lobe bronchus and resultant collapse/consolidation of the right upper lobe. Persistent right middle lobe occlusion with associated atelectasis. Multiple bilateral hematogenously distributed  pulmonary nodules and osseous metastases. CT abd/pelvis showed three  scattered indeterminate hypodense liver masses,largest 2.4 cm in segment 2, suspicious for liver metastases. ndeterminate 1.5 cm right adrenal nodule, suspicious for right adrenal metastasis.Widespread sclerotic osseous metastases throughout lumbar spine,sacrum and bilateral pelvic girdle, including in the left femoral neck predisposing to pathologic fracture.  Recurrent right pleural effusion: Underwent thoracentesis.  Chest x-ray done on 09/30/19  showed reaccumulation with complete opacification of right thorax.  PCCM following and planning for Pleurx.  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  Undergoing palliative radiation therapy.  Imaging showed marked stenosis/occlusion of the superior vena cava and marked narrowing of the right pulmonary artery  Hyponatremia: Stable.  Continue to monitor.  Goals of care: Palliative care consulted.  Patient might be a candidate for hospice.  PT/OT recommended skilled nursing facility.       Nutrition Problem: Increased nutrient needs Etiology: acute illness, cancer and cancer related treatments      DVT prophylaxis: IV heparin Code Status: Full code Family Communication: Talked to Son Nohea Kras on 10/01/19 Disposition Plan: SNF after full work up   Consultants: PCCM, radiation oncology, oncology  Procedures: Endobronchial biopsy, thoracentesis  Antimicrobials:  Anti-infectives (From admission, onward)   Start     Dose/Rate Route Frequency Ordered Stop   10/01/19 1130  ceFAZolin (ANCEF) IVPB 2g/100 mL premix     2 g 200 mL/hr over 30 Minutes Intravenous  Once 10/01/19 1033 10/01/19 1258      Subjective: Patient seen and examined the bedside this morning.  Hemodynamically stable.  Was coughing bringing of sputum.  Not in acute respiratory distress.  Very emotional about her condition.  Objective: Vitals:   10/01/19 1200  10/01/19 1300 10/01/19 1303 10/01/19 1306  BP: Marland Kitchen)  120/42 (!) 104/55 107/78 107/78  Pulse:  (P) 73 93   Resp: (!) 22 16 16 19   Temp:      TempSrc:      SpO2:  97% 98%   Weight:      Height:        Intake/Output Summary (Last 24 hours) at 10/01/2019 1307 Last data filed at 10/01/2019 2952 Gross per 24 hour  Intake 280.6 ml  Output 1200 ml  Net -919.4 ml   Filed Weights   09/30/19 0630 09/30/19 1716 10/01/19 0436  Weight: 86.5 kg 90.4 kg 90.9 kg    Examination:  General exam: Chronically ill looking, generalized weakness HEENT:Oral mucosa moist, Ear/Nose normal on gross exam Respiratory system: Bilateral decreased air entry Cardiovascular system: S1 & S2 heard, RRR. No JVD, murmurs, rubs, gallops or clicks. No pedal edema. Gastrointestinal system: Abdomen is nondistended, soft and nontender. No organomegaly or masses felt. Normal bowel sounds heard. Central nervous system: Alert and oriented. No focal neurological deficits. Extremities: Upper extremities edema,neck/face swelling, no clubbing ,no cyanosis Skin: No rashes, lesions or ulcers,no icterus ,no pallor   Data Reviewed: I have personally reviewed following labs and imaging studies  CBC: Recent Labs  Lab 09/27/19 0232 09/28/19 0536 09/29/19 0527 09/30/19 0500 10/01/19 0154  WBC 11.6* 9.4 8.4 10.1 11.2*  HGB 9.9* 9.5* 9.6* 10.4* 9.7*  HCT 31.9* 30.0* 31.4* 34.5* 32.0*  MCV 84.4 81.5 83.3 84.6 84.2  PLT 183 216 212 238 841   Basic Metabolic Panel: Recent Labs  Lab 09/25/19 0317 09/27/19 0232 09/28/19 0955 09/28/19 1437 09/29/19 0527 09/30/19 0500  NA 126* 128* 131*  --  132* 131*  K 4.5 4.8 5.2* 5.2* 5.2* 4.6  CL 93* 99 102  --  102 100  CO2 20* 20* 22  --  22 22  GLUCOSE 116* 157* 163*  --  113* 123*  BUN 20 33* 27*  --  20 17  CREATININE 0.68 0.91 0.80  --  0.70 0.64  CALCIUM 9.2 9.1 8.9  --  8.9 8.7*  MG  --  2.3 2.0  --  1.8 1.8  PHOS  --  2.8 2.4*  --  3.0  --    GFR: Estimated Creatinine  Clearance: 67.1 mL/min (by C-G formula based on SCr of 0.64 mg/dL). Liver Function Tests: Recent Labs  Lab 09/27/19 0232 09/28/19 0955 09/30/19 0500  AST 26 25 22   ALT 15 14 15   ALKPHOS 146* 141* 123  BILITOT 0.7 0.7 0.7  PROT 6.4* 6.1* 5.0*  ALBUMIN 2.9* 2.5* 2.2*   No results for input(s): LIPASE, AMYLASE in the last 168 hours. No results for input(s): AMMONIA in the last 168 hours. Coagulation Profile: Recent Labs  Lab 09/24/19 1651  INR 1.3*   Cardiac Enzymes: No results for input(s): CKTOTAL, CKMB, CKMBINDEX, TROPONINI in the last 168 hours. BNP (last 3 results) No results for input(s): PROBNP in the last 8760 hours. HbA1C: No results for input(s): HGBA1C in the last 72 hours. CBG: No results for input(s): GLUCAP in the last 168 hours. Lipid Profile: No results for input(s): CHOL, HDL, LDLCALC, TRIG, CHOLHDL, LDLDIRECT in the last 72 hours. Thyroid Function Tests: No results for input(s): TSH, T4TOTAL, FREET4, T3FREE, THYROIDAB in the last 72 hours. Anemia Panel: No results for input(s): VITAMINB12, FOLATE, FERRITIN, TIBC, IRON, RETICCTPCT in the last 72 hours. Sepsis Labs: Recent Labs  Lab 09/30/19 0500  LATICACIDVEN 1.4    Recent Results (from the past 240 hour(s))  SARS CORONAVIRUS 2 (TAT 6-24 HRS) Nasopharyngeal Nasopharyngeal Swab     Status: None   Collection Time: 09/23/19  2:51 PM   Specimen: Nasopharyngeal Swab  Result Value Ref Range Status   SARS Coronavirus 2 NEGATIVE NEGATIVE Final    Comment: (NOTE) SARS-CoV-2 target nucleic acids are NOT DETECTED. The SARS-CoV-2 RNA is generally detectable in upper and lower respiratory specimens during the acute phase of infection. Negative results do not preclude SARS-CoV-2 infection, do not rule out co-infections with other pathogens, and should not be used as the sole basis for treatment or other patient management decisions. Negative results must be combined with clinical observations, patient history,  and epidemiological information. The expected result is Negative. Fact Sheet for Patients: SugarRoll.be Fact Sheet for Healthcare Providers: https://www.woods-mathews.com/ This test is not yet approved or cleared by the [place] FDA and  has been authorized for detection and/or diagnosis of SARS-CoV-2 by FDA under an Emergency Use Authorization (EUA). This EUA will remain  in effect (meaning this test can be used) for the duration of the COVID-19 declaration under Section 56 4(b)(1) of the Act, 21 U.S.C. section 360bbb-3(b)(1), unless the authorization is terminated or revoked sooner. Performed at [facility], [place] [address] [place], [place] [address]   Body fluid culture     Status: None   Collection Time: 09/29/19 12:50 PM   Specimen: PATH Cytology Pleural fluid  Result Value Ref Range Status   Specimen Description   Final    PLEURAL Performed at [facility] [address], [place], [place] [address]    Special Requests   Final    NONE Performed at [facility], [place] [address] [place], [place] [address]    Gram Stain   Final    RARE WBC PRESENT,BOTH PMN AND MONONUCLEAR RARE GRAM NEGATIVE RODS Performed at [facility], [place] [address], [place], [place] [address]    Culture   Final    ABUNDANT ESCHERICHIA COLI ABUNDANT KLEBSIELLA PNEUMONIAE    Report Status 10/01/2019 FINAL  Final   Organism ID, Bacteria ESCHERICHIA COLI  Final   Organism ID, Bacteria KLEBSIELLA PNEUMONIAE  Final      Susceptibility   Escherichia coli - MIC*    AMPICILLIN 4 SENSITIVE Sensitive     CEFAZOLIN <=4 SENSITIVE Sensitive     CEFEPIME <=0.12 SENSITIVE Sensitive     CEFTAZIDIME <=1 SENSITIVE Sensitive     CEFTRIAXONE <=0.25 SENSITIVE Sensitive     CIPROFLOXACIN >=4 RESISTANT Resistant     GENTAMICIN <=1 SENSITIVE Sensitive     IMIPENEM <=0.25 SENSITIVE Sensitive     TRIMETH/SULFA <=20 SENSITIVE  Sensitive     AMPICILLIN/SULBACTAM 4 SENSITIVE Sensitive     PIP/TAZO <=4 SENSITIVE Sensitive     * ABUNDANT ESCHERICHIA COLI   Klebsiella pneumoniae - MIC*    AMPICILLIN >=32 RESISTANT Resistant     CEFAZOLIN <=4 SENSITIVE Sensitive     CEFEPIME <=0.12 SENSITIVE Sensitive     CEFTAZIDIME <=1 SENSITIVE Sensitive     CEFTRIAXONE <=0.25 SENSITIVE Sensitive     CIPROFLOXACIN <=0.25 SENSITIVE Sensitive     GENTAMICIN <=1 SENSITIVE Sensitive     IMIPENEM <=0.25 SENSITIVE Sensitive     TRIMETH/SULFA <=20 SENSITIVE Sensitive     AMPICILLIN/SULBACTAM 8 SENSITIVE Sensitive     PIP/TAZO <=4 SENSITIVE Sensitive     * ABUNDANT KLEBSIELLA PNEUMONIAE  MRSA PCR Screening     Status: None   Collection Time: 09/30/19  5:17  PM   Specimen: Nasopharyngeal  Result Value Ref Range Status   MRSA by PCR NEGATIVE NEGATIVE Final    Comment:        The GeneXpert MRSA Assay (FDA approved for NASAL specimens only), is one component of a comprehensive MRSA colonization surveillance program. It is not intended to diagnose MRSA infection nor to guide or monitor treatment for MRSA infections. Performed at [facility], [place] [address] [place], [place] [address]          Radiology Studies: CT CHEST W CONTRAST  Result Date: 09/29/2019 CLINICAL DATA:  Pneumonia, effusion or abscess suspected. EXAM: CT CHEST WITH CONTRAST TECHNIQUE: Multidetector CT imaging of the chest was performed during intravenous contrast administration. CONTRAST:  75mL OMNIPAQUE IOHEXOL 300 MG/ML  SOLN COMPARISON:  09/23/2019. FINDINGS: Cardiovascular: Extensive collateral venous flow within the mediastinum and left chest. Occlusion of the SVC. Right pulmonary artery is encased and markedly narrowed. Atherosclerotic calcification of the aorta and aortic valve. Heart size normal. No pericardial effusion. Mediastinum/Nodes: Image quality is degraded by extensive streak artifact patient's arms and collateral venous flow.  Ill-defined soft tissue is seen in the subcarinal region, extending along the right hilum with narrowing and encasement of the right middle and right lower lobe bronchi. Lungs/Pleura: Right upper lobe bronchus is now completely obstructed with collapse/consolidation in the right upper lobe, obscuring the previously seen medial right upper lobe mass. Right middle lobe obstruction is again seen with collapse of the right middle lobe. Large right pleural effusion, slightly decreased from 09/23/2019 status post thoracentesis earlier today. Associated collapse/consolidation in the right lower lobe. Small to moderate simple appearing left pleural effusion with compressive atelectasis in the left lower lobe. Multiple hematogenously distributed shaggy bilateral pulmonary nodules, as on 09/23/2019. Upper Abdomen: Altered perfusion within the left hepatic lobe is related to SVC occlusion and extensive collateral venous flow. 1.4 cm low-attenuation lesion in the dome of the liver is difficult to characterize due to size. Visualized portions of the liver and gallbladder are otherwise unremarkable. Thickening of the right adrenal gland. Left adrenal gland is grossly unremarkable. Low-attenuation lesions in the kidneys measure up to 2.4 cm on the right and are likely cysts. 6 mm hyperattenuating lesion in the upper pole right kidney is too small to characterize. Visualized portions of the spleen and stomach are grossly unremarkable. Musculoskeletal: Degenerative changes in the spine. There are patchy areas of sclerosis throughout spine, as on the prior exam. IMPRESSION: 1. Right upper lobe/right perihilar mass with interval complete obstruction of the right upper lobe bronchus and resultant collapse/consolidation of the right upper lobe. Persistent right middle lobe occlusion with associated atelectasis. Multiple bilateral hematogenously distributed pulmonary nodules and osseous metastases. Findings are most indicative of stage  IV lung cancer. 2. Associated marked stenosis/occlusion of the superior vena cava and marked narrowing of the right pulmonary artery. 3. Large right pleural effusion, slightly decreased from 09/23/2019 after thoracentesis earlier today. No pneumothorax. Associated collapse/consolidation in the right lower lobe. 4. Small to moderate simple left pleural effusion. 5. Lesion in the hepatic dome, better seen on CT abdomen 09/26/2019. 6. Right adrenal thickening, described as a nodule on 09/26/2019. Please refer to that study for further details and discussion. Electronically Signed   By: Lorin Picket M.D.   On: 09/29/2019 16:15   DG Chest Port 1 View  Result Date: 09/30/2019 CLINICAL DATA:  History of uterine cancer. EXAM: PORTABLE CHEST 1 VIEW COMPARISON:  CT of 1 day prior.  Chest radiograph  of 1 day prior. FINDINGS: Patient rotated right. Normal heart size. Atherosclerosis in the transverse aorta. No pneumothorax. Near complete whiteout of the right hemithorax, significantly worsened aeration since the radiograph 1 day prior. Left-sided pulmonary nodules. IMPRESSION: Significantly worsened right-sided aeration, with near complete whiteout. Likely a combination of increasing pleural fluid and collapse, superimposed upon underlying mass as detailed on CT. Left-sided pulmonary nodules/metastasis, as before. Aortic Atherosclerosis (ICD10-I70.0). These results will be called to the ordering clinician or representative by the Radiologist Assistant, and communication documented in the PACS or zVision Dashboard. Electronically Signed   By: Abigail Miyamoto M.D.   On: 09/30/2019 13:55   US THORACENTESIS ASP PLEURAL SPACE W/IMG GUIDE  Result Date: 09/29/2019 INDICATION: Patient with history of metastatic lung cancer, recurrent right pleural effusion status post thoracentesis 09/26/2019 yielding 900 mL pleural fluid. New complete opacification right hemithorax seen on chest x-ray today. Request to IR for repeat diagnostic  and therapeutic right-sided thoracentesis. EXAM: ULTRASOUND GUIDED RIGHT THORACENTESIS MEDICATIONS: 10 mL 1% lidocaine COMPLICATIONS: None immediate. PROCEDURE: An ultrasound guided thoracentesis was thoroughly discussed with the patient and questions answered. The benefits, risks, alternatives and complications were also discussed. The patient understands and wishes to proceed with the procedure. Written consent was obtained. Ultrasound was performed to localize and mark an adequate pocket of fluid in the right chest. The area was then prepped and draped in the normal sterile fashion. 1% Lidocaine was used for local anesthesia. Under ultrasound guidance a 6 Fr Safe-T-Centesis catheter was introduced. Thoracentesis was performed. The catheter was removed and a dressing applied. FINDINGS: A total of approximately 600 mL of clear, light amber fluid was removed. Samples were sent to the laboratory as requested by the clinical team. IMPRESSION: Successful ultrasound guided right thoracentesis yielding 600 mL of pleural fluid. Read by Candiss Norse, PA-C Electronically Signed   By: Jacqulynn Cadet M.D.   On: 09/29/2019 13:20        Scheduled Meds: . Chlorhexidine Gluconate Cloth  6 each Topical Daily  . feeding supplement (ENSURE ENLIVE)  237 mL Oral BID BM  . feeding supplement (PRO-STAT SUGAR FREE 64)  30 mL Oral BID  . guaiFENesin  600 mg Oral BID  . hydrocerin   Topical BID  . mouth rinse  15 mL Mouth Rinse BID  . multivitamin with minerals  1 tablet Oral Daily  . senna-docusate  1 tablet Oral BID  . sodium chloride flush  10-40 mL Intracatheter Q12H  . sodium chloride flush  3 mL Intravenous Q12H   Continuous Infusions: . sodium chloride 250 mL (10/01/19 1127)     LOS: 8 days    Time spent: 35 mins.More than 50% of that time was spent in counseling and/or coordination of care.      Shelly Coss, MD [facility] Pager [phone]  If 7PM-7AM, please contact  night-coverage [email or web address] Password [email or web address] 10/01/2019, 1:07 PM

## 2019-10-01 NOTE — Progress Notes (Signed)
Attempted to see patient this afternoon.    She was undergoing procedure.    Following discussion yesterday, Emily Kim needed time to process before further discussion.  Order placed for spiritual care to assist in advance directive completion to name her son, Emily Kim, as her surrogate Media planner.  Plan to attempt follow up again tomorrow.   [name], MD [facility] Team [phone]  NO CHARGE NOTE

## 2019-10-01 NOTE — Progress Notes (Signed)
I called and spoke with [name], the patient's son to give him an update and to make sure he was aware of the cytologic findings, the imaging findings and her stage IV diagnosis. He is trying to come down to [place] either this weekend or early next week. He would like to be included in discussion from the primary and palliative team and his mother agrees that he is the point person to discuss her care with. She also reports no back pain or hip pain. 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 Lisbeth Renshaw may want to offer radiotherapy to these sites as well to reduce the risks of fracture. They were both in agreement pending his recommendations. We can coordinate this next week.      Carola Rhine, PAC

## 2019-10-01 NOTE — Procedures (Signed)
Chest Tube/right US guided Pleurx  Insertion Procedure Note  Indications:  Clinically significant malignant right pleural effusion w/ associated dyspnea   Pre-operative Diagnosis: malignant pleural effusion   Post-operative Diagnosis: same Procedure Details  Informed consent was obtained for the procedure, including sedation.  Risks of lung perforation, hemorrhage, arrhythmia, and adverse drug reaction were discussed.   After sterile skin prep, using standard technique, a 15.5French tube was placed in the right lateral 6th  rib space Using US guidance a guidewire was placed thru needle at the lateral right 7th rib. This was after two separate incisions were made at the insertion site of the needle/guidewire and a site approximately 10 cm anterior. Both localized w/ lidocaine. After this the catheter was tunneled, then the dilator, followed by the peel away dilator were passed over the guidewire. At this point the catheter was fed into the peel away guidewire until completely under the skin. At that point the two insertions sites were sutured closed and the pleural space drained.    Findings: 550 ml clear pleural fluid  Estimated Blood Loss:  Minimal         Specimens:  None and none               Complications:  None; patient tolerated the procedure well.         Disposition: ICU - extubated and stable.         Condition: stable  Erick Colace ACNP-BC [facility] Pager # [phone] OR # [phone] if no answer

## 2019-10-01 NOTE — Progress Notes (Signed)
Physical Therapy Treatment Patient Details Name: Emily Kim MRN: [id number] DOB: 1941/01/23 Today's Date: 10/01/2019    History of Present Illness 79 year old African-American female with past medical history significant for uterine cancer s/p hysterectomy [facility], [place]), apparently notes 50lbs of weight loss over the past 3 month and reformed tobacco user.  Apparently, patient smoked 1 pack of cigarettes daily for 15 years, but quit about 30 years ago.  Patient presented with shortness of breath and hemoptysis intermittently over the past 2 weeks.  CTA chest done on 09/22/2018 revealed acute segmental pulmonary emboli in the left lobe with likely metastatic lung cancer (CTA chest finding is documented below).  Patient is currently on heparin drip.  Patient underwent video bronchoscopy with endotracheal ultrasound for needle aspirations, brushings and endobronchial biopsies. Preliminary biopsy is said to be suggestive of non-small cell lung cancer, metastatic.  Patient was seen by the radiation oncology team and they started palliative radiotherapy yesterday for severe superior vena cava syndrome.    PT Comments    Pt quite lethargic today however arouses with multi-modal stimuli for participation in bil UE/bil LE exercises. Pt presetn with profound global weakness. Did not attempt EOB  today d/t pt lethargy.  Pt RUE weeping, bil UEs elevated on pillows and encouraged AROM hands/elbows as tolerated.  VSS with BP 125/55--cuff changed to LUE after checking with RN as RUE weeping small skin tear noted R antecubital space (RN aware).  HR 80s RR 15-27 SpO2=100% on 2L  Will continue PT POC for now, palliative is following--pt as of now is full scope of care. Continue to recommend SNF    Follow Up Recommendations  SNF;Supervision/Assistance - 24 hour     Equipment Recommendations  Other (comment)(defer to next venue)    Recommendations for Other Services       Precautions / Restrictions  Precautions Precautions: Fall Precaution Comments: on Heparin drip, therapeutic this date. bil UE edema,  R UE weeping--bil UEs elevated  Restrictions Weight Bearing Restrictions: No    Mobility  Bed Mobility               General bed mobility comments: deferred d/t lethargy, pt repeated falling asleep  Transfers                    Ambulation/Gait                 Stairs             Wheelchair Mobility    Modified Rankin (Stroke Patients Only)       Balance                                            Cognition Arousal/Alertness: Lethargic Behavior During Therapy: WFL for tasks assessed/performed Overall Cognitive Status: No family/caregiver present to determine baseline cognitive functioning Area of Impairment: Following commands;Problem solving;Safety/judgement                       Following Commands: Follows one step commands with increased time;Follows multi-step commands inconsistently Safety/Judgement: Decreased awareness of deficits   Problem Solving: Slow processing;Decreased initiation;Requires verbal cues;Requires tactile cues General Comments: decr insight into health issues-"why would my arms be swollen?"  Pt is extremely lethargic, arouses to voice easily but requires multi-modal  stim to participate      Exercises General Exercises - Upper Extremity  Shoulder Flexion: AAROM;Both;10 reps Elbow Flexion: Both;10 reps;AAROM Elbow Extension: AAROM;Both;10 reps Wrist Flexion: AAROM;Both;AROM;10 reps Wrist Extension: AAROM;AROM;Both;10 reps Digit Composite Flexion: AROM;Both;10 reps Composite Extension: AROM;Both;10 reps General Exercises - Lower Extremity Ankle Circles/Pumps: AROM;Both;10 reps Quad Sets: AROM;Both;10 reps Short Arc Quad: AROM;Both;10 reps Heel Slides: AAROM;Both;10 reps Straight Leg Raises: AAROM;Both;5 reps    General Comments        Pertinent Vitals/Pain Pain Assessment:  No/denies pain    Home Living                      Prior Function            PT Goals (current goals can now be found in the care plan section) Acute Rehab PT Goals Patient Stated Goal: go to ALF PT Goal Formulation: With patient Time For Goal Achievement: 10/13/19 Potential to Achieve Goals: Poor Progress towards PT goals: Progressing toward goals    Frequency    Min 2X/week      PT Plan Current plan remains appropriate    Co-evaluation              AM-PAC PT "6 Clicks" Mobility   Outcome Measure  Help needed turning from your back to your side while in a flat bed without using bedrails?: A Little Help needed moving from lying on your back to sitting on the side of a flat bed without using bedrails?: A Lot Help needed moving to and from a bed to a chair (including a wheelchair)?: Total Help needed standing up from a chair using your arms (e.g., wheelchair or bedside chair)?: Total Help needed to walk in hospital room?: Total Help needed climbing 3-5 steps with a railing? : Total 6 Click Score: 9    End of Session   Activity Tolerance: Patient limited by lethargy;Patient limited by fatigue Patient left: in bed;with call bell/phone within reach;with bed alarm set Nurse Communication: Mobility status PT Visit Diagnosis: Other abnormalities of gait and mobility (R26.89);Muscle weakness (generalized) (M62.81)     Time: 4388-8757 PT Time Calculation (min) (ACUTE ONLY): 29 min  Charges:  $Therapeutic Exercise: 23-37 mins                     Baxter Flattery, PT   Acute Rehab Dept [facility]): [phone]   10/01/2019    [facility] 10/01/2019, 3:56 PM

## 2019-10-01 NOTE — Consult Note (Addendum)
Palliative care consult note  Reason for consult: Goals of care in light of newly discovered widely metastatic adenocarcinoma  Chart reviewed including personal review of pertinent labs and imaging.  Discussed with bedside staff.  Briefly, Emily Kim is a 79 year old female with past medical history of uterine cancer status post hysterectomy who presented to the hospital following 50 pound weight loss as well as intermittent hemoptysis and shortness of breath.  She was discovered to have lung mass with pleural effusion and bronchoscopy revealed adenocarcinoma.  Further imaging has revealed significant metastatic disease including SVC syndrome for which she is currently undergoing radiation.  She also has widely metastatic disease throughout her chest as well as liver and multiple sclerotic lesions.  Her course is further complicated by pulmonary emboli.  palliative consulted for goals of care.  I met today with Emily Kim.  She was alone in her room.  I introduced palliative care as specialized medical care for people living with serious illness. It focuses on providing relief from the symptoms and stress of a serious illness. The goal is to improve quality of life for both the patient and the family.  We discussed her clinical course this hospitalization as well as her understanding of her disease.  She reports that she is still processing everything that is going on but that she has, "lived a good life if that is where we are."  Reports the most important things to her are her children (2 sons and 1 daughter) and her faith.  She has a son in [place] as well as a son and daughter in [place].  We talked about her understanding of her disease and the fact that this is not curable.  Discussed that the goal of any interventions moving forward is to add as much time in quality to her life as possible.  Discussed possibility of continuation of radiation and potential systemic immunotherapy options versus  focus only on her comfort and symptomatic therapy moving forward.  Reviewed that her overall clinical picture is further complicated by recurrent effusion as well as pulmonary emboli.  At this point, Emily Kim is clear that she desires to continue with aggressive interventions is open to any offered therapies.  She desires to remain full code.  She is agreeable to consideration of Pleurx catheter.  She also wants to continue with radiation therapy and does not want to make any further changes to her care plan until she has the opportunity to follow-up with Dr. Earlie Kim as an outpatient to discuss results of biopsy as well as potential systemic therapy.  I offered to call family today to discuss, and she declined for me to call anybody.  She does state that her son Emily Kim should be named as her Air traffic controller but she does not want me to call and update him today either.  -Full code/full scope treatment -She will need to name her son, Emily Kim, as her surrogate decision-maker.  Will ensure that spiritual care is aware of need for completion of HC POA. -At this point, Emily Kim remains invested in plan for any and all aggressive therapies.  She reports that she wants to work to get out of the hospital to follow-up with Dr. Earlie Kim as an outpatient as he discussed with her during consultation this hospitalization.  She does not feel comfortable making any changes to her care plan until she is able to follow-up with oncology to discuss results of biopsy as well as potential care options. -Today, she  declined for me to call any of her family, including declining for me to call her son Emily Kim. -Plan to continue to this hospitalization.  When she reaches a point of being able to be discharged, I recommend palliative care follow her as an outpatient.  Time in: 1800 Time out: 1900 Total time: 60 minutes  Greater than 50%  of this time was spent counseling and coordinating care related to the  above assessment and plan.  Emily Rough, MD [place] Team [phone]

## 2019-10-01 NOTE — Progress Notes (Signed)
ANTICOAGULATION CONSULT NOTE  Pharmacy Consult for heparin Indication: pulmonary embolus  Patient Measurements: Heparin Dosing Weight: 75 kg  Vital Signs: Temp: 97.5 F (36.4 C) (01/13 0748) Temp Source: Oral (01/13 0748) BP: 115/50 (01/13 0800) Pulse Rate: 94 (01/13 0200)  Labs: Recent Labs    09/29/19 0517 09/29/19 0527 09/30/19 0500 10/01/19 0154  HGB  --  9.6* 10.4* 9.7*  HCT  --  31.4* 34.5* 32.0*  PLT  --  212 238 259  HEPARINUNFRC 0.54  --  0.46 0.26*  CREATININE  --  0.70 0.64  --    Assessment: 79 y.o. female with newly diagnosed metastatic lung cancer as well as acute segmental PE in left lower lobe. Currently on heparin infusion for PE.  Today, 10/01/19  HL = 0.26 this morning was slightly subtherapeutic. Looks like there was ~15-20 min interruption in infusion around time of lab draw.   Heparin stopped at 0600 this morning for planned chest tube placement today.   CBC: Hgb 9.7 low but stable, Plts WNL and stable  No reported bleeding or infusion issues  Goal of Therapy:  Heparin level 0.3-0.7 units/ml Monitor platelets by anticoagulation protocol: Yes   Plan:   Resume heparin infusion 6 hours after chest tube placement per MD. Will resume heparin at previous rate of 1100 units/hr at that time with no bolus.  Check 8 hour HL  Daily CBC and heparin level while on infusion  Monitor for signs/symptoms of bleeding  Per Onc note 1/8: recommend Lovenox @ discharge, if Lovenox problematic use Xarelto  Lenis Noon PharmD, BCPS 10/01/2019,10:48 AM

## 2019-10-01 NOTE — Progress Notes (Signed)
Pharmacy Brief Note -  Pharmacy consulted to dose cefazolin for surgical ppx for Pleurx placement today.  NKDA. Wt 91 kg.   Cefazolin 2 g IV once at 1130  Pharmacy to sign off.   Lenis Noon, PharmD 10/01/19 10:43 AM

## 2019-10-02 ENCOUNTER — Inpatient Hospital Stay (HOSPITAL_COMMUNITY): Payer: Medicare Other

## 2019-10-02 ENCOUNTER — Ambulatory Visit
Admit: 2019-10-02 | Discharge: 2019-10-02 | Disposition: A | Discharge status: Home / Self Care | Payer: Medicare Other | Attending: Radiation Oncology | Admitting: Radiation Oncology

## 2019-10-02 ENCOUNTER — Other Ambulatory Visit: Payer: Self-pay | Admitting: Radiation Therapy

## 2019-10-02 DIAGNOSIS — C7951 Secondary malignant neoplasm of bone: Secondary | ICD-10-CM

## 2019-10-02 LAB — CBC
HCT: 30.2 % — ABNORMAL LOW (ref 36.0–46.0)
Hemoglobin: 9 g/dL — ABNORMAL LOW (ref 12.0–15.0)
MCH: 25.6 pg — ABNORMAL LOW (ref 26.0–34.0)
MCHC: 29.8 g/dL — ABNORMAL LOW (ref 30.0–36.0)
MCV: 86 fL (ref 80.0–100.0)
Platelets: 199 10*3/uL (ref 150–400)
RBC: 3.51 MIL/uL — ABNORMAL LOW (ref 3.87–5.11)
RDW: 16.4 % — ABNORMAL HIGH (ref 11.5–15.5)
WBC: 9.8 10*3/uL (ref 4.0–10.5)
nRBC: 0 % (ref 0.0–0.2)

## 2019-10-02 LAB — HEPARIN LEVEL (UNFRACTIONATED)
Heparin Unfractionated: 0.21 IU/mL — ABNORMAL LOW (ref 0.30–0.70)
Heparin Unfractionated: 0.44 IU/mL (ref 0.30–0.70)
Heparin Unfractionated: 0.49 IU/mL (ref 0.30–0.70)

## 2019-10-02 NOTE — Progress Notes (Signed)
Patient still unable to void.  Bladder scan from 20:00 reading 500cc.  Patient attempted to void several times without success.  Updated oncall PA, orders received.  Foley placed.  Foley draining slowly but 400cc drained over the following 20 minutes after placing foley.

## 2019-10-02 NOTE — Progress Notes (Signed)
I spoke with Gemma Payor, and let him know that Dr. Lisbeth Renshaw does recommend palliative radiotherapy at least to the left femur to reduce the risk of fracture. He is in agreement. We will plan her treatment to this site next Tuesday and he is aware that if she dc's prior to that time, the schedule would indicate her simulation appointment at discharge.     Carola Rhine, PAC

## 2019-10-02 NOTE — Progress Notes (Signed)
[facility] Therapy Treatment Record Phone [phone]   Radiation Therapy was administered to Emily Kim on: 10/02/2019  1:43 PM and was treatment # 6 out of a planned course of 10 treatments.  Radiation Treatment  1). Beam photons with 6-10 energy  2). Brachytherapy None  3). Stereotactic Radiosurgery None  4). Other Radiation None     Altan Kraai [name], RT (T)

## 2019-10-02 NOTE — Progress Notes (Addendum)
ANTICOAGULATION CONSULT NOTE  Pharmacy Consult for heparin Indication: pulmonary embolus  Patient Measurements: Heparin Dosing Weight: 75 kg  Vital Signs: Temp: 97.7 F (36.5 C) (01/14 1200) Temp Source: Oral (01/14 1200) BP: 102/37 (01/14 1000) Pulse Rate: 86 (01/14 1300)  Labs: Recent Labs    09/30/19 0500 09/30/19 0500 10/01/19 0154 10/02/19 0401 10/02/19 1501  HGB 10.4*   < > 9.7* 9.0*  --   HCT 34.5*  --  32.0* 30.2*  --   PLT 238  --  259 199  --   HEPARINUNFRC 0.46   < > 0.26* 0.21* 0.44  CREATININE 0.64  --   --   --   --    < > = values in this interval not displayed.   Assessment: 79 y.o. female with newly diagnosed metastatic lung cancer as well as acute segmental PE in left lower lobe. Currently on heparin infusion for PE.  Today, 10/02/19   Heparin level is therapeutic at 0.44 after rate increased from 1100 to 1250 units/hr  CBC: Hgb 9.0 low but stable, Plts WNL and stable  No reported bleeding or infusion issues  Goal of Therapy:  Heparin level 0.3-0.7 units/ml Monitor platelets by anticoagulation protocol: Yes   Plan:   Continue heparin at 1250 units/hr, check 8 hr confirmatory level  Daily CBC and heparin level while on infusion  Monitor for signs/symptoms of bleeding  Per Onc note 1/8: recommend Lovenox @ discharge, if Lovenox problematic use Xarelto  Eudelia Bunch, Pharm.D [phone] 10/02/2019 3:53 PM

## 2019-10-02 NOTE — Progress Notes (Signed)
   NAME:  Emily Kim, MRN:  [id number], DOB:  08-05-1941, LOS: 9 ADMISSION DATE:  09/23/2019, CONSULTATION DATE: 09/29/2019 REFERRING MD: Berle Mull, CHIEF COMPLAINT: Metastatic lung cancer  Brief History   79 year old lady admitted with shortness of breath, hemoptysis Has had 50 pound weight loss over the last 3 months intermittent hemoptysis over 2 weeks Initial evaluation did reveal lung mass, pleural effusion Underwent bronchoscopy with biopsy-adenocarcinoma-disease is widely metastatic Oncology on board Post Pleurx catheter placement 10/01/2019  Past Medical History   Past Medical History:  Diagnosis Date  . Uterine cancer ([place])      Significant Hospital Events   Had bronchoscopy with endobronchial ultrasound on 1/ 6  Consults:  Oncology Cardiothoracic surgery  Procedures:  Bronchoscopy 1/7 1/13 Pleurx catheter placement  Significant Diagnostic Tests:  Surgical pathology 1/7-adenocarcinoma Cytology 1/7-non-small cell lung cancer CT scan of the chest significant for pulmonary emboli Pleural fluid cytology positive  Micro Data:  Pleural fluid culture 1/11 Pleural fluid revealed Klebsiella and E. Coli Pleural fluid culture 1/13-no organism  Antimicrobials:  Cefazolin 1/7 Rocephin 1/13>> Interim history/subjective:  Resting comfortably in bed 700 cc of clear pleural fluid drained  Objective   Blood pressure (!) 127/46, pulse 87, temperature 97.6 F (36.4 C), temperature source Oral, resp. rate 16, height 5\' 7"  (1.702 m), weight 89.1 kg, SpO2 99 %.        Intake/Output Summary (Last 24 hours) at 10/02/2019 0942 Last data filed at 10/02/2019 0500 Gross per 24 hour  Intake 660.62 ml  Output 1040 ml  Net -379.38 ml   Filed Weights   09/30/19 1716 10/01/19 0436 10/02/19 0459  Weight: 90.4 kg 90.9 kg 89.1 kg    Examination: General this is a 79 year old female resting in bed.  Does not appear to be in distress HENT: Moist oral mucosa Pulm: Poor air  movement on the right Card: S1-S2 appreciated abd: Soft, nontender Bilateral upper extremity edema Neuro intact  Chest x-ray today shows resolution of pleural effusion on right, there may be some underlying loculation.  Lung will not fully reexpand as she has postobstructive atelectasis   Resolved Hospital Problem list     Assessment & Plan:   Metastatic adenocarcinoma of the lung with postobstructive atelectasis, multiple metastatic deposits, malignant pleural effusion, evidence of lymphangitic spread of cancer multiple metastases -Continue palliative radiation treatment -Oxygen supplementation as indicated -Pleurx catheter placed 10/01/2019 for comfort -Pleurx catheter placed on full drainage temporarily -Appreciate palliative care involvement  Pulmonary embolism -Secondary to widely metastatic lung cancer -Continue anticoagulation  Superior vena cava syndrome -Continue palliative radiation treatment  Sherrilyn Rist, MD [place], PCCM Cell: [phone]

## 2019-10-02 NOTE — Progress Notes (Addendum)
PROGRESS NOTE    Emily Kim  GNF:[id number] DOB: 1941/02/28 DOA: 09/23/2019 PCP: Emily Kim, No Pcp Per   Brief Narrative:  Emily Kim is a 79 year old female with history of uterine cancer status post hysterectomy who presented with shortness of breath, hemoptysis for 2 weeks, weight loss.  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 Started on heparin drip.  Underwent Video bronchoscopy with endobronchial ultrasound for needle aspiration, brushing, endobronchial biopsy.  Biopsy showed non-small cell lung cancer, metastatic.  Being followed by PCCM, radiation oncology, oncology.  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  She also had opacification of the right hemithorax from reaccumulation of pleural effusion.  Underwent  Pleurx cath placement on 10/01/19.  Assessment & Plan:   Principal Problem:   Pulmonary embolism (HCC) Active Problems:   SVC syndrome   Pleural effusion   Adenocarcinoma of right lung, stage 4 (HCC)   Difficult intravenous access   Pulmonary emboli: Presented with dyspnea.  Found to be hypoxic on presentation.  Currently in 2 L of oxygen per minute.  Started on heparin drip.  Pulmonary emboli is  due to metastatic lung cancer.  Metastatic lung cancer with lymphangitic spread, bone metastasis, malignant effusion of the right pleura: Presented with weight loss, shortness of breath.  Underwent diagnostic procedure by cardiothoracic team.  Biopsy showed metastatic non-small cell lung cancer( adenocarcinoma).  Oncology, radiation oncology following. CT chest on 1/11/ showed obstruction of the right upper lobe bronchus and resultant collapse/consolidation of the right upper lobe. Persistent right middle lobe occlusion with associated atelectasis. Multiple bilateral hematogenously  distributed pulmonary nodules and osseous metastases. CT abd/pelvis showed three  scattered indeterminate hypodense liver masses,largest 2.4 cm in segment 2, suspicious for liver metastases. ndeterminate 1.5 cm right adrenal nodule, suspicious for right adrenal metastasis.Widespread sclerotic osseous metastases throughout lumbar spine,sacrum and bilateral pelvic girdle, including in the left femoral neck predisposing to pathologic fracture. Undergoing radiation therapy. Palliative care also following  Recurrent right pleural effusion: S/p thoracentesis and Pleurx catheter placement.Pleural fluid culture sent on 09/29/19 showed showed pansensitive Ecoli and K Pneumoniae.On ceftriaxone.New culture pending.  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  Undergoing palliative radiation therapy.  Imaging showed marked stenosis/occlusion of the superior vena cava and marked narrowing of the right pulmonary artery  Hyponatremia: Stable.  Continue to monitor.  Goals of care: Palliative care consulted.  Emily Kim might be a candidate for hospice.  PT/OT recommended skilled nursing facility.       Nutrition Problem: Increased nutrient needs Etiology: acute illness, cancer and cancer related treatments      DVT prophylaxis: IV heparin Code Status: Full code Family Communication: Talked to Son Quetzali Heinle on 10/01/19 Disposition Plan: SNF after full work up   Consultants: PCCM, radiation oncology, oncology  Procedures: Endobronchial biopsy, thoracentesis, Pleurx catheter placement  Antimicrobials:  Anti-infectives (From admission, onward)   Start     Dose/Rate Route Frequency Ordered Stop   10/01/19 1600  cefTRIAXone (ROCEPHIN) 2 g in sodium chloride 0.9 % 100 mL IVPB     2 g 200 mL/hr over 30 Minutes Intravenous Every 24 hours 10/01/19 1441     10/01/19 1130  ceFAZolin (ANCEF) IVPB 2g/100 mL premix     2 g 200 mL/hr over 30 Minutes Intravenous  Once 10/01/19  1033 10/01/19 1258  Subjective: Emily Kim seen and examined at the bedside this morning.  She looks better.  Feels comfortable.  Cough, shortness of breath better today.  Denies any new complaints.  Objective: Vitals:   10/02/19 0733 10/02/19 0800 10/02/19 0900 10/02/19 1000  BP: (!) 127/46  (!) 120/47 (!) 102/37  Pulse:  87 85 88  Resp: 17 16 14  (!) 22  Temp:      TempSrc:      SpO2:  99% 98% 97%  Weight:      Height:        Intake/Output Summary (Last 24 hours) at 10/02/2019 1114 Last data filed at 10/02/2019 0500 Gross per 24 hour  Intake 660.62 ml  Output 1040 ml  Net -379.38 ml   Filed Weights   09/30/19 1716 10/01/19 0436 10/02/19 0459  Weight: 90.4 kg 90.9 kg 89.1 kg    Examination:  General exam: Chronically ill looking, generalized weakness HEENT:Oral mucosa moist, Ear/Nose normal on gross exam Respiratory system: Bilateral decreased air entry on bases but much better air entry today Right-sided Pleurx catheter attached to the suction Cardiovascular system: S1 & S2 heard, RRR. No JVD, murmurs, rubs, gallops or clicks. No pedal edema. Gastrointestinal system: Abdomen is nondistended, soft and nontender. No organomegaly or masses felt. Normal bowel sounds heard. Central nervous system: Alert and oriented. No focal neurological deficits. Extremities: Upper extremities edema,neck/face swelling, no clubbing ,no cyanosis Skin: No rashes, lesions or ulcers,no icterus ,no pallor   Data Reviewed: I have personally reviewed following labs and imaging studies  CBC: Recent Labs  Lab 09/28/19 0536 09/29/19 0527 09/30/19 0500 10/01/19 0154 10/02/19 0401  WBC 9.4 8.4 10.1 11.2* 9.8  HGB 9.5* 9.6* 10.4* 9.7* 9.0*  HCT 30.0* 31.4* 34.5* 32.0* 30.2*  MCV 81.5 83.3 84.6 84.2 86.0  PLT 216 212 238 259 151   Basic Metabolic Panel: Recent Labs  Lab 09/27/19 0232 09/28/19 0955 09/28/19 1437 09/29/19 0527 09/30/19 0500  NA 128* 131*  --  132* 131*  K 4.8 5.2*  5.2* 5.2* 4.6  CL 99 102  --  102 100  CO2 20* 22  --  22 22  GLUCOSE 157* 163*  --  113* 123*  BUN 33* 27*  --  20 17  CREATININE 0.91 0.80  --  0.70 0.64  CALCIUM 9.1 8.9  --  8.9 8.7*  MG 2.3 2.0  --  1.8 1.8  PHOS 2.8 2.4*  --  3.0  --    GFR: Estimated Creatinine Clearance: 66.4 mL/min (by C-G formula based on SCr of 0.64 mg/dL). Liver Function Tests: Recent Labs  Lab 09/27/19 0232 09/28/19 0955 09/30/19 0500  AST 26 25 22   ALT 15 14 15   ALKPHOS 146* 141* 123  BILITOT 0.7 0.7 0.7  PROT 6.4* 6.1* 5.0*  ALBUMIN 2.9* 2.5* 2.2*   No results for input(s): LIPASE, AMYLASE in the last 168 hours. No results for input(s): AMMONIA in the last 168 hours. Coagulation Profile: No results for input(s): INR, PROTIME in the last 168 hours. Cardiac Enzymes: No results for input(s): CKTOTAL, CKMB, CKMBINDEX, TROPONINI in the last 168 hours. BNP (last 3 results) No results for input(s): PROBNP in the last 8760 hours. HbA1C: No results for input(s): HGBA1C in the last 72 hours. CBG: No results for input(s): GLUCAP in the last 168 hours. Lipid Profile: No results for input(s): CHOL, HDL, LDLCALC, TRIG, CHOLHDL, LDLDIRECT in the last 72 hours. Thyroid Function Tests: No results for input(s): TSH, T4TOTAL, FREET4, T3FREE, THYROIDAB  in the last 72 hours. Anemia Panel: No results for input(s): VITAMINB12, FOLATE, FERRITIN, TIBC, IRON, RETICCTPCT in the last 72 hours. Sepsis Labs: Recent Labs  Lab 09/30/19 0500  LATICACIDVEN 1.4    Recent Results (from the past 240 hour(s))  SARS CORONAVIRUS 2 (TAT 6-24 HRS) Nasopharyngeal Nasopharyngeal Swab     Status: None   Collection Time: 09/23/19  2:51 PM   Specimen: Nasopharyngeal Swab  Result Value Ref Range Status   SARS Coronavirus 2 NEGATIVE NEGATIVE Final    Comment: (NOTE) SARS-CoV-2 target nucleic acids are NOT DETECTED. The SARS-CoV-2 RNA is generally detectable in upper and lower respiratory specimens during the acute phase of  infection. Negative results do not preclude SARS-CoV-2 infection, do not rule out co-infections with other pathogens, and should not be used as the sole basis for treatment or other Emily Kim management decisions. Negative results must be combined with clinical observations, Emily Kim history, and epidemiological information. The expected result is Negative. Fact Sheet for Patients: SugarRoll.be Fact Sheet for Healthcare Providers: https://www.woods-mathews.com/ This test is not yet approved or cleared by the [place] FDA and  has been authorized for detection and/or diagnosis of SARS-CoV-2 by FDA under an Emergency Use Authorization (EUA). This EUA will remain  in effect (meaning this test can be used) for the duration of the COVID-19 declaration under Section 56 4(b)(1) of the Act, 21 U.S.C. section 360bbb-3(b)(1), unless the authorization is terminated or revoked sooner. Performed at [facility], [place] [address] [place], [place] [address]   Body fluid culture     Status: None   Collection Time: 09/29/19 12:50 PM   Specimen: PATH Cytology Pleural fluid  Result Value Ref Range Status   Specimen Description   Final    PLEURAL Performed at [place] [address] [place], [place] [address]    Special Requests   Final    NONE Performed at [facility], [place] [address] [place], [place] [address]    Gram Stain   Final    RARE WBC PRESENT,BOTH PMN AND MONONUCLEAR RARE GRAM NEGATIVE RODS Performed at [facility], [place] [address] [place], [place] [address]    Culture   Final    ABUNDANT ESCHERICHIA COLI ABUNDANT KLEBSIELLA PNEUMONIAE    Report Status 10/01/2019 FINAL  Final   Organism ID, Bacteria ESCHERICHIA COLI  Final   Organism ID, Bacteria KLEBSIELLA PNEUMONIAE  Final      Susceptibility   Escherichia coli - MIC*    AMPICILLIN 4 SENSITIVE Sensitive     CEFAZOLIN <=4  SENSITIVE Sensitive     CEFEPIME <=0.12 SENSITIVE Sensitive     CEFTAZIDIME <=1 SENSITIVE Sensitive     CEFTRIAXONE <=0.25 SENSITIVE Sensitive     CIPROFLOXACIN >=4 RESISTANT Resistant     GENTAMICIN <=1 SENSITIVE Sensitive     IMIPENEM <=0.25 SENSITIVE Sensitive     TRIMETH/SULFA <=20 SENSITIVE Sensitive     AMPICILLIN/SULBACTAM 4 SENSITIVE Sensitive     PIP/TAZO <=4 SENSITIVE Sensitive     * ABUNDANT ESCHERICHIA COLI   Klebsiella pneumoniae - MIC*    AMPICILLIN >=32 RESISTANT Resistant     CEFAZOLIN <=4 SENSITIVE Sensitive     CEFEPIME <=0.12 SENSITIVE Sensitive     CEFTAZIDIME <=1 SENSITIVE Sensitive     CEFTRIAXONE <=0.25 SENSITIVE Sensitive     CIPROFLOXACIN <=0.25 SENSITIVE Sensitive     GENTAMICIN <=1 SENSITIVE Sensitive     IMIPENEM <=0.25 SENSITIVE Sensitive     TRIMETH/SULFA <=  20 SENSITIVE Sensitive     AMPICILLIN/SULBACTAM 8 SENSITIVE Sensitive     PIP/TAZO <=4 SENSITIVE Sensitive     * ABUNDANT KLEBSIELLA PNEUMONIAE  MRSA PCR Screening     Status: None   Collection Time: 09/30/19  5:17 PM   Specimen: Nasopharyngeal  Result Value Ref Range Status   MRSA by PCR NEGATIVE NEGATIVE Final    Comment:        The GeneXpert MRSA Assay (FDA approved for NASAL specimens only), is one component of a comprehensive MRSA colonization surveillance program. It is not intended to diagnose MRSA infection nor to guide or monitor treatment for MRSA infections. Performed at [facility], [place] [address], [place], [place] [address]   Body fluid culture     Status: None (Preliminary result)   Collection Time: 10/01/19  4:09 PM   Specimen: Pleural, Right; Body Fluid  Result Value Ref Range Status   Specimen Description   Final    Pleural R Performed at [place] [address] [place], [place] [address]    Special Requests   Final    NONE Performed at [facility], [place] [address] [place], [place] [address]    Gram  Stain   Final    MODERATE WBC PRESENT,BOTH PMN AND MONONUCLEAR NO ORGANISMS SEEN    Culture   Final    NO GROWTH < 24 HOURS Performed at [facility], [place] [address] [place], [place] [address]    Report Status PENDING  Incomplete         Radiology Studies: DG CHEST PORT 1 VIEW  Result Date: 10/02/2019 CLINICAL DATA:  Pleural effusion.  Chest tube. EXAM: PORTABLE CHEST 1 VIEW COMPARISON:  10/01/2019.  CT 09/29/2019 and 09/23/2019. FINDINGS: Right chest tube again noted. Continued improvement of right pleural effusion. Persistent right upper lung mass atelectatic changes. Persistent pulmonary nodules. Reference is made to CT report. Tiny left pleural effusion. No pneumothorax. IMPRESSION: 1. Right chest tube in stable position. Continued improvement of right pleural effusion. No pneumothorax. 2. Persistent right upper lung mass and atelectatic changes. Pulmonary nodules again noted. Small left pleural effusion. Electronically Signed   By: Marcello Moores  Register   On: 10/02/2019 08:35   DG Chest Port 1 View  Result Date: 10/01/2019 CLINICAL DATA:  Status post right chest tube placement. EXAM: PORTABLE CHEST 1 VIEW COMPARISON:  Single-view of the chest 09/30/2019. FINDINGS: New small bore right chest tube is in place. Right pleural effusion is decreased there is improved in aeration in the right chest. No pneumothorax. Very small left pleural effusion noted. Heart size is normal. Atherosclerosis is seen. IMPRESSION: Decreased right pleural effusion and improved aeration in the right chest after chest tube placement. Negative for pneumothorax. No new abnormality. Electronically Signed   By: Inge Rise M.D.   On: 10/01/2019 14:15   DG Chest Port 1 View  Result Date: 09/30/2019 CLINICAL DATA:  History of uterine cancer. EXAM: PORTABLE CHEST 1 VIEW COMPARISON:  CT of 1 day prior.  Chest radiograph of 1 day prior. FINDINGS: Emily Kim rotated right. Normal heart size. Atherosclerosis in the  transverse aorta. No pneumothorax. Near complete whiteout of the right hemithorax, significantly worsened aeration since the radiograph 1 day prior. Left-sided pulmonary nodules. IMPRESSION: Significantly worsened right-sided aeration, with near complete whiteout. Likely a combination of increasing pleural fluid and collapse, superimposed upon underlying mass as detailed on CT. Left-sided pulmonary nodules/metastasis, as before. Aortic Atherosclerosis (ICD10-I70.0). These results will  be called to the ordering clinician or representative by the Radiologist Assistant, and communication documented in the PACS or zVision Dashboard. Electronically Signed   By: Abigail Miyamoto M.D.   On: 09/30/2019 13:55        Scheduled Meds: . Chlorhexidine Gluconate Cloth  6 each Topical Daily  . feeding supplement (ENSURE ENLIVE)  237 mL Oral BID BM  . feeding supplement (PRO-STAT SUGAR FREE 64)  30 mL Oral BID  . guaiFENesin  600 mg Oral BID  . hydrocerin   Topical BID  . mouth rinse  15 mL Mouth Rinse BID  . multivitamin with minerals  1 tablet Oral Daily  . senna-docusate  1 tablet Oral BID  . sodium chloride flush  10-40 mL Intracatheter Q12H  . sodium chloride flush  3 mL Intravenous Q12H   Continuous Infusions: . sodium chloride Stopped (10/01/19 2026)  . cefTRIAXone (ROCEPHIN)  IV Stopped (10/01/19 1617)  . heparin 1,250 Units/hr (10/02/19 0524)     LOS: 9 days    Time spent: 35 mins.More than 50% of that time was spent in counseling and/or coordination of care.      Shelly Coss, MD [facility] Pager [phone]  If 7PM-7AM, please contact night-coverage [email or web address] Password [facility] 10/02/2019, 11:14 AM

## 2019-10-02 NOTE — Progress Notes (Signed)
ANTICOAGULATION CONSULT NOTE - Follow Up Consult  Pharmacy Consult for Heparin Indication: pulmonary embolus  No Known Allergies  Patient Measurements: Height: 5\' 7"  (170.2 cm) Weight: 196 lb 6.9 oz (89.1 kg) IBW/kg (Calculated) : 61.6 Heparin Dosing Weight:   Vital Signs: Temp: 97.5 F (36.4 C) (01/14 0333) Temp Source: Oral (01/14 0333) BP: 114/43 (01/14 0500) Pulse Rate: 90 (01/14 0100)  Labs: Recent Labs    09/29/19 0527 09/29/19 0527 09/30/19 0500 09/30/19 0500 10/01/19 0154 10/02/19 0401  HGB 9.6*   < > 10.4*   < > 9.7* 9.0*  HCT 31.4*   < > 34.5*  --  32.0* 30.2*  PLT 212   < > 238  --  259 199  HEPARINUNFRC  --   --  0.46  --  0.26* 0.21*  CREATININE 0.70  --  0.64  --   --   --    < > = values in this interval not displayed.    Estimated Creatinine Clearance: 66.4 mL/min (by C-G formula based on SCr of 0.64 mg/dL).   Medications:  Infusions:  . sodium chloride Stopped (10/01/19 2026)  . cefTRIAXone (ROCEPHIN)  IV Stopped (10/01/19 1617)  . heparin 1,100 Units/hr (10/02/19 0500)    Assessment: Patient with low heparin level.  No heparin issues per RN.  Goal of Therapy:  Heparin level 0.3-0.7 units/ml Monitor platelets by anticoagulation protocol: Yes   Plan:  Increase heparin to 1250 units/hr Recheck level at [address], [place] 10/02/2019,5:22 AM

## 2019-10-03 ENCOUNTER — Ambulatory Visit
Admit: 2019-10-03 | Discharge: 2019-10-03 | Disposition: A | Discharge status: Home / Self Care | Payer: Medicare Other | Attending: Radiation Oncology | Admitting: Radiation Oncology

## 2019-10-03 LAB — CBC
HCT: 29 % — ABNORMAL LOW (ref 36.0–46.0)
Hemoglobin: 9 g/dL — ABNORMAL LOW (ref 12.0–15.0)
MCH: 26.2 pg (ref 26.0–34.0)
MCHC: 31 g/dL (ref 30.0–36.0)
MCV: 84.5 fL (ref 80.0–100.0)
Platelets: 204 10*3/uL (ref 150–400)
RBC: 3.43 MIL/uL — ABNORMAL LOW (ref 3.87–5.11)
RDW: 16.5 % — ABNORMAL HIGH (ref 11.5–15.5)
WBC: 9.8 10*3/uL (ref 4.0–10.5)
nRBC: 0 % (ref 0.0–0.2)

## 2019-10-03 LAB — HEPARIN LEVEL (UNFRACTIONATED)
Heparin Unfractionated: 0.4 IU/mL (ref 0.30–0.70)
Heparin Unfractionated: 0.59 IU/mL (ref 0.30–0.70)

## 2019-10-03 NOTE — Progress Notes (Signed)
PROGRESS NOTE    Emily Kim  CHY:[id number] DOB: September 19, 1940 DOA: 09/23/2019 PCP: Patient, No Pcp Per   Brief Narrative:  Patient is a 79 year old female with history of uterine cancer status post hysterectomy who presented with shortness of breath, hemoptysis for 2 weeks, weight loss.  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 Started on heparin drip.  Underwent Video bronchoscopy with endobronchial ultrasound for needle aspiration, brushing, endobronchial biopsy.  Biopsy showed non-small cell lung cancer, metastatic.  Being followed by PCCM, radiation oncology, oncology.  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  She also had opacification of the right hemithorax from reaccumulation of pleural effusion.  Underwent  Pleurx cath placement on 10/01/19.  Assessment & Plan:   Principal Problem:   Pulmonary embolism (HCC) Active Problems:   SVC syndrome   Pleural effusion   Adenocarcinoma of right lung, stage 4 (HCC)   Difficult intravenous access   Pulmonary emboli: Presented with dyspnea.  Found to be hypoxic on presentation.  Currently in 2 L of oxygen per minute.  Started on heparin drip.  Pulmonary emboli is  due to metastatic lung cancer.  Metastatic lung cancer with lymphangitic spread, bone metastasis, malignant effusion of the right pleura: Presented with weight loss, shortness of breath.  Underwent diagnostic procedure by cardiothoracic team.  Biopsy showed metastatic non-small cell lung cancer( adenocarcinoma).  Oncology, radiation oncology following. CT chest on 1/11/ showed obstruction of the right upper lobe bronchus and resultant collapse/consolidation of the right upper lobe. Persistent right middle lobe occlusion with associated atelectasis. Multiple bilateral hematogenously  distributed pulmonary nodules and osseous metastases. CT abd/pelvis showed three  scattered indeterminate hypodense liver masses,largest 2.4 cm in segment 2, suspicious for liver metastases. ndeterminate 1.5 cm right adrenal nodule, suspicious for right adrenal metastasis.Widespread sclerotic osseous metastases throughout lumbar spine,sacrum and bilateral pelvic girdle, including in the left femoral neck predisposing to pathologic fracture. Undergoing radiation therapy. Palliative care also following  Recurrent right pleural effusion: S/p thoracentesis and Pleurx catheter placement.Pleural fluid culture sent on 09/29/19 showed showed pansensitive Ecoli and K Pneumoniae.On ceftriaxone.New culture pending.Pleux cath attached to suction.  Severe SVC syndrome: Has swelling of face, severe edema of bilateral upper extremities.  Undergoing palliative radiation therapy.  Imaging showed marked stenosis/occlusion of the superior vena cava and marked narrowing of the right pulmonary artery  Hyponatremia: Stable.  Continue to monitor.  Goals of care: Palliative care consulted.  Patient might be a candidate for hospice.  PT/OT recommended skilled nursing facility.       Nutrition Problem: Increased nutrient needs Etiology: acute illness, cancer and cancer related treatments      DVT prophylaxis: IV heparin Code Status: Full code Family Communication: Talked to Son Emily Kim on 10/01/19 Disposition Plan: SNF after full work up and when clinically stable.  Still on chest tube with suction.   Consultants: PCCM, radiation oncology, oncology  Procedures: Endobronchial biopsy, thoracentesis, Pleurx catheter placement  Antimicrobials:  Anti-infectives (From admission, onward)   Start     Dose/Rate Route Frequency Ordered Stop   10/01/19 1600  cefTRIAXone (ROCEPHIN) 2 g in sodium chloride 0.9 % 100 mL IVPB     2 g 200 mL/hr over 30 Minutes Intravenous Every 24 hours 10/01/19 1441     10/01/19  1130  ceFAZolin (ANCEF) IVPB 2g/100 mL premix  2 g 200 mL/hr over 30 Minutes Intravenous  Once 10/01/19 1033 10/01/19 1258      Subjective: Patient seen and examined at the bedside this morning.  Appears more comfortable today.  Not in respiratory distress.  Cough better.  Denies any new complaints.  Objective: Vitals:   10/03/19 0500 10/03/19 0600 10/03/19 0700 10/03/19 0800  BP:  (!) 128/41  (!) 102/34  Pulse: 87     Resp: 20 20 17 13   Temp:    97.8 F (36.6 C)  TempSrc:    Oral  SpO2: 100%  100% 100%  Weight: 89 kg     Height:        Intake/Output Summary (Last 24 hours) at 10/03/2019 1042 Last data filed at 10/03/2019 0800 Gross per 24 hour  Intake 759.79 ml  Output 1520 ml  Net -760.21 ml   Filed Weights   10/01/19 0436 10/02/19 0459 10/03/19 0500  Weight: 90.9 kg 89.1 kg 89 kg    Examination:  General exam: Chronically ill looking, generalized weakness HEENT:Oral mucosa moist, Ear/Nose normal on gross exam Respiratory system: Bilateral decreased air entry on bases  Right-sided Pleur/chest x catheter attached to the suction Cardiovascular system: S1 & S2 heard, RRR. No JVD, murmurs, rubs, gallops or clicks. No pedal edema. Gastrointestinal system: Abdomen is nondistended, soft and nontender. No organomegaly or masses felt. Normal bowel sounds heard. Central nervous system: Alert and oriented. No focal neurological deficits. Extremities: B/L Upper extremities edema, no clubbing ,no cyanosis Skin: No rashes, lesions or ulcers,no icterus ,no pallor   Data Reviewed: I have personally reviewed following labs and imaging studies  CBC: Recent Labs  Lab 09/29/19 0527 09/30/19 0500 10/01/19 0154 10/02/19 0401 10/03/19 0142  WBC 8.4 10.1 11.2* 9.8 9.8  HGB 9.6* 10.4* 9.7* 9.0* 9.0*  HCT 31.4* 34.5* 32.0* 30.2* 29.0*  MCV 83.3 84.6 84.2 86.0 84.5  PLT 212 238 259 199 409   Basic Metabolic Panel: Recent Labs  Lab 09/27/19 0232 09/28/19 0955 09/28/19 1437  09/29/19 0527 09/30/19 0500  NA 128* 131*  --  132* 131*  K 4.8 5.2* 5.2* 5.2* 4.6  CL 99 102  --  102 100  CO2 20* 22  --  22 22  GLUCOSE 157* 163*  --  113* 123*  BUN 33* 27*  --  20 17  CREATININE 0.91 0.80  --  0.70 0.64  CALCIUM 9.1 8.9  --  8.9 8.7*  MG 2.3 2.0  --  1.8 1.8  PHOS 2.8 2.4*  --  3.0  --    GFR: Estimated Creatinine Clearance: 66.4 mL/min (by C-G formula based on SCr of 0.64 mg/dL). Liver Function Tests: Recent Labs  Lab 09/27/19 0232 09/28/19 0955 09/30/19 0500  AST 26 25 22   ALT 15 14 15   ALKPHOS 146* 141* 123  BILITOT 0.7 0.7 0.7  PROT 6.4* 6.1* 5.0*  ALBUMIN 2.9* 2.5* 2.2*   No results for input(s): LIPASE, AMYLASE in the last 168 hours. No results for input(s): AMMONIA in the last 168 hours. Coagulation Profile: No results for input(s): INR, PROTIME in the last 168 hours. Cardiac Enzymes: No results for input(s): CKTOTAL, CKMB, CKMBINDEX, TROPONINI in the last 168 hours. BNP (last 3 results) No results for input(s): PROBNP in the last 8760 hours. HbA1C: No results for input(s): HGBA1C in the last 72 hours. CBG: No results for input(s): GLUCAP in the last 168 hours. Lipid Profile: No results for input(s): CHOL, HDL, LDLCALC, TRIG, CHOLHDL, LDLDIRECT in  the last 72 hours. Thyroid Function Tests: No results for input(s): TSH, T4TOTAL, FREET4, T3FREE, THYROIDAB in the last 72 hours. Anemia Panel: No results for input(s): VITAMINB12, FOLATE, FERRITIN, TIBC, IRON, RETICCTPCT in the last 72 hours. Sepsis Labs: Recent Labs  Lab 09/30/19 0500  LATICACIDVEN 1.4    Recent Results (from the past 240 hour(s))  SARS CORONAVIRUS 2 (TAT 6-24 HRS) Nasopharyngeal Nasopharyngeal Swab     Status: None   Collection Time: 09/23/19  2:51 PM   Specimen: Nasopharyngeal Swab  Result Value Ref Range Status   SARS Coronavirus 2 NEGATIVE NEGATIVE Final    Comment: (NOTE) SARS-CoV-2 target nucleic acids are NOT DETECTED. The SARS-CoV-2 RNA is generally  detectable in upper and lower respiratory specimens during the acute phase of infection. Negative results do not preclude SARS-CoV-2 infection, do not rule out co-infections with other pathogens, and should not be used as the sole basis for treatment or other patient management decisions. Negative results must be combined with clinical observations, patient history, and epidemiological information. The expected result is Negative. Fact Sheet for Patients: SugarRoll.be Fact Sheet for Healthcare Providers: https://www.woods-mathews.com/ This test is not yet approved or cleared by the [place] FDA and  has been authorized for detection and/or diagnosis of SARS-CoV-2 by FDA under an Emergency Use Authorization (EUA). This EUA will remain  in effect (meaning this test can be used) for the duration of the COVID-19 declaration under Section 56 4(b)(1) of the Act, 21 U.S.C. section 360bbb-3(b)(1), unless the authorization is terminated or revoked sooner. Performed at [facility], [place] [address] [place], [place] [address]   Body fluid culture     Status: None   Collection Time: 09/29/19 12:50 PM   Specimen: PATH Cytology Pleural fluid  Result Value Ref Range Status   Specimen Description   Final    PLEURAL Performed at [place] [address] [place], [place] [address]    Special Requests   Final    NONE Performed at [facility], [place] [address], [place], [place] [address]    Gram Stain   Final    RARE WBC PRESENT,BOTH PMN AND MONONUCLEAR RARE GRAM NEGATIVE RODS Performed at [facility], [place] [address] [place], [place] [address]    Culture   Final    ABUNDANT ESCHERICHIA COLI ABUNDANT KLEBSIELLA PNEUMONIAE    Report Status 10/01/2019 FINAL  Final   Organism ID, Bacteria ESCHERICHIA COLI  Final   Organism ID, Bacteria KLEBSIELLA PNEUMONIAE  Final      Susceptibility    Escherichia coli - MIC*    AMPICILLIN 4 SENSITIVE Sensitive     CEFAZOLIN <=4 SENSITIVE Sensitive     CEFEPIME <=0.12 SENSITIVE Sensitive     CEFTAZIDIME <=1 SENSITIVE Sensitive     CEFTRIAXONE <=0.25 SENSITIVE Sensitive     CIPROFLOXACIN >=4 RESISTANT Resistant     GENTAMICIN <=1 SENSITIVE Sensitive     IMIPENEM <=0.25 SENSITIVE Sensitive     TRIMETH/SULFA <=20 SENSITIVE Sensitive     AMPICILLIN/SULBACTAM 4 SENSITIVE Sensitive     PIP/TAZO <=4 SENSITIVE Sensitive     * ABUNDANT ESCHERICHIA COLI   Klebsiella pneumoniae - MIC*    AMPICILLIN >=32 RESISTANT Resistant     CEFAZOLIN <=4 SENSITIVE Sensitive     CEFEPIME <=0.12 SENSITIVE Sensitive     CEFTAZIDIME <=1 SENSITIVE Sensitive     CEFTRIAXONE <=0.25 SENSITIVE Sensitive     CIPROFLOXACIN <=0.25 SENSITIVE Sensitive     GENTAMICIN <=  1 SENSITIVE Sensitive     IMIPENEM <=0.25 SENSITIVE Sensitive     TRIMETH/SULFA <=20 SENSITIVE Sensitive     AMPICILLIN/SULBACTAM 8 SENSITIVE Sensitive     PIP/TAZO <=4 SENSITIVE Sensitive     * ABUNDANT KLEBSIELLA PNEUMONIAE  MRSA PCR Screening     Status: None   Collection Time: 09/30/19  5:17 PM   Specimen: Nasopharyngeal  Result Value Ref Range Status   MRSA by PCR NEGATIVE NEGATIVE Final    Comment:        The GeneXpert MRSA Assay (FDA approved for NASAL specimens only), is one component of a comprehensive MRSA colonization surveillance program. It is not intended to diagnose MRSA infection nor to guide or monitor treatment for MRSA infections. Performed at [facility], [place] [address], [place], [place] [address]   Body fluid culture     Status: None (Preliminary result)   Collection Time: 10/01/19  4:09 PM   Specimen: Pleural, Right; Body Fluid  Result Value Ref Range Status   Specimen Description   Final    Pleural R Performed at [place] [address], [place], [place] [address]    Special Requests   Final    NONE Performed at [facility], [place] [address] [place], [place] [address]    Gram Stain   Final    MODERATE WBC PRESENT,BOTH PMN AND MONONUCLEAR NO ORGANISMS SEEN    Culture   Final    NO GROWTH 2 DAYS Performed at [facility], [place] [address], [place], [place] [address]    Report Status PENDING  Incomplete         Radiology Studies: DG CHEST PORT 1 VIEW  Result Date: 10/02/2019 CLINICAL DATA:  Pleural effusion.  Chest tube. EXAM: PORTABLE CHEST 1 VIEW COMPARISON:  10/01/2019.  CT 09/29/2019 and 09/23/2019. FINDINGS: Right chest tube again noted. Continued improvement of right pleural effusion. Persistent right upper lung mass atelectatic changes. Persistent pulmonary nodules. Reference is made to CT report. Tiny left pleural effusion. No pneumothorax. IMPRESSION: 1. Right chest tube in stable position. Continued improvement of right pleural effusion. No pneumothorax. 2. Persistent right upper lung mass and atelectatic changes. Pulmonary nodules again noted. Small left pleural effusion. Electronically Signed   By: Marcello Moores  Register   On: 10/02/2019 08:35   DG Chest Port 1 View  Result Date: 10/01/2019 CLINICAL DATA:  Status post right chest tube placement. EXAM: PORTABLE CHEST 1 VIEW COMPARISON:  Single-view of the chest 09/30/2019. FINDINGS: New small bore right chest tube is in place. Right pleural effusion is decreased there is improved in aeration in the right chest. No pneumothorax. Very small left pleural effusion noted. Heart size is normal. Atherosclerosis is seen. IMPRESSION: Decreased right pleural effusion and improved aeration in the right chest after chest tube placement. Negative for pneumothorax. No new abnormality. Electronically Signed   By: Inge Rise M.D.   On: 10/01/2019 14:15        Scheduled Meds: . Chlorhexidine Gluconate Cloth  6 each Topical Daily  . feeding supplement (ENSURE ENLIVE)  237 mL Oral BID BM  . feeding supplement (PRO-STAT SUGAR FREE 64)   30 mL Oral BID  . guaiFENesin  600 mg Oral BID  . hydrocerin   Topical BID  . mouth rinse  15 mL Mouth Rinse BID  . multivitamin with minerals  1 tablet Oral Daily  . senna-docusate  1 tablet Oral BID  . sodium chloride flush  10-40 mL Intracatheter Q12H  . sodium chloride flush  3 mL Intravenous Q12H   Continuous Infusions: . sodium chloride 10 mL/hr at 10/03/19 0800  . cefTRIAXone (ROCEPHIN)  IV Stopped (10/02/19 1711)  . heparin 1,250 Units/hr (10/03/19 0800)     LOS: 10 days    Time spent: 35 mins.More than 50% of that time was spent in counseling and/or coordination of care.      Shelly Coss, MD [facility] Pager [phone]  If 7PM-7AM, please contact night-coverage [email or web address] Password [facility] 10/03/2019, 10:42 AM

## 2019-10-03 NOTE — Consult Note (Signed)
Responded to consult to create advanced directive. Consulted with staff. Chaplain will check back Monday when witnesses are available with notary.  Rev. Eloise Levels Chaplain

## 2019-10-03 NOTE — Progress Notes (Signed)
   NAME:  Emily Kim, MRN:  [id number], DOB:  1940-10-20, LOS: 59 ADMISSION DATE:  09/23/2019, CONSULTATION DATE: 09/29/2019 REFERRING MD: Berle Mull, CHIEF COMPLAINT: Metastatic lung cancer  Brief History   79 year old lady admitted with shortness of breath, hemoptysis Has had 50 pound weight loss over the last 3 months intermittent hemoptysis over 2 weeks Initial evaluation did reveal lung mass, pleural effusion Underwent bronchoscopy with biopsy-adenocarcinoma-disease is widely metastatic Oncology on board Post Pleurx catheter placement 10/01/2019  Past Medical History   Past Medical History:  Diagnosis Date  . Uterine cancer ([place])      Significant Hospital Events   Had bronchoscopy with endobronchial ultrasound on 1/ 6 Right sided Pleurx placed 1/13 for malignant pleural effusion  Consults:  Oncology Cardiothoracic surgery  Procedures:  Bronchoscopy 1/7 1/13 Pleurx catheter placement  Significant Diagnostic Tests:  Surgical pathology 1/7-adenocarcinoma Cytology 1/7-non-small cell lung cancer CT scan of the chest significant for pulmonary emboli Pleural fluid cytology positive  Micro Data:  Pleural fluid culture 1/11 Pleural fluid revealed Klebsiella and E. Coli Pleural fluid culture 1/13-no organism  Antimicrobials:  Cefazolin 1/7 Rocephin 1/13>> Interim history/subjective:   Feels well denies pain. SOB improved  Objective   Blood pressure (Abnormal) 128/41, pulse 87, temperature 97.7 F (36.5 C), temperature source Oral, resp. rate 20, height 5\' 7"  (1.702 m), weight 89 kg, SpO2 100 %.        Intake/Output Summary (Last 24 hours) at 10/03/2019 0631 Last data filed at 10/03/2019 0600 Gross per 24 hour  Intake 753.42 ml  Output 1520 ml  Net -766.58 ml   Filed Weights   10/01/19 0436 10/02/19 0459 10/03/19 0500  Weight: 90.9 kg 89.1 kg 89 kg    Examination: General this is a pleasant 79 year old female. Resting in bed. Looks like she feels better    HENT NCAT still has JVD Pulm clear, decreased on right, no accessory use  Current oxygen needs: 3 liters [place] CT w/ 350 out put last night  Card RRR w/out MRG abd not tender  Ext UE edema pulses palp  Neuro intact. Affect less flat   Resolved Hospital Problem list     Assessment & Plan:   Metastatic adenocarcinoma of the lung with postobstructive atelectasis, multiple metastatic deposits, malignant pleural effusion, evidence of lymphangitic spread of cancer multiple metastases Plan We will continue to drain Pleurx with [place] for now, if follow-up cultures negative we will discontinue, and change to every other day drainage Continue palliative radiation Supplemental oxygen Pulse oximetry Ongoing palliative discussions Would highly recommend DO NOT RESUSCITATE status, unlikely to do well  Rule out right empyema -Culture data surprisingly came back late on 1/13 growing both E. coli and Klebsiella from pleural fluid.  Clinically seems as though she looks much sicker if this were true Plan Have repeated pleural fluid culture on 1/13 to date nothing has grown we will continue to follow Day #3 ceftriaxone Keep chest tube to suction as mentioned above  Pulmonary embolism -Secondary to widely metastatic lung cancer Plan Continue anticoagulation  Superior vena cava syndrome Plan Continue palliative radiation therapy   All other issues per internal medicine service  Erick Colace ACNP-BC [place] Pager # [phone] OR # [phone] if no answer

## 2019-10-03 NOTE — Progress Notes (Signed)
ANTICOAGULATION CONSULT NOTE - Follow Up Consult  Pharmacy Consult for Heparin Indication: pulmonary embolus  No Known Allergies  Patient Measurements: Height: 5\' 7"  (170.2 cm) Weight: 196 lb 6.9 oz (89.1 kg) IBW/kg (Calculated) : 61.6 Heparin Dosing Weight:   Vital Signs: Temp: 97.7 F (36.5 C) (01/15 0357) Temp Source: Oral (01/15 0357) BP: 114/39 (01/15 0200)  Labs: Recent Labs    09/30/19 0500 09/30/19 0500 10/01/19 0154 10/01/19 0154 10/02/19 0401 10/02/19 0401 10/02/19 1501 10/02/19 2259 10/03/19 0142  HGB 10.4*   < > 9.7*   < > 9.0*  --   --   --  9.0*  HCT 34.5*   < > 32.0*  --  30.2*  --   --   --  29.0*  PLT 238   < > 259  --  199  --   --   --  204  HEPARINUNFRC 0.46   < > 0.26*   < > 0.21*   < > 0.44 0.49 0.40  CREATININE 0.64  --   --   --   --   --   --   --   --    < > = values in this interval not displayed.    Estimated Creatinine Clearance: 66.4 mL/min (by C-G formula based on SCr of 0.64 mg/dL).   Medications:  Infusions:  . sodium chloride 10 mL/hr at 10/03/19 0100  . cefTRIAXone (ROCEPHIN)  IV Stopped (10/02/19 1711)  . heparin 1,250 Units/hr (10/03/19 0134)    Assessment: Patient with heparin level at goal.  DHL drawn much earlier than expected but also at goal. No heparin issues noted.  Goal of Therapy:  Heparin level 0.3-0.7 units/ml Monitor platelets by anticoagulation protocol: Yes   Plan:  Continue heparin drip at current rate Recheck level at [place], [place] 10/03/2019,4:27 AM

## 2019-10-03 NOTE — Progress Notes (Signed)
ANTICOAGULATION CONSULT NOTE - Follow Up Consult  Pharmacy Consult for heparin Indication: acute pulmonary embolus  No Known Allergies  Patient Measurements: Height: 5\' 7"  (170.2 cm) Weight: 196 lb 3.4 oz (89 kg) IBW/kg (Calculated) : 61.6 Heparin Dosing Weight: 81 kg  Vital Signs: Temp: 97.8 F (36.6 C) (01/15 0800) Temp Source: Oral (01/15 0800) BP: 102/34 (01/15 0800) Pulse Rate: 87 (01/15 0500)  Labs: Recent Labs    10/01/19 0154 10/01/19 0154 10/02/19 0401 10/02/19 0401 10/02/19 1501 10/02/19 2259 10/03/19 0142  HGB 9.7*   < > 9.0*  --   --   --  9.0*  HCT 32.0*  --  30.2*  --   --   --  29.0*  PLT 259  --  199  --   --   --  204  HEPARINUNFRC 0.26*   < > 0.21*   < > 0.44 0.49 0.40   < > = values in this interval not displayed.    Estimated Creatinine Clearance: 66.4 mL/min (by C-G formula based on SCr of 0.64 mg/dL).   Assessment: Patient is a 79 y.o F presented to the ED on 1/5 with c/o cough and SOB.  She was subsequently diagnosed with stage IV NSCLC and is currently on heparin drip for acute LLL PE.  Today, 10/03/2019: - heparin level is therapeutic at 0.59 -  hgb low but stable at 9, plts ok - no bleeding documented   Goal of Therapy:  Heparin level 0.3-0.7 units/ml Monitor platelets by anticoagulation protocol: Yes   Plan:  - heparin drip at 1250 units/hr - daily heparin level - monitor for s/s bleeding  Lance Galas P 10/03/2019,10:32 AM

## 2019-10-04 LAB — HEPARIN LEVEL (UNFRACTIONATED): Heparin Unfractionated: 0.4 IU/mL (ref 0.30–0.70)

## 2019-10-04 LAB — CBC
HCT: 27.9 % — ABNORMAL LOW (ref 36.0–46.0)
Hemoglobin: 8.4 g/dL — ABNORMAL LOW (ref 12.0–15.0)
MCH: 25.6 pg — ABNORMAL LOW (ref 26.0–34.0)
MCHC: 30.1 g/dL (ref 30.0–36.0)
MCV: 85.1 fL (ref 80.0–100.0)
Platelets: 217 10*3/uL (ref 150–400)
RBC: 3.28 MIL/uL — ABNORMAL LOW (ref 3.87–5.11)
RDW: 16.6 % — ABNORMAL HIGH (ref 11.5–15.5)
WBC: 8.6 10*3/uL (ref 4.0–10.5)
nRBC: 0 % (ref 0.0–0.2)

## 2019-10-04 MED ORDER — DICLOFENAC SODIUM 1 % EX GEL
2.0000 g | Freq: Four times a day (QID) | CUTANEOUS | Status: DC
  Administered 2019-10-04 – 2019-10-10 (×17): 2 g via TOPICAL
  Filled 2019-10-04: qty 100

## 2019-10-04 MED ORDER — ORAL CARE MOUTH RINSE
15.0000 mL | Freq: Two times a day (BID) | OROMUCOSAL | Status: DC
  Administered 2019-10-04 – 2019-10-10 (×12): 15 mL via OROMUCOSAL

## 2019-10-04 NOTE — Progress Notes (Signed)
Daily Progress Note   Patient Name: Emily Kim       Date: 10/04/2019 DOB: 02-26-1941  Age: 79 y.o. MRN#: [id number] Attending Physician: Shelly Coss, MD Primary Care Physician: Patient, No Pcp Per Admit Date: 09/23/2019  Reason for Consultation/Follow-up: Establishing goals of care  Subjective: I met again with Emily Kim today.  We reviewed her clinical course the last several days and continue discussion regarding her goals for her care in the future.  She reports that she continues to feel better and remains hopeful for continued stabilization of function and to be able to follow-up with Dr. Earlie Server as she desires continued aggressive care and to seek any available disease modifying therapy.   Length of Stay: 11  Current Medications: Scheduled Meds:  . Chlorhexidine Gluconate Cloth  6 each Topical Daily  . diclofenac Sodium  2 g Topical QID  . feeding supplement (ENSURE ENLIVE)  237 mL Oral BID BM  . feeding supplement (PRO-STAT SUGAR FREE 64)  30 mL Oral BID  . guaiFENesin  600 mg Oral BID  . hydrocerin   Topical BID  . mouth rinse  15 mL Mouth Rinse BID  . mouth rinse  15 mL Mouth Rinse BID  . multivitamin with minerals  1 tablet Oral Daily  . senna-docusate  1 tablet Oral BID  . sodium chloride flush  10-40 mL Intracatheter Q12H  . sodium chloride flush  3 mL Intravenous Q12H    Continuous Infusions: . sodium chloride Stopped (10/04/19 0442)  . cefTRIAXone (ROCEPHIN)  IV 2 g (10/04/19 1650)  . heparin Stopped (10/04/19 0442)    PRN Meds: sodium chloride, acetaminophen **OR** acetaminophen, guaiFENesin-dextromethorphan, HYDROcodone-acetaminophen, ipratropium-albuterol, ondansetron (ZOFRAN) IV, sodium chloride flush, sodium chloride flush  Physical Exam       General: Alert, awake, in no acute distress.  Chronically ill-appearingHEENT: No bruits, no goiter, no JVD Heart: Regular rate and rhythm. No murmur appreciated. Lungs: Decreased air movement Abdomen: Soft, nontender, nondistended, positive bowel sounds.  Skin: Warm and dry Neuro: Grossly intact, nonfocal.      Vital Signs: BP (!) 114/47   Pulse 88   Temp 97.7 F (36.5 C) (Oral)   Resp 20   Ht 5' 7"  (1.702 m)   Wt 88.4 kg   SpO2 99%   BMI 30.52 kg/m  SpO2: SpO2:  99 % O2 Device: O2 Device: Nasal Cannula O2 Flow Rate: O2 Flow Rate (L/min): 3 L/min  Intake/output summary:   Intake/Output Summary (Last 24 hours) at 10/04/2019 1813 Last data filed at 10/04/2019 1400 Gross per 24 hour  Intake 909.94 ml  Output 410 ml  Net 499.94 ml   LBM: Last BM Date: 10/03/19 Baseline Weight: Weight: 74.8 kg Most recent weight: Weight: 88.4 kg       Palliative Assessment/Data:      Patient Active Problem List   Diagnosis Date Noted  . Difficult intravenous access   . Adenocarcinoma of right lung, stage 4 ([place])   . Pulmonary embolism ([facility]) 09/23/2019  . 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 Pleural effusion 09/23/2019    Palliative Care Assessment & Plan   Patient Profile: 79 year old female with past medical history of uterine cancer status post hysterectomy who presented to the hospital following 50 pound weight loss as well as intermittent hemoptysis and shortness of breath.  She was discovered to have lung mass with pleural effusion and bronchoscopy revealed adenocarcinoma.  Further imaging has revealed significant metastatic disease including SVC syndrome for which she is currently undergoing radiation.  She also has widely metastatic disease throughout her chest as well as liver and multiple sclerotic lesions.  Her course is further complicated by pulmonary emboli.  Palliative consulted for goals of care.  Recommendations/Plan:  Emily Kim remains invested in plan for full scope/full  code treatment.  She reports needing to have the opportunity to follow-up and discuss further with oncology prior to making any further decisions regarding her care plan.  Appreciate spiritual care assistance in working to name her son as her Ambulance person.  We will continue to check in periodically and progress conversation based upon her clinical course and as she is emotionally willing to engage.  Goals of Care and Additional Recommendations:  Limitations on Scope of Treatment: Full Scope Treatment  Code Status:    Code Status Orders  (From admission, onward)         Start     Ordered   09/23/19 2207  Full code  Continuous     09/23/19 2210        Code Status History    This patient has a current code status but no historical code status.   Advance Care Planning Activity       Prognosis:   Unable to determine  Discharge Planning:  To Be Determined  Care plan was discussed with patient  Thank you for allowing the Palliative Medicine Team to assist in the care of this patient.   Time In: 1600 Time Out: 1630 Total Time 30 Prolonged Time Billed No      Greater than 50%  of this time was spent counseling and coordinating care related to the above assessment and plan.  [name], MD  Please contact Palliative Medicine Team phone at [phone] for questions and concerns.

## 2019-10-04 NOTE — Progress Notes (Signed)
ANTICOAGULATION CONSULT NOTE - Follow Up Consult  Pharmacy Consult for heparin Indication: acute pulmonary embolus  No Known Allergies  Patient Measurements: Height: 5\' 7"  (170.2 cm) Weight: 194 lb 14.2 oz (88.4 kg) IBW/kg (Calculated) : 61.6 Heparin Dosing Weight: 81 kg  Vital Signs: Temp: 97.6 F (36.4 C) (01/16 0700) Temp Source: Oral (01/16 0700) BP: 119/38 (01/16 0400) Pulse Rate: 85 (01/16 0500)  Labs: Recent Labs    10/02/19 0401 10/02/19 1501 10/03/19 0142 10/03/19 1026 10/04/19 0105  HGB 9.0*  --  9.0*  --  8.4*  HCT 30.2*  --  29.0*  --  27.9*  PLT 199  --  204  --  217  HEPARINUNFRC 0.21*   < > 0.40 0.59 0.40   < > = values in this interval not displayed.    Estimated Creatinine Clearance: 66.1 mL/min (by C-G formula based on SCr of 0.64 mg/dL).   Assessment: Patient is a 79 y.o F presented to the ED on 1/5 with c/o cough and SOB.  She was subsequently diagnosed with stage IV NSCLC and is currently on heparin drip for acute LLL PE.  Today, 10/04/2019: - heparin level is therapeutic at 0.40 -  hgb down slightly 8.4, plts ok - no bleeding documented   Goal of Therapy:  Heparin level 0.3-0.7 units/ml Monitor platelets by anticoagulation protocol: Yes   Plan:  - continue heparin drip at 1250 units/hr - daily heparin level - monitor for s/s bleeding  Niza Soderholm P 10/04/2019,9:18 AM

## 2019-10-04 NOTE — Progress Notes (Signed)
PROGRESS NOTE    Emily Kim  CWU:[id number] DOB: 29-Jan-1941 DOA: 09/23/2019 PCP: Patient, No Pcp Per   Brief Narrative:  Patient is a 79 year old female with history of uterine cancer status post hysterectomy who presented with shortness of breath, hemoptysis for 2 weeks, weight loss.  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 Started on heparin drip.  Underwent Video bronchoscopy with endobronchial ultrasound for needle aspiration, brushing, endobronchial biopsy.  Biopsy showed non-small cell lung cancer, metastatic.  Being followed by PCCM, radiation oncology, oncology.  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  She also had opacification of the right hemithorax from reaccumulation of pleural effusion.  Underwent  Pleurx cath placement on 10/01/19.  Assessment & Plan:   Principal Problem:   Pulmonary embolism ([facility]) Active Problems:   SVC syndrome   Pleural effusion   Adenocarcinoma of right lung, stage 4 ([facility])   Difficult intravenous access   Pulmonary emboli: Presented with dyspnea.  Found to be hypoxic on presentation.  Currently in 2 L of oxygen per minute.  Started on heparin drip.  Pulmonary emboli is  due to metastatic lung cancer.  Metastatic lung cancer with lymphangitic spread, bone metastasis, malignant effusion of the right pleura: Presented with weight loss, shortness of breath.  Underwent diagnostic procedure by cardiothoracic team.  Biopsy showed metastatic non-small cell lung cancer( adenocarcinoma).  Oncology, radiation oncology following. CT chest on 1/11/ showed obstruction of the right upper lobe bronchus and resultant collapse/consolidation of the right upper lobe. Persistent right middle lobe occlusion with associated atelectasis. Multiple bilateral hematogenously  distributed pulmonary nodules and osseous metastases. CT abd/pelvis showed three  scattered indeterminate hypodense liver masses,largest 2.4 cm in segment 2, suspicious for liver metastases. ndeterminate 1.5 cm right adrenal nodule, suspicious for right adrenal metastasis.Widespread sclerotic osseous metastases throughout lumbar spine,sacrum and bilateral pelvic girdle, including in the left femoral neck predisposing to pathologic fracture. Undergoing radiation therapy. Palliative care also following  Recurrent right pleural effusion: S/p thoracentesis and Pleurx catheter placement.Pleural fluid culture sent on 09/29/19 showed showed pansensitive Ecoli and K Pneumoniae.On ceftriaxone.New culture pending,NGTD.Pleux cath attached to suction.  Severe SVC syndrome: Has swelling of face, severe edema of bilateral upper extremities.  Undergoing palliative radiation therapy.  Imaging showed marked stenosis/occlusion of the superior vena cava and marked narrowing of the right pulmonary artery  Hyponatremia: Stable.  Continue to monitor.  Goals of care: Palliative care consulted.  Patient might be a candidate for hospice.  PT/OT recommended skilled nursing facility.       Nutrition Problem: Increased nutrient needs Etiology: acute illness, cancer and cancer related treatments      DVT prophylaxis: IV heparin Code Status: Full code Family Communication: Talked to Son Emily Kim on 10/01/19 Disposition Plan: SNF after full work up and when clinically stable.  Still on chest tube with suction.   Consultants: PCCM, radiation oncology, oncology  Procedures: Endobronchial biopsy, thoracentesis, Pleurx catheter placement  Antimicrobials:  Anti-infectives (From admission, onward)   Start     Dose/Rate Route Frequency Ordered Stop   10/01/19 1600  cefTRIAXone (ROCEPHIN) 2 g in sodium chloride 0.9 % 100 mL IVPB     2 g 200 mL/hr over 30 Minutes Intravenous Every 24 hours 10/01/19 1441      10/01/19 1130  ceFAZolin (ANCEF) IVPB 2g/100 mL premix  2 g 200 mL/hr over 30 Minutes Intravenous  Once 10/01/19 1033 10/01/19 1258      Subjective:  Patient seen and examined the bedside this morning.  Hemodynamically stable.  Comfortable.  Feels better today.  Denies any shortness of breath.  Still having some cough.  Objective: Vitals:   10/04/19 0800 10/04/19 0900 10/04/19 1000 10/04/19 1100  BP: 116/73  (!) 113/58   Pulse:  91 91   Resp: 14 16 (!) 24 14  Temp:      TempSrc:      SpO2:  100% 100%   Weight:      Height:        Intake/Output Summary (Last 24 hours) at 10/04/2019 1223 Last data filed at 10/04/2019 0600 Gross per 24 hour  Intake 956.97 ml  Output 1180 ml  Net -223.03 ml   Filed Weights   10/02/19 0459 10/03/19 0500 10/04/19 0500  Weight: 89.1 kg 89 kg 88.4 kg    Examination:    General exam: Chronically ill looking, generalized weakness HEENT:Oral mucosa moist, Ear/Nose normal on gross exam Respiratory system: Bilateral decreased air entry in the bases, few crackles on the bases.  Right-sided chest tube attached to suction Cardiovascular system: S1 & S2 heard, RRR. No JVD, murmurs, rubs, gallops or clicks. Gastrointestinal system: Abdomen is nondistended, soft and nontender. No organomegaly or masses felt. Normal bowel sounds heard. Central nervous system: Alert and oriented. No focal neurological deficits. Extremities: Swelling of the bilateral upper extremities, no lower extremity edema. Skin: No rashes, lesions or ulcers,no icterus ,no pallor    Data Reviewed: I have personally reviewed following labs and imaging studies  CBC: Recent Labs  Lab 09/30/19 0500 10/01/19 0154 10/02/19 0401 10/03/19 0142 10/04/19 0105  WBC 10.1 11.2* 9.8 9.8 8.6  HGB 10.4* 9.7* 9.0* 9.0* 8.4*  HCT 34.5* 32.0* 30.2* 29.0* 27.9*  MCV 84.6 84.2 86.0 84.5 85.1  PLT 238 259 199 204 093   Basic Metabolic Panel: Recent Labs  Lab 09/28/19 0955 09/28/19 1437  09/29/19 0527 09/30/19 0500  NA 131*  --  132* 131*  K 5.2* 5.2* 5.2* 4.6  CL 102  --  102 100  CO2 22  --  22 22  GLUCOSE 163*  --  113* 123*  BUN 27*  --  20 17  CREATININE 0.80  --  0.70 0.64  CALCIUM 8.9  --  8.9 8.7*  MG 2.0  --  1.8 1.8  PHOS 2.4*  --  3.0  --    GFR: Estimated Creatinine Clearance: 66.1 mL/min (by C-G formula based on SCr of 0.64 mg/dL). Liver Function Tests: Recent Labs  Lab 09/28/19 0955 09/30/19 0500  AST 25 22  ALT 14 15  ALKPHOS 141* 123  BILITOT 0.7 0.7  PROT 6.1* 5.0*  ALBUMIN 2.5* 2.2*   No results for input(s): LIPASE, AMYLASE in the last 168 hours. No results for input(s): AMMONIA in the last 168 hours. Coagulation Profile: No results for input(s): INR, PROTIME in the last 168 hours. Cardiac Enzymes: No results for input(s): CKTOTAL, CKMB, CKMBINDEX, TROPONINI in the last 168 hours. BNP (last 3 results) No results for input(s): PROBNP in the last 8760 hours. HbA1C: No results for input(s): HGBA1C in the last 72 hours. CBG: No results for input(s): GLUCAP in the last 168 hours. Lipid Profile: No results for input(s): CHOL, HDL, LDLCALC, TRIG, CHOLHDL, LDLDIRECT in the last 72 hours. Thyroid Function Tests: No results for input(s): TSH, T4TOTAL, FREET4, T3FREE, THYROIDAB  in the last 72 hours. Anemia Panel: No results for input(s): VITAMINB12, FOLATE, FERRITIN, TIBC, IRON, RETICCTPCT in the last 72 hours. Sepsis Labs: Recent Labs  Lab 09/30/19 0500  LATICACIDVEN 1.4    Recent Results (from the past 240 hour(s))  Body fluid culture     Status: None   Collection Time: 09/29/19 12:50 PM   Specimen: PATH Cytology Pleural fluid  Result Value Ref Range Status   Specimen Description   Final    PLEURAL Performed at [facility] [address] [place], [place] [address]    Special Requests   Final    NONE Performed at [facility], [place] [address] [place], [place] [address]    Gram Stain    Final    RARE WBC PRESENT,BOTH PMN AND MONONUCLEAR RARE GRAM NEGATIVE RODS Performed at [facility], [place] [address], [place], [place] [address]    Culture   Final    ABUNDANT ESCHERICHIA COLI ABUNDANT KLEBSIELLA PNEUMONIAE    Report Status 10/01/2019 FINAL  Final   Organism ID, Bacteria ESCHERICHIA COLI  Final   Organism ID, Bacteria KLEBSIELLA PNEUMONIAE  Final      Susceptibility   Escherichia coli - MIC*    AMPICILLIN 4 SENSITIVE Sensitive     CEFAZOLIN <=4 SENSITIVE Sensitive     CEFEPIME <=0.12 SENSITIVE Sensitive     CEFTAZIDIME <=1 SENSITIVE Sensitive     CEFTRIAXONE <=0.25 SENSITIVE Sensitive     CIPROFLOXACIN >=4 RESISTANT Resistant     GENTAMICIN <=1 SENSITIVE Sensitive     IMIPENEM <=0.25 SENSITIVE Sensitive     TRIMETH/SULFA <=20 SENSITIVE Sensitive     AMPICILLIN/SULBACTAM 4 SENSITIVE Sensitive     PIP/TAZO <=4 SENSITIVE Sensitive     * ABUNDANT ESCHERICHIA COLI   Klebsiella pneumoniae - MIC*    AMPICILLIN >=32 RESISTANT Resistant     CEFAZOLIN <=4 SENSITIVE Sensitive     CEFEPIME <=0.12 SENSITIVE Sensitive     CEFTAZIDIME <=1 SENSITIVE Sensitive     CEFTRIAXONE <=0.25 SENSITIVE Sensitive     CIPROFLOXACIN <=0.25 SENSITIVE Sensitive     GENTAMICIN <=1 SENSITIVE Sensitive     IMIPENEM <=0.25 SENSITIVE Sensitive     TRIMETH/SULFA <=20 SENSITIVE Sensitive     AMPICILLIN/SULBACTAM 8 SENSITIVE Sensitive     PIP/TAZO <=4 SENSITIVE Sensitive     * ABUNDANT KLEBSIELLA PNEUMONIAE  MRSA PCR Screening     Status: None   Collection Time: 09/30/19  5:17 PM   Specimen: Nasopharyngeal  Result Value Ref Range Status   MRSA by PCR NEGATIVE NEGATIVE Final    Comment:        The GeneXpert MRSA Assay (FDA approved for NASAL specimens only), is one component of a comprehensive MRSA colonization surveillance program. It is not intended to diagnose MRSA infection nor to guide or monitor treatment for MRSA infections. Performed at [facility], [place] [address], [place], [place] [address]   Body fluid culture     Status: None (Preliminary result)   Collection Time: 10/01/19  4:09 PM   Specimen: Pleural, Right; Body Fluid  Result Value Ref Range Status   Specimen Description   Final    Pleural R Performed at [facility] [address] [place], [place] [address]    Special Requests   Final    NONE Performed at [facility], [place] [address], [place], [place] [address]    Gram Stain   Final  MODERATE WBC PRESENT,BOTH PMN AND MONONUCLEAR NO ORGANISMS SEEN    Culture   Final    NO GROWTH 3 DAYS Performed at [facility], [place] [address] [place], [place] [address]    Report Status PENDING  Incomplete         Radiology Studies: No results found.      Scheduled Meds: . Chlorhexidine Gluconate Cloth  6 each Topical Daily  . diclofenac Sodium  2 g Topical QID  . feeding supplement (ENSURE ENLIVE)  237 mL Oral BID BM  . feeding supplement (PRO-STAT SUGAR FREE 64)  30 mL Oral BID  . guaiFENesin  600 mg Oral BID  . hydrocerin   Topical BID  . mouth rinse  15 mL Mouth Rinse BID  . mouth rinse  15 mL Mouth Rinse BID  . multivitamin with minerals  1 tablet Oral Daily  . senna-docusate  1 tablet Oral BID  . sodium chloride flush  10-40 mL Intracatheter Q12H  . sodium chloride flush  3 mL Intravenous Q12H   Continuous Infusions: . sodium chloride Stopped (10/04/19 0442)  . cefTRIAXone (ROCEPHIN)  IV Stopped (10/03/19 1736)  . heparin Stopped (10/04/19 0442)     LOS: 11 days    Time spent: 35 mins.More than 50% of that time was spent in counseling and/or coordination of care.      Shelly Coss, MD [facility] Pager [phone]  If 7PM-7AM, please contact night-coverage [email or web address] Password [facility] 10/04/2019, 12:23 PM

## 2019-10-05 ENCOUNTER — Inpatient Hospital Stay (HOSPITAL_COMMUNITY): Payer: Medicare Other

## 2019-10-05 LAB — BASIC METABOLIC PANEL
Anion gap: 7 (ref 5–15)
BUN: 20 mg/dL (ref 8–23)
CO2: 28 mmol/L (ref 22–32)
Calcium: 8.6 mg/dL — ABNORMAL LOW (ref 8.9–10.3)
Chloride: 94 mmol/L — ABNORMAL LOW (ref 98–111)
Creatinine, Ser: 0.55 mg/dL (ref 0.44–1.00)
GFR calc Af Amer: >60 mL/min (ref >60)
GFR calc non Af Amer: >60 mL/min (ref >60)
Glucose, Bld: 105 mg/dL — ABNORMAL HIGH (ref 70–99)
Potassium: 4.5 mmol/L (ref 3.5–5.1)
Sodium: 129 mmol/L — ABNORMAL LOW (ref 135–145)

## 2019-10-05 LAB — BODY FLUID CULTURE: Culture: NO GROWTH

## 2019-10-05 LAB — HEPARIN LEVEL (UNFRACTIONATED): Heparin Unfractionated: 0.44 IU/mL (ref 0.30–0.70)

## 2019-10-05 NOTE — Progress Notes (Signed)
PROGRESS NOTE    Emily Kim  BPZ:[id number] DOB: 1941/04/16 DOA: 09/23/2019 PCP: Patient, No Pcp Per   Brief Narrative:  Patient is a 79 year old female with history of uterine cancer status post hysterectomy who presented with shortness of breath, hemoptysis for 2 weeks, weight loss.  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 Started on heparin drip.  Underwent Video bronchoscopy with endobronchial ultrasound for needle aspiration, brushing, endobronchial biopsy.  Biopsy showed non-small cell lung cancer, metastatic.  Being followed by PCCM, radiation oncology, oncology.  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  She also had opacification of the right hemithorax from reaccumulation of pleural effusion.  Underwent  Pleurx cath placement on 10/01/19.  Assessment & Plan:   Principal Problem:   Pulmonary embolism (HCC) Active Problems:   SVC syndrome   Pleural effusion   Adenocarcinoma of right lung, stage 4 (HCC)   Difficult intravenous access   Pulmonary emboli: Presented with dyspnea.  Found to be hypoxic on presentation.  Currently in 2-3 L of oxygen per minute.  Started on heparin drip.  Pulmonary emboli is  due to metastatic lung cancer.  Metastatic lung cancer with lymphangitic spread, bone metastasis, malignant effusion of the right pleura: Presented with weight loss, shortness of breath.  Underwent diagnostic procedure by cardiothoracic team.  Biopsy showed metastatic non-small cell lung cancer( adenocarcinoma).  Oncology, radiation oncology following. CT chest on 1/11/ showed obstruction of the right upper lobe bronchus and resultant collapse/consolidation of the right upper lobe. Persistent right middle lobe occlusion with associated atelectasis. Multiple bilateral hematogenously  distributed pulmonary nodules and osseous metastases. CT abd/pelvis showed three  scattered indeterminate hypodense liver masses,largest 2.4 cm in segment 2, suspicious for liver metastases. ndeterminate 1.5 cm right adrenal nodule, suspicious for right adrenal metastasis.Widespread sclerotic osseous metastases throughout lumbar spine,sacrum and bilateral pelvic girdle, including in the left femoral neck predisposing to pathologic fracture. Undergoing radiation therapy. Palliative care also following  Recurrent right pleural effusion: S/p thoracentesis and Pleurx catheter placement.Pleural fluid culture sent on 09/29/19 showed showed pansensitive Ecoli and K Pneumoniae.On ceftriaxone.New culture pending,NGTD.Pleux cath attached to suction.  Severe SVC syndrome: Has swelling of face, severe edema of bilateral upper extremities.  Undergoing palliative radiation therapy.  Imaging showed marked stenosis/occlusion of the superior vena cava and marked narrowing of the right pulmonary artery  Hyponatremia: Stable.  Continue to monitor.  Goals of care: Palliative care consulted.  Patient might be a candidate for hospice.  PT/OT recommended skilled nursing facility.       Nutrition Problem: Increased nutrient needs Etiology: acute illness, cancer and cancer related treatments      DVT prophylaxis: IV heparin Code Status: Full code Family Communication: Talked to Son Syndi Pua on 10/01/19 Disposition Plan: SNF after full work up and when clinically stable.  Still on chest tube with suction.   Consultants: PCCM, radiation oncology, oncology  Procedures: Endobronchial biopsy, thoracentesis, Pleurx catheter placement  Antimicrobials:  Anti-infectives (From admission, onward)   Start     Dose/Rate Route Frequency Ordered Stop   10/01/19 1600  cefTRIAXone (ROCEPHIN) 2 g in sodium chloride 0.9 % 100 mL IVPB     2 g 200 mL/hr over 30 Minutes Intravenous Every 24 hours 10/01/19 1441      10/01/19 1130  ceFAZolin (ANCEF) IVPB 2g/100 mL premix  2 g 200 mL/hr over 30 Minutes Intravenous  Once 10/01/19 1033 10/01/19 1258      Subjective:  Patient seen and examined the bedside this morning.  Feels the same as yesterday.  No new changes.  She has some oozing from her right edematous forearm.  Does not look in any kind of respiratory distress.  No shortness of breath or cough today.  Objective: Vitals:   10/05/19 0400 10/05/19 0500 10/05/19 0600 10/05/19 0800  BP: (!) 148/41  (!) 154/46 (!) 153/55  Pulse:  86 84 86  Resp: 20 15 20 20   Temp: 97.7 F (36.5 C)     TempSrc: Oral     SpO2: 100% 100% 100% 100%  Weight:  90 kg    Height:        Intake/Output Summary (Last 24 hours) at 10/05/2019 1028 Last data filed at 10/05/2019 0800 Gross per 24 hour  Intake 606.77 ml  Output 900 ml  Net -293.23 ml   Filed Weights   10/03/19 0500 10/04/19 0500 10/05/19 0500  Weight: 89 kg 88.4 kg 90 kg    Examination:   General exam: Not in distress, obese, chronically ill looking. HEENT:Oral mucosa moist, Ear/Nose normal on gross exam Respiratory system: Bilateral decreased air entry on the bases, right-sided chest tube attached to the suction Cardiovascular system: S1 & S2 heard, RRR. No JVD, murmurs, rubs, gallops or clicks. Gastrointestinal system: Abdomen is nondistended, soft and nontender. No organomegaly or masses felt. Normal bowel sounds heard. Central nervous system: Alert and oriented. No focal neurological deficits. Extremities: Swelling of bilateral upper extremities, some oozing from right forearm no clubbing ,no cyanosis Skin: No rashes, lesions or ulcers,no icterus ,no pallor    Data Reviewed: I have personally reviewed following labs and imaging studies  CBC: Recent Labs  Lab 09/30/19 0500 10/01/19 0154 10/02/19 0401 10/03/19 0142 10/04/19 0105  WBC 10.1 11.2* 9.8 9.8 8.6  HGB 10.4* 9.7* 9.0* 9.0* 8.4*  HCT 34.5* 32.0* 30.2* 29.0* 27.9*  MCV 84.6  84.2 86.0 84.5 85.1  PLT 238 259 199 204 073   Basic Metabolic Panel: Recent Labs  Lab 09/28/19 1437 09/29/19 0527 09/30/19 0500 10/05/19 0146  NA  --  132* 131* 129*  K 5.2* 5.2* 4.6 4.5  CL  --  102 100 94*  CO2  --  22 22 28   GLUCOSE  --  113* 123* 105*  BUN  --  20 17 20   CREATININE  --  0.70 0.64 0.55  CALCIUM  --  8.9 8.7* 8.6*  MG  --  1.8 1.8  --   PHOS  --  3.0  --   --    GFR: Estimated Creatinine Clearance: 66.8 mL/min (by C-G formula based on SCr of 0.55 mg/dL). Liver Function Tests: Recent Labs  Lab 09/30/19 0500  AST 22  ALT 15  ALKPHOS 123  BILITOT 0.7  PROT 5.0*  ALBUMIN 2.2*   No results for input(s): LIPASE, AMYLASE in the last 168 hours. No results for input(s): AMMONIA in the last 168 hours. Coagulation Profile: No results for input(s): INR, PROTIME in the last 168 hours. Cardiac Enzymes: No results for input(s): CKTOTAL, CKMB, CKMBINDEX, TROPONINI in the last 168 hours. BNP (last 3 results) No results for input(s): PROBNP in the last 8760 hours. HbA1C: No results for input(s): HGBA1C in the last 72 hours. CBG: No results for input(s): GLUCAP in the last 168 hours. Lipid Profile: No results for input(s): CHOL, HDL, LDLCALC, TRIG,  CHOLHDL, LDLDIRECT in the last 72 hours. Thyroid Function Tests: No results for input(s): TSH, T4TOTAL, FREET4, T3FREE, THYROIDAB in the last 72 hours. Anemia Panel: No results for input(s): VITAMINB12, FOLATE, FERRITIN, TIBC, IRON, RETICCTPCT in the last 72 hours. Sepsis Labs: Recent Labs  Lab 09/30/19 0500  LATICACIDVEN 1.4    Recent Results (from the past 240 hour(s))  Body fluid culture     Status: None   Collection Time: 09/29/19 12:50 PM   Specimen: PATH Cytology Pleural fluid  Result Value Ref Range Status   Specimen Description   Final    PLEURAL Performed at [facility] [address], [place], [place] [address]    Special Requests   Final    NONE Performed at [facility] [place] [address], [place], [place] [address]    Gram Stain   Final    RARE WBC PRESENT,BOTH PMN AND MONONUCLEAR RARE GRAM NEGATIVE RODS Performed at [facility], [place] [address], [place], [place] [address]    Culture   Final    ABUNDANT ESCHERICHIA COLI ABUNDANT KLEBSIELLA PNEUMONIAE    Report Status 10/01/2019 FINAL  Final   Organism ID, Bacteria ESCHERICHIA COLI  Final   Organism ID, Bacteria KLEBSIELLA PNEUMONIAE  Final      Susceptibility   Escherichia coli - MIC*    AMPICILLIN 4 SENSITIVE Sensitive     CEFAZOLIN <=4 SENSITIVE Sensitive     CEFEPIME <=0.12 SENSITIVE Sensitive     CEFTAZIDIME <=1 SENSITIVE Sensitive     CEFTRIAXONE <=0.25 SENSITIVE Sensitive     CIPROFLOXACIN >=4 RESISTANT Resistant     GENTAMICIN <=1 SENSITIVE Sensitive     IMIPENEM <=0.25 SENSITIVE Sensitive     TRIMETH/SULFA <=20 SENSITIVE Sensitive     AMPICILLIN/SULBACTAM 4 SENSITIVE Sensitive     PIP/TAZO <=4 SENSITIVE Sensitive     * ABUNDANT ESCHERICHIA COLI   Klebsiella pneumoniae - MIC*    AMPICILLIN >=32 RESISTANT Resistant     CEFAZOLIN <=4 SENSITIVE Sensitive     CEFEPIME <=0.12 SENSITIVE Sensitive     CEFTAZIDIME <=1 SENSITIVE Sensitive     CEFTRIAXONE <=0.25 SENSITIVE Sensitive     CIPROFLOXACIN <=0.25 SENSITIVE Sensitive     GENTAMICIN <=1 SENSITIVE Sensitive     IMIPENEM <=0.25 SENSITIVE Sensitive     TRIMETH/SULFA <=20 SENSITIVE Sensitive     AMPICILLIN/SULBACTAM 8 SENSITIVE Sensitive     PIP/TAZO <=4 SENSITIVE Sensitive     * ABUNDANT KLEBSIELLA PNEUMONIAE  MRSA PCR Screening     Status: None   Collection Time: 09/30/19  5:17 PM   Specimen: Nasopharyngeal  Result Value Ref Range Status   MRSA by PCR NEGATIVE NEGATIVE Final    Comment:        The GeneXpert MRSA Assay (FDA approved for NASAL specimens only), is one component of a comprehensive MRSA colonization surveillance program. It is not intended to diagnose MRSA infection nor to guide or monitor  treatment for MRSA infections. Performed at [facility], [place] [address] [place], [place] [address]   Body fluid culture     Status: None   Collection Time: 10/01/19  4:09 PM   Specimen: Pleural, Right; Body Fluid  Result Value Ref Range Status   Specimen Description   Final    Pleural R Performed at [facility] [address] [place], [place] [address]    Special Requests   Final    NONE Performed at [facility],  [place] [address] [place], [place] [address]    Gram Stain   Final    MODERATE WBC PRESENT,BOTH PMN AND MONONUCLEAR NO ORGANISMS SEEN    Culture   Final    NO GROWTH 3 DAYS Performed at [facility], [place] [address] [place], [place] [address]    Report Status 10/05/2019 FINAL  Final         Radiology Studies: No results found.      Scheduled Meds: . Chlorhexidine Gluconate Cloth  6 each Topical Daily  . diclofenac Sodium  2 g Topical QID  . feeding supplement (ENSURE ENLIVE)  237 mL Oral BID BM  . feeding supplement (PRO-STAT SUGAR FREE 64)  30 mL Oral BID  . guaiFENesin  600 mg Oral BID  . hydrocerin   Topical BID  . mouth rinse  15 mL Mouth Rinse BID  . mouth rinse  15 mL Mouth Rinse BID  . multivitamin with minerals  1 tablet Oral Daily  . senna-docusate  1 tablet Oral BID  . sodium chloride flush  10-40 mL Intracatheter Q12H  . sodium chloride flush  3 mL Intravenous Q12H   Continuous Infusions: . sodium chloride 10 mL/hr at 10/05/19 0400  . cefTRIAXone (ROCEPHIN)  IV Stopped (10/04/19 1840)  . heparin 1,250 Units/hr (10/05/19 0400)     LOS: 12 days    Time spent: 35 mins.More than 50% of that time was spent in counseling and/or coordination of care.      Shelly Coss, MD [facility] Pager [phone]  If 7PM-7AM, please contact night-coverage [email or web address] Password [facility] 10/05/2019, 10:28 AM

## 2019-10-05 NOTE — Progress Notes (Addendum)
NAME:  Emily Kim, MRN:  [id number], DOB:  May 28, 1941, LOS: 12 ADMISSION DATE:  09/23/2019, CONSULTATION DATE: 09/29/2019 REFERRING MD: Berle Mull, CHIEF COMPLAINT: Metastatic lung cancer  Brief History   79 year old lady admitted with shortness of breath, hemoptysis Has had 50 pound weight loss over the last 3 months intermittent hemoptysis over 2 weeks Initial evaluation did reveal lung mass, R pleural effusion Underwent bronchoscopy with biopsy-adenocarcinoma-disease is widely metastatic Oncology on board Post Pleurx catheter placement 10/01/2019  Past Medical History   Past Medical History:  Diagnosis Date  . Uterine cancer ([place])      Significant Hospital Events   Had bronchoscopy with endobronchial ultrasound on 1/ 6 Right sided Pleurx placed 1/13 for malignant pleural effusion  Consults:  Oncology Cardiothoracic surgery  Procedures:  Bronchoscopy 1/7 1/13 Pleurx catheter placement  Significant Diagnostic Tests:  Surgical pathology 1/7-adenocarcinoma Cytology 1/7-non-small cell lung cancer CT scan of the chest significant for pulmonary emboli Pleural fluid cytology positive  Micro Data:  Pleural fluid culture 1/11 revealed Klebsiella and E. Coli sensitive to rocephin Pleural fluid culture 1/13-no organisms on gm stain, neg cultures on final  Antimicrobials:  Cefazolin 1/7 Rocephin 1/13>> Interim history/subjective:  slt congested cough min mucoid production / denies sob or cp   Scheduled Meds: . Chlorhexidine Gluconate Cloth  6 each Topical Daily  . diclofenac Sodium  2 g Topical QID  . feeding supplement (ENSURE ENLIVE)  237 mL Oral BID BM  . feeding supplement (PRO-STAT SUGAR FREE 64)  30 mL Oral BID  . guaiFENesin  600 mg Oral BID  . hydrocerin   Topical BID  . mouth rinse  15 mL Mouth Rinse BID  . mouth rinse  15 mL Mouth Rinse BID  . multivitamin with minerals  1 tablet Oral Daily  . senna-docusate  1 tablet Oral BID  . sodium chloride flush   10-40 mL Intracatheter Q12H  . sodium chloride flush  3 mL Intravenous Q12H   Continuous Infusions: . sodium chloride 10 mL/hr at 10/05/19 0400  . cefTRIAXone (ROCEPHIN)  IV Stopped (10/04/19 1840)  . heparin 1,250 Units/hr (10/05/19 0400)   PRN Meds:.sodium chloride, acetaminophen    guaiFENesin-dextromethorphan, HYDROcodone-acetaminophen, ipratropium-albuterol, ondansetron (ZOFRAN) IV, sodium chloride flush, sodium chloride flush  Objective   Blood pressure (!) 153/55, pulse 86, temperature 97.7 F (36.5 C), temperature source Oral, resp. rate 20, height 5\' 7"  (1.702 m), weight 90 kg, SpO2 100 %.        Intake/Output Summary (Last 24 hours) at 10/05/2019 1021 Last data filed at 10/05/2019 0800 Gross per 24 hour  Intake 606.77 ml  Output 900 ml  Net -293.23 ml   Filed Weights   10/03/19 0500 10/04/19 0500 10/05/19 0500  Weight: 89 kg 88.4 kg 90 kg    Examination:  Elderly hoarse bf nad  Pt alert, approp nad @ 45 degrees with sats 100% on 3lpm No jvd Oropharynx clear,  mucosa nl Neck supple Lungs with a  Min scattered exp > insp rhonchi bilaterally and no drainage x 24 h from R pleurex / neg cultures noted  RRR no s3 or or sign murmur Abd obese with nl excursion  Extr warm with no edema or clubbing noted Neuro  Sensorium intact,  no apparent motor deficits       Resolved Hospital Problem list     Assessment & Plan:   Metastatic adenocarcinoma of the lung with postobstructive atelectasis, multiple metastatic deposits, malignant pleural effusion, evidence of lymphangitic spread of  cancer multiple metastases Plan Continue palliative radiation Supplemental oxygen to keep sats > 90% Ongoing palliative discussions Would highly recommend DO NOT RESUSCITATE status, unlikely to do well  Rule out right empyema -Culture data surprisingly came back late on 1/13 growing both E. coli and Klebsiella from pleural fluid.   Plan Have repeated pleural fluid culture on 1/13   Final cultures neg/ no further drainage so can convert to three times weekly p check baseline  PA and lat cxr 1/17      Pulmonary embolism -Secondary to widely metastatic lung cancer Plan Continue anticoagulation  Superior vena cava syndrome Plan Continue palliative radiation therapy    Christinia Gully, MD [facility] [place] [phone] After 5:30 PM or weekends, use [facility] [phone]

## 2019-10-05 NOTE — Progress Notes (Signed)
ANTICOAGULATION CONSULT NOTE - Follow Up Consult  Pharmacy Consult for heparin Indication: acute pulmonary embolus  No Known Allergies  Patient Measurements: Height: 5\' 7"  (170.2 cm) Weight: 198 lb 6.6 oz (90 kg) IBW/kg (Calculated) : 61.6 Heparin Dosing Weight: 81 kg  Vital Signs: Temp: 97.7 F (36.5 C) (01/17 0400) Temp Source: Oral (01/17 0400) BP: 154/46 (01/17 0600) Pulse Rate: 84 (01/17 0600)  Labs: Recent Labs    10/03/19 0142 10/03/19 0142 10/03/19 1026 10/04/19 0105 10/05/19 0146  HGB 9.0*  --   --  8.4*  --   HCT 29.0*  --   --  27.9*  --   PLT 204  --   --  217  --   HEPARINUNFRC 0.40   < > 0.59 0.40 0.44  CREATININE  --   --   --   --  0.55   < > = values in this interval not displayed.    Estimated Creatinine Clearance: 66.8 mL/min (by C-G formula based on SCr of 0.55 mg/dL).   Assessment: Patient is a 79 y.o F presented to the ED on 1/5 with c/o cough and SOB.  She was subsequently diagnosed with stage IV NSCLC and is currently on heparin drip for acute LLL PE.  Today, 10/05/2019: - heparin level remains therapeutic at 0.44 -  hgb down slightly 8.4, plts ok with labs on 1/16 - no bleeding documented   Goal of Therapy:  Heparin level 0.3-0.7 units/ml Monitor platelets by anticoagulation protocol: Yes   Plan:  - continue heparin drip at 1250 units/hr - daily heparin level - monitor for s/s bleeding  Modesto Ganoe P 10/05/2019,9:40 AM

## 2019-10-06 ENCOUNTER — Ambulatory Visit
Admit: 2019-10-06 | Discharge: 2019-10-06 | Disposition: A | Discharge status: Home / Self Care | Payer: Medicare Other | Attending: Radiation Oncology | Admitting: Radiation Oncology

## 2019-10-06 LAB — CBC
HCT: 26.8 % — ABNORMAL LOW (ref 36.0–46.0)
Hemoglobin: 8.1 g/dL — ABNORMAL LOW (ref 12.0–15.0)
MCH: 25.8 pg — ABNORMAL LOW (ref 26.0–34.0)
MCHC: 30.2 g/dL (ref 30.0–36.0)
MCV: 85.4 fL (ref 80.0–100.0)
Platelets: 251 10*3/uL (ref 150–400)
RBC: 3.14 MIL/uL — ABNORMAL LOW (ref 3.87–5.11)
RDW: 17.1 % — ABNORMAL HIGH (ref 11.5–15.5)
WBC: 8.6 10*3/uL (ref 4.0–10.5)
nRBC: 0 % (ref 0.0–0.2)

## 2019-10-06 LAB — HEPARIN LEVEL (UNFRACTIONATED): Heparin Unfractionated: 0.6 IU/mL (ref 0.30–0.70)

## 2019-10-06 MED ORDER — CEPHALEXIN 500 MG PO CAPS
500.0000 mg | ORAL_CAPSULE | Freq: Two times a day (BID) | ORAL | Status: DC
  Administered 2019-10-06 – 2019-10-10 (×9): 500 mg via ORAL
  Filled 2019-10-06 (×9): qty 1

## 2019-10-06 NOTE — TOC Initial Note (Addendum)
Transition of Care [facility]) - Initial/Assessment Note    Patient Details  Name: Emily Kim MRN: [id number] Date of Birth: Jun 13, 1941  [facility] CM/SW Contact:    Leeroy Cha, RN Phone Number: 10/06/2019, 1:21 PM  Clinical Narrative:                 snf accepted patient are: [facility]  Pt choice but cannot handle plurex [employer] [place]  List presented to patient for choice.Marland Kitchen tct patient no answer.  Expected Discharge Plan: Skilled Nursing Facility Barriers to Discharge: SNF Pending bed offer, Other (comment)(covid test)   Patient Goals and CMS Choice Patient states their goals for this hospitalization and ongoing recovery are:: to go home CMS Medicare.gov Compare Post Acute Care list provided to:: Patient Choice offered to / list presented to : Patient  Expected Discharge Plan and Services Expected Discharge Plan: [place]   Discharge Planning Services: CM Consult Post Acute Care Choice: [place] Living arrangements for the past 2 months: Single Family Home                                      Prior Living Arrangements/Services Living arrangements for the past 2 months: Single Family Home Lives with:: Spouse Patient language and need for interpreter reviewed:: No Do you feel safe going back to the place where you live?: Yes      Need for Family Participation in Patient Care: Yes (Comment) Care giver support system in place?: Yes (comment)   Criminal Activity/Legal Involvement Pertinent to Current Situation/Hospitalization: No - Comment as needed  Activities of Daily Living Home Assistive Devices/Equipment: Cane (specify quad or straight) ADL Screening (condition at time of admission) Patient's cognitive ability adequate to safely complete daily activities?: Yes Is the patient deaf or have difficulty hearing?: No Does the patient have difficulty seeing, even when  wearing glasses/contacts?: No Does the patient have difficulty concentrating, remembering, or making decisions?: Yes Patient able to express need for assistance with ADLs?: Yes Does the patient have difficulty dressing or bathing?: Yes Independently performs ADLs?: No Communication: Independent Dressing (OT): Needs assistance Is this a change from baseline?: Change from baseline, expected to last >3 days Grooming: Needs assistance Is this a change from baseline?: Change from baseline, expected to last >3 days Feeding: Independent Bathing: Needs assistance Is this a change from baseline?: Change from baseline, expected to last >3 days Toileting: Needs assistance Is this a change from baseline?: Change from baseline, expected to last >3days In/Out Bed: Needs assistance Is this a change from baseline?: Change from baseline, expected to last >3 days Walks in Home: Independent with device (comment)(cane, prior to hospitalization) Does the patient have difficulty walking or climbing stairs?: Yes Weakness of Legs: Both Weakness of Arms/Hands: Both  Permission Sought/Granted Permission sought to share information with : Case Manager Permission granted to share information with : Yes, Verbal Permission Granted  Share Information with NAME: area snf's           Emotional Assessment Appearance:: Appears stated age     Orientation: : Oriented to Self, Oriented to Place, Oriented to  Time, Oriented to Situation Alcohol / Substance Use: Other (comment) Psych Involvement: No (comment)  Admission diagnosis:  Hyponatremia [E87.1] Pulmonary embolism (HCC) [I26.99] SOB (shortness of breath) [R06.02] Pleural effusion [J90] Primary malignant neoplasm of lung metastatic to other  site, unspecified laterality ([place]) [C34.90] Syncope, unspecified syncope type [R55] Multiple subsegmental pulmonary emboli without acute cor pulmonale ([place]) [I26.94] Patient Active Problem List   Diagnosis Date Noted   . Difficult intravenous access   . Adenocarcinoma of right lung, stage 4 ([place])   . Pulmonary embolism ([place]) 09/23/2019  . 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 Pleural effusion 09/23/2019   PCP:  Patient, No Pcp Per Pharmacy:   [facility] [address] ([address] [place] [facility] [place] [address] [place] [address] Phone: [phone] Fax: [phone]     Social Determinants of Health (SDOH) Interventions    Readmission Risk Interventions No flowsheet data found.

## 2019-10-06 NOTE — NC FL2 (Signed)
[place] LEVEL OF CARE SCREENING TOOL     IDENTIFICATION  Patient Name: Emily Kim Birthdate: 1941/07/30 Sex: female Admission Date (Current Location): 09/23/2019  [facility] and [place] Number:  Herbalist and Address:  [facility],  [place] [address], [place]      Provider Number: [id number]  Attending Physician Name and Address:  Shelly Coss, MD  Relative Name and Phone Number:  Sanika Brosious son [phone]    Current Level of Care: Hospital Recommended Level of Care: [place] Prior Approval Number:    Date Approved/Denied:   PASRR Number: [id number]  Discharge Plan: SNF    Current Diagnoses: Patient Active Problem List   Diagnosis Date Noted  . Difficult intravenous access   . Adenocarcinoma of right lung, stage 4 ([place])   . Pulmonary embolism ([place]) 09/23/2019  . 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 Pleural effusion 09/23/2019    Orientation RESPIRATION BLADDER Height & Weight     Self, Time, Situation, Place  Normal Continent Weight: 91.7 kg Height:  5\' 7"  (170.2 cm)  BEHAVIORAL SYMPTOMS/MOOD NEUROLOGICAL BOWEL NUTRITION STATUS      Continent Diet(reglar)  AMBULATORY STATUS COMMUNICATION OF NEEDS Skin   Extensive Assist Verbally Normal                       Personal Care Assistance Level of Assistance  Bathing, Feeding, Dressing Bathing Assistance: Limited assistance Feeding assistance: Limited assistance Dressing Assistance: Limited assistance     Functional Limitations Info  Sight, Hearing, Speech Sight Info: Impaired Hearing Info: Adequate Speech Info: Adequate    SPECIAL CARE FACTORS FREQUENCY  PT (By licensed PT), OT (By licensed OT)     PT Frequency: 5x weekly OT Frequency: 5xweekly            Contractures Contractures Info: Not present    Additional Factors Info  Code Status Code Status Info: full             Current Medications (10/06/2019):  This is the  current hospital active medication list Current Facility-Administered Medications  Medication Dose Route Frequency Provider Last Rate Last Admin  . 0.9 %  sodium chloride infusion  250 mL Intravenous PRN Erick Colace, NP 10 mL/hr at 10/06/19 0800 Rate Verify at 10/06/19 0800  . acetaminophen (TYLENOL) tablet 650 mg  650 mg Oral Q6H PRN Erick Colace, NP       Or  . acetaminophen (TYLENOL) suppository 650 mg  650 mg Rectal Q6H PRN Erick Colace, NP      . cephALEXin (KEFLEX) capsule 500 mg  500 mg Oral Q12H Shelly Coss, MD   500 mg at 10/06/19 1314  . Chlorhexidine Gluconate Cloth 2 % PADS 6 each  6 each Topical Daily Erick Colace, NP   6 each at 10/06/19 [phone]  . diclofenac Sodium (VOLTAREN) 1 % topical gel 2 g  2 g Topical QID Shelly Coss, MD   2 g at 10/06/19 0841  . feeding supplement (ENSURE ENLIVE) (ENSURE ENLIVE) liquid 237 mL  237 mL Oral BID BM Erick Colace, NP   237 mL at 10/06/19 0841  . feeding supplement (PRO-STAT SUGAR FREE 64) liquid 30 mL  30 mL Oral BID Erick Colace, NP   30 mL at 10/06/19 1315  . guaiFENesin (MUCINEX) 12 hr tablet 600 mg  600 mg Oral BID Erick Colace, NP   600 mg at 10/06/19 0837  .  guaiFENesin-dextromethorphan (ROBITUSSIN DM) 100-10 MG/5ML syrup 10 mL  10 mL Oral Q4H PRN Erick Colace, NP   10 mL at 10/04/19 0059  . heparin ADULT infusion 100 units/mL (25000 units/238mL sodium chloride 0.45%)  1,250 Units/hr Intravenous Continuous Adhikari, Amrit, MD 12.5 mL/hr at 10/06/19 0800 1,250 Units/hr at 10/06/19 0800  . hydrocerin (EUCERIN) cream   Topical BID Erick Colace, NP   Given at 10/06/19 [phone]  . HYDROcodone-acetaminophen (NORCO/VICODIN) 5-325 MG per tablet 1 tablet  1 tablet Oral Q6H PRN Erick Colace, NP   1 tablet at 10/01/19 2035  . ipratropium-albuterol (DUONEB) 0.5-2.5 (3) MG/3ML nebulizer solution 3 mL  3 mL Nebulization Q4H PRN Erick Colace, NP   3 mL at 09/24/19 2248  . MEDLINE mouth rinse  15 mL Mouth Rinse  BID Shelly Coss, MD   15 mL at 10/06/19 0842  . multivitamin with minerals tablet 1 tablet  1 tablet Oral Daily Erick Colace, NP   1 tablet at 10/06/19 9604  . ondansetron (ZOFRAN) injection 4 mg  4 mg Intravenous Q6H PRN Erick Colace, NP      . senna-docusate (Senokot-S) tablet 1 tablet  1 tablet Oral BID Erick Colace, NP   1 tablet at 10/06/19 [phone]  . sodium chloride flush (NS) 0.9 % injection 10-40 mL  10-40 mL Intracatheter Q12H Erick Colace, NP   10 mL at 10/06/19 0842  . sodium chloride flush (NS) 0.9 % injection 10-40 mL  10-40 mL Intracatheter PRN Erick Colace, NP   10 mL at 10/01/19 1001  . sodium chloride flush (NS) 0.9 % injection 3 mL  3 mL Intravenous Q12H Erick Colace, NP   3 mL at 10/06/19 0842  . sodium chloride flush (NS) 0.9 % injection 3 mL  3 mL Intravenous PRN Erick Colace, NP         Discharge Medications: Please see discharge summary for a list of discharge medications.  Relevant Imaging Results:  Relevant Lab Results:   Additional Information SS#[id number]  [place], [place]

## 2019-10-06 NOTE — Progress Notes (Signed)
Nutrition Follow-up  DOCUMENTATION CODES:   Not applicable  INTERVENTION:  - continue Ensure Enlive BID and 30 ml Prostat BID. - continue to encourage PO intakes.    NUTRITION DIAGNOSIS:   Increased nutrient needs related to acute illness, cancer and cancer related treatments as evidenced by estimated needs. -ongoing  GOAL:   Patient will meet greater than or equal to 90% of their needs -unmet  MONITOR:   PO intake, Supplement acceptance, Labs, Weight trends  REASON FOR ASSESSMENT:   Malnutrition Screening Tool  ASSESSMENT:   79 year old female with medical history of uterine cancer s/p hysterectomy, hx of tobacco abuse (quit smoking 30 years ago), reported 50 lb weight loss in the past 3 months. She presented to the ED due to SOB and hemoptysis x2 weeks. CT chest on 1/5 showed acute segmental pulmonary emboli in L lobe thought to likely be metastatic lung cancer. She underwent bronch with endotracheal ultrasound for needle aspirations, brushings, and endobronchial biopsies. Preliminary biopsy concerning for NSCLC. She began palliative radiation on 1/7.  Recent meal completion documented is 15% of breakfast on 1/15 (42 kcal, 1 gram protein); 90% of breakfast and 90% of dinner on 1/16 (total of 597 kcal, 25 grams protein). Review of orders indicates that patient has been accepting Ensure ~50% of the time offered and prostat >90% of the time offered.   Patient did not order breakfast this AM and, rather, consumed left offer fruit she had in her room. She also drank 100% of a bottle of Ensure. She is not interested in lunch but would like the following items delivered around 1600-1700 for dinner: chef salad with ranch dressing, a piece of salmon, and 2 cups of cranberry juice.   Patient reports that appetite is fair to poor but that she understands the importance of eating well and that she forces herself to eat at least twice/day and to consume oral nutrition supplements. She has  been drinking at least 1 bottle of Ensure/day.   Weight has been trending up throughout hospitalization and weight today is the highest all admission.   Per notes, plan to d/c to SNF once a bed is available.    Labs reviewed; Na: 129 mmol/l, Cl: 94 mmol/l, Ca: 8.6 mg/dl. Medications reviewed; daily multivitamin with minerals, 1 tablet senokot BID.     NUTRITION - FOCUSED PHYSICAL EXAM:  completed; no muscle and no fat wasting, mild edema to all extremities.   Diet Order:   Diet Order            Diet 2 gram sodium Room service appropriate? Yes; Fluid consistency: Thin  Diet effective now              EDUCATION NEEDS:   No education needs have been identified at this time  Skin:  Skin Assessment: Reviewed RN Assessment  Last BM:  1/15  Height:   Ht Readings from Last 1 Encounters:  09/30/19 5\' 7"  (1.702 m)    Weight:   Wt Readings from Last 1 Encounters:  10/06/19 91.7 kg    Ideal Body Weight:  61.4 kg  BMI:  Body mass index is 31.66 kg/m.  Estimated Nutritional Needs:   Kcal:  2200-2400 kcal  Protein:  110-125 grams  Fluid:  >/= 2.2 L/day     Emily Matin, MS, RD, LDN, [facility] Inpatient Clinical Dietitian Pager # [phone] After hours/weekend pager # [phone]

## 2019-10-06 NOTE — Progress Notes (Signed)
ANTICOAGULATION CONSULT NOTE - Follow Up Consult  Pharmacy Consult for heparin Indication: acute pulmonary embolus  No Known Allergies  Patient Measurements: Height: 5\' 7"  (170.2 cm) Weight: 202 lb 2.6 oz (91.7 kg) IBW/kg (Calculated) : 61.6 Heparin Dosing Weight: 81 kg  Vital Signs: Temp: 97.8 F (36.6 C) (01/18 0308) Temp Source: Axillary (01/18 0308) BP: 131/51 (01/18 0600) Pulse Rate: 87 (01/18 0600)  Labs: Recent Labs    10/04/19 0105 10/05/19 0146 10/06/19 0210  HGB 8.4*  --  8.1*  HCT 27.9*  --  26.8*  PLT 217  --  251  HEPARINUNFRC 0.40 0.44 0.60  CREATININE  --  0.55  --     Estimated Creatinine Clearance: 67.3 mL/min (by C-G formula based on SCr of 0.55 mg/dL).   Assessment: Patient is a 79 y.o F presented to the ED on 1/5 with c/o cough and SOB.  She was subsequently diagnosed with stage IV NSCLC and is currently on heparin drip for acute LLL PE.  Today, 10/06/2019: - heparin level remains therapeutic at 0.60 -  hgb down slightly 8.1, plts WNL - no bleeding documented   Goal of Therapy:  Heparin level 0.3-0.7 units/ml Monitor platelets by anticoagulation protocol: Yes   Plan:  - continue heparin drip at 1250 units/hr - daily heparin level - monitor for s/s bleeding - in 1/8 note oncology rec: [facility] and if unable to do LMWH, then Xarelto F/u ability to transition to [place] or Xarelto  Eudelia Bunch, Pharm.D [phone] 10/06/2019 8:30 AM

## 2019-10-06 NOTE — Progress Notes (Signed)
PROGRESS NOTE    Emily Kim  BJS:[id number] DOB: 1941-02-13 DOA: 09/23/2019 PCP: Patient, No Pcp Per   Brief Narrative:  Patient is a 79 year old female with history of uterine cancer status post hysterectomy who presented with shortness of breath, hemoptysis for 2 weeks, weight loss.  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 Started on heparin drip.  Underwent Video bronchoscopy with endobronchial ultrasound for needle aspiration, brushing, endobronchial biopsy.  Biopsy showed non-small cell lung cancer, metastatic.  Being followed by PCCM, radiation oncology, oncology.  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  She also had opacification of the right hemithorax from reaccumulation of pleural effusion.  Underwent  Pleurx cath placement on 10/01/19. She remains hemodynamically stable.  She is medically ready for discharge to skilled nursing facility as soon as bed is available.  She needs to follow-up with oncology, radiation oncology, pulmonary as an outpatient.  Assessment & Plan:   Principal Problem:   Pulmonary embolism (HCC) Active Problems:   SVC syndrome   Pleural effusion   Adenocarcinoma of right lung, stage 4 (HCC)   Difficult intravenous access   Pulmonary emboli: Presented with dyspnea.  Found to be hypoxic on presentation.  Currently in 2-3 L of oxygen per minute.  Started on heparin drip,we have changed to Eliquis.  Pulmonary emboli is  due to metastatic lung cancer.  Metastatic lung cancer with lymphangitic spread, bone metastasis, malignant effusion of the right pleura: Presented with weight loss, shortness of breath.  Underwent diagnostic procedure by cardiothoracic team.  Biopsy showed metastatic non-small cell lung cancer( adenocarcinoma).  Oncology, radiation oncology  following. CT chest on 1/11/ showed obstruction of the right upper lobe bronchus and resultant collapse/consolidation of the right upper lobe. Persistent right middle lobe occlusion with associated atelectasis. Multiple bilateral hematogenously distributed pulmonary nodules and osseous metastases. CT abd/pelvis showed three  scattered indeterminate hypodense liver masses,largest 2.4 cm in segment 2, suspicious for liver metastases. ndeterminate 1.5 cm right adrenal nodule, suspicious for right adrenal metastasis.Widespread sclerotic osseous metastases throughout lumbar spine,sacrum and bilateral pelvic girdle, including in the left femoral neck predisposing to pathologic fracture. Undergoing radiation therapy. Palliative care also following.  Recurrent right pleural effusion: S/p thoracentesis and Pleurx catheter placement.Pleural fluid culture sent on 09/29/19 showed showed pansensitive Ecoli and K Pneumoniae.She was on  ceftriaxone.New culture NGTD.  Pleuro- VAC discontinued.  Antibiotics changed to oral, plan for total of 14 days course.  Severe SVC syndrome: Has swelling of face, severe edema of bilateral upper extremities.  Undergoing palliative radiation therapy.  Imaging showed marked stenosis/occlusion of the superior vena cava and marked narrowing of the right pulmonary artery  Hyponatremia: Stable.  Continue to monitor.  Goals of care: Palliative care consulted.Remains full code.  We feel that she is a candidate for hospice.Marland Kitchen  PT/OT recommended skilled nursing facility.       Nutrition Problem: Increased nutrient needs Etiology: acute illness, cancer and cancer related treatments      DVT prophylaxis: Eliquis Code Status: Full code Family Communication: Talked to Son Kaden Dunkel on 10/01/19 Disposition Plan: SNF as soon as bed is available   Consultants: PCCM, radiation oncology, oncology  Procedures: Endobronchial biopsy, thoracentesis, Pleurx catheter  placement  Antimicrobials:  Anti-infectives (From admission, onward)   Start     Dose/Rate Route Frequency Ordered Stop   10/06/19 1100  cephALEXin (KEFLEX) capsule 500 mg     500 mg Oral Every 12 hours 10/06/19 1045 10/15/19 0959   10/01/19 1600  cefTRIAXone (ROCEPHIN) 2 g in sodium chloride 0.9 % 100 mL IVPB  Status:  Discontinued     2 g 200 mL/hr over 30 Minutes Intravenous Every 24 hours 10/01/19 1441 10/06/19 1045   10/01/19 1130  ceFAZolin (ANCEF) IVPB 2g/100 mL premix     2 g 200 mL/hr over 30 Minutes Intravenous  Once 10/01/19 1033 10/01/19 1258      Subjective:  Patient seen and examined at the bedside this morning.  Hemodynamically stable.  Looks comfortable.  No active issues right now.  Pleuro-vac will be discontinued, dressing around Pleurx catheter changed..  Objective: Vitals:   10/06/19 0600 10/06/19 0700 10/06/19 0800 10/06/19 0900  BP: (!) 131/51  (!) 145/58   Pulse: 87 95 97 92  Resp: 18 20 (!) 23 (!) 27  Temp:   98 F (36.7 C)   TempSrc:   Oral   SpO2: 100% 97% 99% 98%  Weight:      Height:        Intake/Output Summary (Last 24 hours) at 10/06/2019 1046 Last data filed at 10/06/2019 0800 Gross per 24 hour  Intake 683.53 ml  Output 1085 ml  Net -401.47 ml   Filed Weights   10/04/19 0500 10/05/19 0500 10/06/19 0500  Weight: 88.4 kg 90 kg 91.7 kg    Examination:   General exam: Comfortable, not in distress  Respiratory system: Bilateral decreased air entry in the bases, right Pleurx catheter  cardiovascular system: S1 & S2 heard, RRR. No JVD, murmurs, rubs, gallops or clicks. Gastrointestinal system: Abdomen is nondistended, soft and nontender. No organomegaly or masses felt. Normal bowel sounds heard. Central nervous system: Alert and oriented. No focal neurological deficits. Extremities: Swelling of bilateral upper extremities Skin: No rashes, lesions or ulcers,no icterus ,no pallor     Data Reviewed: I have personally reviewed following  labs and imaging studies  CBC: Recent Labs  Lab 10/01/19 0154 10/02/19 0401 10/03/19 0142 10/04/19 0105 10/06/19 0210  WBC 11.2* 9.8 9.8 8.6 8.6  HGB 9.7* 9.0* 9.0* 8.4* 8.1*  HCT 32.0* 30.2* 29.0* 27.9* 26.8*  MCV 84.2 86.0 84.5 85.1 85.4  PLT 259 199 204 217 825   Basic Metabolic Panel: Recent Labs  Lab 09/30/19 0500 10/05/19 0146  NA 131* 129*  K 4.6 4.5  CL 100 94*  CO2 22 28  GLUCOSE 123* 105*  BUN 17 20  CREATININE 0.64 0.55  CALCIUM 8.7* 8.6*  MG 1.8  --    GFR: Estimated Creatinine Clearance: 67.3 mL/min (by C-G formula based on SCr of 0.55 mg/dL). Liver Function Tests: Recent Labs  Lab 09/30/19 0500  AST 22  ALT 15  ALKPHOS 123  BILITOT 0.7  PROT 5.0*  ALBUMIN 2.2*   No results for input(s): LIPASE, AMYLASE in the last 168 hours. No results for input(s): AMMONIA in the last 168 hours. Coagulation Profile: No results for input(s): INR, PROTIME in the last 168 hours. Cardiac Enzymes: No results for input(s): CKTOTAL, CKMB, CKMBINDEX, TROPONINI in the last 168 hours. BNP (last 3 results) No results for input(s): PROBNP in the last 8760 hours. HbA1C: No results for input(s): HGBA1C in the last 72 hours. CBG: No results for input(s): GLUCAP in the last 168 hours. Lipid Profile: No results for input(s): CHOL, HDL, LDLCALC, TRIG, CHOLHDL, LDLDIRECT in the last 72 hours. Thyroid Function Tests: No results  for input(s): TSH, T4TOTAL, FREET4, T3FREE, THYROIDAB in the last 72 hours. Anemia Panel: No results for input(s): VITAMINB12, FOLATE, FERRITIN, TIBC, IRON, RETICCTPCT in the last 72 hours. Sepsis Labs: Recent Labs  Lab 09/30/19 0500  LATICACIDVEN 1.4    Recent Results (from the past 240 hour(s))  Body fluid culture     Status: None   Collection Time: 09/29/19 12:50 PM   Specimen: PATH Cytology Pleural fluid  Result Value Ref Range Status   Specimen Description   Final    PLEURAL Performed at [facility]  [address], [place], [place] [address]    Special Requests   Final    NONE Performed at [facility], [place] [address] [place], [place] [address]    Gram Stain   Final    RARE WBC PRESENT,BOTH PMN AND MONONUCLEAR RARE GRAM NEGATIVE RODS Performed at [facility], [place] [address], [place], [place] [address]    Culture   Final    ABUNDANT ESCHERICHIA COLI ABUNDANT KLEBSIELLA PNEUMONIAE    Report Status 10/01/2019 FINAL  Final   Organism ID, Bacteria ESCHERICHIA COLI  Final   Organism ID, Bacteria KLEBSIELLA PNEUMONIAE  Final      Susceptibility   Escherichia coli - MIC*    AMPICILLIN 4 SENSITIVE Sensitive     CEFAZOLIN <=4 SENSITIVE Sensitive     CEFEPIME <=0.12 SENSITIVE Sensitive     CEFTAZIDIME <=1 SENSITIVE Sensitive     CEFTRIAXONE <=0.25 SENSITIVE Sensitive     CIPROFLOXACIN >=4 RESISTANT Resistant     GENTAMICIN <=1 SENSITIVE Sensitive     IMIPENEM <=0.25 SENSITIVE Sensitive     TRIMETH/SULFA <=20 SENSITIVE Sensitive     AMPICILLIN/SULBACTAM 4 SENSITIVE Sensitive     PIP/TAZO <=4 SENSITIVE Sensitive     * ABUNDANT ESCHERICHIA COLI   Klebsiella pneumoniae - MIC*    AMPICILLIN >=32 RESISTANT Resistant     CEFAZOLIN <=4 SENSITIVE Sensitive     CEFEPIME <=0.12 SENSITIVE Sensitive     CEFTAZIDIME <=1 SENSITIVE Sensitive     CEFTRIAXONE <=0.25 SENSITIVE Sensitive     CIPROFLOXACIN <=0.25 SENSITIVE Sensitive     GENTAMICIN <=1 SENSITIVE Sensitive     IMIPENEM <=0.25 SENSITIVE Sensitive     TRIMETH/SULFA <=20 SENSITIVE Sensitive     AMPICILLIN/SULBACTAM 8 SENSITIVE Sensitive     PIP/TAZO <=4 SENSITIVE Sensitive     * ABUNDANT KLEBSIELLA PNEUMONIAE  MRSA PCR Screening     Status: None   Collection Time: 09/30/19  5:17 PM   Specimen: Nasopharyngeal  Result Value Ref Range Status   MRSA by PCR NEGATIVE NEGATIVE Final    Comment:        The GeneXpert MRSA Assay (FDA approved for NASAL specimens only), is one component of a comprehensive MRSA  colonization surveillance program. It is not intended to diagnose MRSA infection nor to guide or monitor treatment for MRSA infections. Performed at [facility], [place] [address] [place], [place] [address]   Body fluid culture     Status: None   Collection Time: 10/01/19  4:09 PM   Specimen: Pleural, Right; Body Fluid  Result Value Ref Range Status   Specimen Description   Final    Pleural R Performed at [facility] [address], [place], [place] [address]    Special Requests   Final    NONE Performed at [facility], [place] [address], [place], [place] [address]    Gram Stain  Final    MODERATE WBC PRESENT,BOTH PMN AND MONONUCLEAR NO ORGANISMS SEEN    Culture   Final    NO GROWTH 3 DAYS Performed at [facility], [place] [address], [place], [place] [address]    Report Status 10/05/2019 FINAL  Final         Radiology Studies: DG Chest 2 View  Result Date: 10/05/2019 CLINICAL DATA:  Pleural effusion EXAM: CHEST - 2 VIEW COMPARISON:  Radiograph 10/05/2019 FINDINGS: Redemonstration of an inferiorly directed right chest tube. Tip terminates in the mid to lower right thorax medially. Redemonstration of the partially loculated moderate right pleural effusion with fluid tracking along the fissures. Diffusely increased opacity in the right hemithorax is compatible with passive areas of atelectasis and fluid layering posteriorly. Nodular opacities are again seen in the left lung. Small left effusion is present as well. Visible portions of the cardiac silhouette are unchanged from prior with a calcified aorta. IMPRESSION: Stable appearance of the chest with moderate right pleural effusion and small left pleural effusion. Stable nodular opacities in the left lung. Electronically Signed   By: Lovena Le M.D.   On: 10/05/2019 15:54   DG Chest Port 1 View  Result Date: 10/05/2019 CLINICAL DATA:  79 year old female with  history of uterine cancer status post hysterectomy presenting with shortness of breath and hemoptysis for the past 2 weeks with some weight loss. EXAM: PORTABLE CHEST 1 VIEW COMPARISON:  Chest x-ray 10/02/2019. FINDINGS: Right-sided chest tube with tip in the medial aspect of the mid right hemithorax. Large right pleural effusion which remains partially loculated, with the largest component in the apex of the right hemithorax. Multiple pulmonary nodules noted throughout the left lung. Small left pleural effusion. No evidence of pulmonary edema. Heart size is upper limits of normal. Masslike area in the right hilar region. Aortic atherosclerosis. IMPRESSION: 1. Support apparatus, as above. 2. Known right upper lobe perihilar mass redemonstrated with persistent large partially loculated right pleural effusion, stable compared to prior examinations. 3. Small left pleural effusion. 4. Multiple pulmonary nodules in the left lung, concerning for metastatic disease. Electronically Signed   By: Vinnie Langton M.D.   On: 10/05/2019 14:29        Scheduled Meds: . cephALEXin  500 mg Oral Q12H  . Chlorhexidine Gluconate Cloth  6 each Topical Daily  . diclofenac Sodium  2 g Topical QID  . feeding supplement (ENSURE ENLIVE)  237 mL Oral BID BM  . feeding supplement (PRO-STAT SUGAR FREE 64)  30 mL Oral BID  . guaiFENesin  600 mg Oral BID  . hydrocerin   Topical BID  . mouth rinse  15 mL Mouth Rinse BID  . multivitamin with minerals  1 tablet Oral Daily  . senna-docusate  1 tablet Oral BID  . sodium chloride flush  10-40 mL Intracatheter Q12H  . sodium chloride flush  3 mL Intravenous Q12H   Continuous Infusions: . sodium chloride 10 mL/hr at 10/06/19 0800  . heparin 1,250 Units/hr (10/06/19 0800)     LOS: 13 days    Time spent: 35 mins.More than 50% of that time was spent in counseling and/or coordination of care.      Shelly Coss, MD [facility] Pager [phone]  If 7PM-7AM,  please contact night-coverage [email or web address] Password [facility] 10/06/2019, 10:46 AM

## 2019-10-06 NOTE — Progress Notes (Signed)
NAME:  Emily Kim, MRN:  [id number], DOB:  10/27/1940, LOS: 19 ADMISSION DATE:  09/23/2019, CONSULTATION DATE: 09/29/2019 REFERRING MD: Berle Mull, CHIEF COMPLAINT: Metastatic lung cancer  Brief History   79 year old lady admitted with shortness of breath, hemoptysis Has had 50 pound weight loss over the last 3 months intermittent hemoptysis over 2 weeks Initial evaluation did reveal lung mass, R pleural effusion Underwent bronchoscopy with biopsy-adenocarcinoma-disease is widely metastatic Oncology on board. Post Pleurx catheter placement 10/01/2019  Past Medical History   Past Medical History:  Diagnosis Date  . Uterine cancer ([place])    Significant Hospital Events   Had bronchoscopy with endobronchial ultrasound on 1/ 6 Right sided Pleurx placed 1/13 for malignant pleural effusion  Consults:  Oncology Cardiothoracic surgery  Procedures:  Bronchoscopy 1/7 1/13 Pleurx catheter placement  Significant Diagnostic Tests:  Surgical pathology 1/7-adenocarcinoma Cytology 1/7-non-small cell lung cancer CT scan of the chest significant for pulmonary emboli Pleural fluid cytology positive  Micro Data:  Pleural fluid culture 1/11 revealed Klebsiella and E. Coli sensitive to rocephin Pleural fluid culture 1/13-no organisms on gm stain, neg cultures on final  Antimicrobials:  Cefazolin 1/7 Rocephin 1/13>>  Interim history/subjective:   Doing well today. No pain. Breathing stable.    Scheduled Meds: . Chlorhexidine Gluconate Cloth  6 each Topical Daily  . diclofenac Sodium  2 g Topical QID  . feeding supplement (ENSURE ENLIVE)  237 mL Oral BID BM  . feeding supplement (PRO-STAT SUGAR FREE 64)  30 mL Oral BID  . guaiFENesin  600 mg Oral BID  . hydrocerin   Topical BID  . mouth rinse  15 mL Mouth Rinse BID  . mouth rinse  15 mL Mouth Rinse BID  . multivitamin with minerals  1 tablet Oral Daily  . senna-docusate  1 tablet Oral BID  . sodium chloride flush  10-40 mL  Intracatheter Q12H  . sodium chloride flush  3 mL Intravenous Q12H   Continuous Infusions: . sodium chloride 250 mL (10/06/19 0639)  . cefTRIAXone (ROCEPHIN)  IV Stopped (10/05/19 1638)  . heparin 1,250 Units/hr (10/06/19 5329)    Objective   Blood pressure (!) 131/51, pulse 87, temperature 97.8 F (36.6 C), temperature source Axillary, resp. rate 18, height 5\' 7"  (1.702 m), weight 91.7 kg, SpO2 100 %.        Intake/Output Summary (Last 24 hours) at 10/06/2019 0829 Last data filed at 10/06/2019 0600 Gross per 24 hour  Intake 608.41 ml  Output 1085 ml  Net -476.59 ml   Filed Weights   10/04/19 0500 10/05/19 0500 10/06/19 0500  Weight: 88.4 kg 90 kg 91.7 kg    Examination: General appearance: 79 y.o., female, NAD, conversant  Eyes: anicteric sclerae, tracking  HENT: NCAT; oropharynx, MMM  Neck: trachea midline, large neck, difficult to assess jvd, non-obvious  Lungs: CTAB, no crackles CV: RRR, S1, S2, no MRGs  Abdomen: soft, NT, ND  Extremities: No peripheral edema, radial and DP pulses present bilaterally  Skin: Normal temperature, turgor and texture; no rash Psych: Appropriate affect Neuro: Alert and oriented to person and place, no focal deficit   Resolved Hospital Problem list     Assessment & Plan:   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 Goals of care should be clarified with advanced stage lung cancer. I agree with previous recommendations for DNR  status. Follow up with Dr. Julien Nordmann at cancer center   Abnormal pleural fluid cultures, I suspect this was a contaminate  Pleural fluid culture came back from 09/29/2019 growing E. coli and Klebsiella. Patient did not receive any antibiotics because they were not felt to have an  infection at that time. Pleurx catheter was placed on 10/01/2019 and repeat cultures were drawn.  These are negative.  Also, in between the 09/29/2019 and 10/01/2019 cultures no antimicrobials were given. This leads to the question as to whether or not they were contaminated at the time of innoculation on 1/11.   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 Please call with any questions.  [facility] 10/06/2019 8:30 AM

## 2019-10-07 ENCOUNTER — Telehealth: Payer: Self-pay | Admitting: Medical Oncology

## 2019-10-07 ENCOUNTER — Ambulatory Visit
Admit: 2019-10-07 | Discharge: 2019-10-07 | Disposition: A | Discharge status: Home / Self Care | Payer: Medicare Other | Attending: Radiation Oncology | Admitting: Radiation Oncology

## 2019-10-07 LAB — CBC
HCT: 28 % — ABNORMAL LOW (ref 36.0–46.0)
Hemoglobin: 8.8 g/dL — ABNORMAL LOW (ref 12.0–15.0)
MCH: 26.3 pg (ref 26.0–34.0)
MCHC: 31.4 g/dL (ref 30.0–36.0)
MCV: 83.6 fL (ref 80.0–100.0)
Platelets: 224 10*3/uL (ref 150–400)
RBC: 3.35 MIL/uL — ABNORMAL LOW (ref 3.87–5.11)
RDW: 17 % — ABNORMAL HIGH (ref 11.5–15.5)
WBC: 9.9 10*3/uL (ref 4.0–10.5)
nRBC: 0 % (ref 0.0–0.2)

## 2019-10-07 LAB — HEPARIN LEVEL (UNFRACTIONATED): Heparin Unfractionated: 0.39 IU/mL (ref 0.30–0.70)

## 2019-10-07 LAB — SARS CORONAVIRUS 2 (TAT 6-24 HRS): SARS Coronavirus 2: NEGATIVE

## 2019-10-07 MED ORDER — APIXABAN 5 MG PO TABS
5.0000 mg | ORAL_TABLET | Freq: Two times a day (BID) | ORAL | Status: DC
  Administered 2019-10-07 – 2019-10-10 (×7): 5 mg via ORAL
  Filled 2019-10-07 (×7): qty 1

## 2019-10-07 NOTE — Telephone Encounter (Signed)
Per Dr Julien Nordmann he is okay with pt going on Eloquis .

## 2019-10-07 NOTE — Progress Notes (Signed)
PROGRESS NOTE    Emily Kim  ZOX:[id number] DOB: 28-Sep-1940 DOA: 09/23/2019 PCP: Patient, No Pcp Per   Brief Narrative:  Patient is a 79 year old female with history of uterine cancer status post hysterectomy who presented with shortness of breath, hemoptysis for 2 weeks, weight loss.  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 Started on heparin drip.  Underwent Video bronchoscopy with endobronchial ultrasound for needle aspiration, brushing, endobronchial biopsy.  Biopsy showed non-small cell lung cancer, metastatic.  Being followed by PCCM, radiation oncology, oncology.  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  She also had opacification of the right hemithorax from reaccumulation of pleural effusion.  Underwent  Pleurx cath placement on 10/01/19. She remains hemodynamically stable.  She is medically ready for discharge to skilled nursing facility as soon as bed is available.  She needs to follow-up with oncology, radiation oncology, pulmonary as an outpatient.  Assessment & Plan:   Principal Problem:   Pulmonary embolism (HCC) Active Problems:   SVC syndrome   Pleural effusion   Adenocarcinoma of right lung, stage 4 (HCC)   Difficult intravenous access   Pulmonary emboli: Presented with dyspnea.  Found to be hypoxic on presentation.  Currently in 2-3 L of oxygen per minute.  Started on heparin drip,we have changed to Eliquis.  Pulmonary emboli is  due to metastatic lung cancer.  Metastatic lung cancer with lymphangitic spread, bone metastasis, malignant effusion of the right pleura: Presented with weight loss, shortness of breath.  Underwent diagnostic procedure by cardiothoracic team.  Biopsy showed metastatic non-small cell lung cancer( adenocarcinoma).  Oncology, radiation oncology  following. CT chest on 1/11/ showed obstruction of the right upper lobe bronchus and resultant collapse/consolidation of the right upper lobe. Persistent right middle lobe occlusion with associated atelectasis. Multiple bilateral hematogenously distributed pulmonary nodules and osseous metastases. CT abd/pelvis showed three  scattered indeterminate hypodense liver masses,largest 2.4 cm in segment 2, suspicious for liver metastases. ndeterminate 1.5 cm right adrenal nodule, suspicious for right adrenal metastasis.Widespread sclerotic osseous metastases throughout lumbar spine,sacrum and bilateral pelvic girdle, including in the left femoral neck predisposing to pathologic fracture. Undergoing radiation therapy. Palliative care also following.  Recurrent right pleural effusion: S/p thoracentesis and Pleurx catheter placement.Pleural fluid culture sent on 09/29/19 showed showed pansensitive Ecoli and K Pneumoniae.She was on  ceftriaxone.New culture NGTD.  Pleuro- VAC discontinued.  Antibiotics changed to oral, plan for total of 14 days course.  Severe SVC syndrome: Presented with swelling of face, severe edema of bilateral upper extremities.  Undergoing palliative radiation therapy.  Imaging showed marked stenosis/occlusion of the superior vena cava and marked narrowing of the right pulmonary artery  Hyponatremia: Stable.  Continue to monitor.  Goals of care: Palliative care consulted.Remains full code.  We feel that she is a candidate for hospice.Marland Kitchen  PT/OT recommended skilled nursing facility.       Nutrition Problem: Increased nutrient needs Etiology: acute illness, cancer and cancer related treatments      DVT prophylaxis: Eliquis Code Status: Full code Family Communication: Talked to Son Emily Kim on 10/01/19 Disposition Plan: SNF as soon as bed is available   Consultants: PCCM, radiation oncology, oncology  Procedures: Endobronchial biopsy, thoracentesis, Pleurx catheter  placement  Antimicrobials:  Anti-infectives (From admission, onward)   Start     Dose/Rate Route Frequency Ordered Stop   10/06/19  1100  cephALEXin (KEFLEX) capsule 500 mg     500 mg Oral Every 12 hours 10/06/19 1045 10/15/19 0959   10/01/19 1600  cefTRIAXone (ROCEPHIN) 2 g in sodium chloride 0.9 % 100 mL IVPB  Status:  Discontinued     2 g 200 mL/hr over 30 Minutes Intravenous Every 24 hours 10/01/19 1441 10/06/19 1045   10/01/19 1130  ceFAZolin (ANCEF) IVPB 2g/100 mL premix     2 g 200 mL/hr over 30 Minutes Intravenous  Once 10/01/19 1033 10/01/19 1258      Subjective:  Patient seen and examined at the bedside this morning.  Remains hemodynamically stable.  Complains of weakness today.  Respiratory status is same as yesterday.  Requiring 2 to 3 L of oxygen per minute.  Auscultation did not reveal any wheezes or crackles.  Objective: Vitals:   10/07/19 0400 10/07/19 0500 10/07/19 0750 10/07/19 0800  BP: (!) 169/93   (!) 153/68  Pulse: 99     Resp:      Temp:   97.7 F (36.5 C)   TempSrc:   Oral   SpO2: 94%     Weight:  89.9 kg    Height:        Intake/Output Summary (Last 24 hours) at 10/07/2019 1124 Last data filed at 10/07/2019 0400 Gross per 24 hour  Intake 269.99 ml  Output 1050 ml  Net -780.01 ml   Filed Weights   10/05/19 0500 10/06/19 0500 10/07/19 0500  Weight: 90 kg 91.7 kg 89.9 kg    Examination:    General exam: Not in distress, generalized weakness Respiratory system: Bilateral decreased air entry in the bases, no wheezes or crackles, right Pleurx catheter Cardiovascular system: S1 & S2 heard, RRR. No JVD, murmurs, rubs, gallops or clicks. Gastrointestinal system: Abdomen is nondistended, soft and nontender. No organomegaly or masses felt. Normal bowel sounds heard. Central nervous system: Alert and oriented. No focal neurological deficits. Extremities: Bilateral upper extremity  edema, no clubbing ,no cyanosis Skin: No rashes, lesions or ulcers,no  icterus ,no pallor    Data Reviewed: I have personally reviewed following labs and imaging studies  CBC: Recent Labs  Lab 10/02/19 0401 10/03/19 0142 10/04/19 0105 10/06/19 0210 10/07/19 0121  WBC 9.8 9.8 8.6 8.6 9.9  HGB 9.0* 9.0* 8.4* 8.1* 8.8*  HCT 30.2* 29.0* 27.9* 26.8* 28.0*  MCV 86.0 84.5 85.1 85.4 83.6  PLT 199 204 217 251 009   Basic Metabolic Panel: Recent Labs  Lab 10/05/19 0146  NA 129*  K 4.5  CL 94*  CO2 28  GLUCOSE 105*  BUN 20  CREATININE 0.55  CALCIUM 8.6*   GFR: Estimated Creatinine Clearance: 66.7 mL/min (by C-G formula based on SCr of 0.55 mg/dL). Liver Function Tests: No results for input(s): AST, ALT, ALKPHOS, BILITOT, PROT, ALBUMIN in the last 168 hours. No results for input(s): LIPASE, AMYLASE in the last 168 hours. No results for input(s): AMMONIA in the last 168 hours. Coagulation Profile: No results for input(s): INR, PROTIME in the last 168 hours. Cardiac Enzymes: No results for input(s): CKTOTAL, CKMB, CKMBINDEX, TROPONINI in the last 168 hours. BNP (last 3 results) No results for input(s): PROBNP in the last 8760 hours. HbA1C: No results for input(s): HGBA1C in the last 72 hours. CBG: No results for input(s): GLUCAP in the last 168 hours. Lipid Profile: No results for input(s): CHOL, HDL, LDLCALC, TRIG, CHOLHDL, LDLDIRECT in the last 72 hours. Thyroid Function Tests: No results for input(s): TSH, T4TOTAL, FREET4, T3FREE,  THYROIDAB in the last 72 hours. Anemia Panel: No results for input(s): VITAMINB12, FOLATE, FERRITIN, TIBC, IRON, RETICCTPCT in the last 72 hours. Sepsis Labs: No results for input(s): PROCALCITON, LATICACIDVEN in the last 168 hours.  Recent Results (from the past 240 hour(s))  Body fluid culture     Status: None   Collection Time: 09/29/19 12:50 PM   Specimen: PATH Cytology Pleural fluid  Result Value Ref Range Status   Specimen Description   Final    PLEURAL Performed at [facility] [address], [place], [place] [address]    Special Requests   Final    NONE Performed at [facility], [place] [address] [place], [place] [address]    Gram Stain   Final    RARE WBC PRESENT,BOTH PMN AND MONONUCLEAR RARE GRAM NEGATIVE RODS Performed at [facility], [place] [address], [place], [place] [address]    Culture   Final    ABUNDANT ESCHERICHIA COLI ABUNDANT KLEBSIELLA PNEUMONIAE    Report Status 10/01/2019 FINAL  Final   Organism ID, Bacteria ESCHERICHIA COLI  Final   Organism ID, Bacteria KLEBSIELLA PNEUMONIAE  Final      Susceptibility   Escherichia coli - MIC*    AMPICILLIN 4 SENSITIVE Sensitive     CEFAZOLIN <=4 SENSITIVE Sensitive     CEFEPIME <=0.12 SENSITIVE Sensitive     CEFTAZIDIME <=1 SENSITIVE Sensitive     CEFTRIAXONE <=0.25 SENSITIVE Sensitive     CIPROFLOXACIN >=4 RESISTANT Resistant     GENTAMICIN <=1 SENSITIVE Sensitive     IMIPENEM <=0.25 SENSITIVE Sensitive     TRIMETH/SULFA <=20 SENSITIVE Sensitive     AMPICILLIN/SULBACTAM 4 SENSITIVE Sensitive     PIP/TAZO <=4 SENSITIVE Sensitive     * ABUNDANT ESCHERICHIA COLI   Klebsiella pneumoniae - MIC*    AMPICILLIN >=32 RESISTANT Resistant     CEFAZOLIN <=4 SENSITIVE Sensitive     CEFEPIME <=0.12 SENSITIVE Sensitive     CEFTAZIDIME <=1 SENSITIVE Sensitive     CEFTRIAXONE <=0.25 SENSITIVE Sensitive     CIPROFLOXACIN <=0.25 SENSITIVE Sensitive     GENTAMICIN <=1 SENSITIVE Sensitive     IMIPENEM <=0.25 SENSITIVE Sensitive     TRIMETH/SULFA <=20 SENSITIVE Sensitive     AMPICILLIN/SULBACTAM 8 SENSITIVE Sensitive     PIP/TAZO <=4 SENSITIVE Sensitive     * ABUNDANT KLEBSIELLA PNEUMONIAE  MRSA PCR Screening     Status: None   Collection Time: 09/30/19  5:17 PM   Specimen: Nasopharyngeal  Result Value Ref Range Status   MRSA by PCR NEGATIVE NEGATIVE Final    Comment:        The GeneXpert MRSA Assay (FDA approved for NASAL specimens only), is one component of a comprehensive MRSA  colonization surveillance program. It is not intended to diagnose MRSA infection nor to guide or monitor treatment for MRSA infections. Performed at [facility], [place] [address], [place], [place] [address]   Body fluid culture     Status: None   Collection Time: 10/01/19  4:09 PM   Specimen: Pleural, Right; Body Fluid  Result Value Ref Range Status   Specimen Description   Final    Pleural R Performed at [facility] [address], [place],  [address]    Special Requests   Final    NONE Performed at [facility], [place] [address], [place],  [address]    Gram Stain   Final  MODERATE WBC PRESENT,BOTH PMN AND MONONUCLEAR NO ORGANISMS SEEN    Culture   Final    NO GROWTH 3 DAYS Performed at [facility], [place] [address], [place], [place] [address]    Report Status 10/05/2019 FINAL  Final         Radiology Studies: DG Chest 2 View  Result Date: 10/05/2019 CLINICAL DATA:  Pleural effusion EXAM: CHEST - 2 VIEW COMPARISON:  Radiograph 10/05/2019 FINDINGS: Redemonstration of an inferiorly directed right chest tube. Tip terminates in the mid to lower right thorax medially. Redemonstration of the partially loculated moderate right pleural effusion with fluid tracking along the fissures. Diffusely increased opacity in the right hemithorax is compatible with passive areas of atelectasis and fluid layering posteriorly. Nodular opacities are again seen in the left lung. Small left effusion is present as well. Visible portions of the cardiac silhouette are unchanged from prior with a calcified aorta. IMPRESSION: Stable appearance of the chest with moderate right pleural effusion and small left pleural effusion. Stable nodular opacities in the left lung. Electronically Signed   By: Lovena Le M.D.   On: 10/05/2019 15:54   DG Chest Port 1 View  Result Date: 10/05/2019 CLINICAL DATA:  79 year old female with  history of uterine cancer status post hysterectomy presenting with shortness of breath and hemoptysis for the past 2 weeks with some weight loss. EXAM: PORTABLE CHEST 1 VIEW COMPARISON:  Chest x-ray 10/02/2019. FINDINGS: Right-sided chest tube with tip in the medial aspect of the mid right hemithorax. Large right pleural effusion which remains partially loculated, with the largest component in the apex of the right hemithorax. Multiple pulmonary nodules noted throughout the left lung. Small left pleural effusion. No evidence of pulmonary edema. Heart size is upper limits of normal. Masslike area in the right hilar region. Aortic atherosclerosis. IMPRESSION: 1. Support apparatus, as above. 2. Known right upper lobe perihilar mass redemonstrated with persistent large partially loculated right pleural effusion, stable compared to prior examinations. 3. Small left pleural effusion. 4. Multiple pulmonary nodules in the left lung, concerning for metastatic disease. Electronically Signed   By: Vinnie Langton M.D.   On: 10/05/2019 14:29        Scheduled Meds: . apixaban  5 mg Oral BID  . cephALEXin  500 mg Oral Q12H  . Chlorhexidine Gluconate Cloth  6 each Topical Daily  . diclofenac Sodium  2 g Topical QID  . feeding supplement (ENSURE ENLIVE)  237 mL Oral BID BM  . feeding supplement (PRO-STAT SUGAR FREE 64)  30 mL Oral BID  . guaiFENesin  600 mg Oral BID  . hydrocerin   Topical BID  . mouth rinse  15 mL Mouth Rinse BID  . multivitamin with minerals  1 tablet Oral Daily  . senna-docusate  1 tablet Oral BID  . sodium chloride flush  10-40 mL Intracatheter Q12H  . sodium chloride flush  3 mL Intravenous Q12H   Continuous Infusions: . sodium chloride Stopped (10/06/19 1744)     LOS: 14 days    Time spent: 25 mins.More than 50% of that time was spent in counseling and/or coordination of care.      Shelly Coss, MD [facility] Pager [phone]  If 7PM-7AM, please contact  night-coverage [email or web address] Password [facility] 10/07/2019, 11:24 AM

## 2019-10-07 NOTE — Progress Notes (Addendum)
ANTICOAGULATION CONSULT NOTE - Follow Up Consult  Pharmacy Consult for heparin>>Eliquis Indication: acute pulmonary embolus  No Known Allergies  Patient Measurements: Height: 5\' 7"  (170.2 cm) Weight: 198 lb 3.1 oz (89.9 kg) IBW/kg (Calculated) : 61.6 Heparin Dosing Weight: 81 kg  Vital Signs: Temp: 97.7 F (36.5 C) (01/19 0750) Temp Source: Oral (01/19 0750) BP: 153/68 (01/19 0800) Pulse Rate: 99 (01/19 0400)  Labs: Recent Labs    10/05/19 0146 10/06/19 0210 10/07/19 0121  HGB  --  8.1* 8.8*  HCT  --  26.8* 28.0*  PLT  --  251 224  HEPARINUNFRC 0.44 0.60 0.39  CREATININE 0.55  --   --     Estimated Creatinine Clearance: 66.7 mL/min (by C-G formula based on SCr of 0.55 mg/dL).   Assessment: Patient is a 79 y.o F presented to the ED on 1/5 with c/o cough and SOB.  She was subsequently diagnosed with stage IV NSCLC and is currently on heparin drip for acute LLL PE.  Today, 10/07/2019: - heparin level remains therapeutic at 0.39 on 1250 units/hr -  hgb 8.8, plts WNL - no bleeding documented - to transition to [place] today. Full day # 13 of anticoag. Started on 09/23/19 D/w Dr Julien Nordmann OK to use Eliquis   Goal of Therapy:  Heparin level 0.3-0.7 units/ml Monitor platelets by anticoagulation protocol: Yes   Plan:  -dc heparin drip - will not need Eliquis 10 mg po bid x 7 days because she got 13 days of IV heparin - start Eliqus 5 mg po BID - dc CBC/heparin levels - will provide education   Eudelia Bunch, Pharm.D [phone] 10/07/2019 9:02 AM

## 2019-10-07 NOTE — Progress Notes (Signed)
PCCM:  Follow up in our clinic to see Dr. Zigmund Daniel within one week of discharge will be arranged to check on pleurex site.   Garner Nash, DO [facility] 10/07/2019 7:52 AM

## 2019-10-08 ENCOUNTER — Ambulatory Visit
Admit: 2019-10-08 | Discharge: 2019-10-08 | Disposition: A | Discharge status: Home / Self Care | Payer: Medicare Other | Attending: Radiation Oncology | Admitting: Radiation Oncology

## 2019-10-08 ENCOUNTER — Ambulatory Visit: Payer: Medicare Other | Admitting: Internal Medicine

## 2019-10-08 LAB — BASIC METABOLIC PANEL
Anion gap: 7 (ref 5–15)
BUN: 14 mg/dL (ref 8–23)
CO2: 30 mmol/L (ref 22–32)
Calcium: 8.8 mg/dL — ABNORMAL LOW (ref 8.9–10.3)
Chloride: 89 mmol/L — ABNORMAL LOW (ref 98–111)
Creatinine, Ser: 0.51 mg/dL (ref 0.44–1.00)
GFR calc Af Amer: >60 mL/min (ref >60)
GFR calc non Af Amer: >60 mL/min (ref >60)
Glucose, Bld: 127 mg/dL — ABNORMAL HIGH (ref 70–99)
Potassium: 4.6 mmol/L (ref 3.5–5.1)
Sodium: 126 mmol/L — ABNORMAL LOW (ref 135–145)

## 2019-10-08 MED ORDER — SODIUM CHLORIDE 1 G PO TABS
2.0000 g | ORAL_TABLET | Freq: Two times a day (BID) | ORAL | Status: DC
  Administered 2019-10-08 – 2019-10-10 (×5): 2 g via ORAL
  Filled 2019-10-08 (×5): qty 2

## 2019-10-08 MED ORDER — FUROSEMIDE 20 MG PO TABS
20.0000 mg | ORAL_TABLET | Freq: Every day | ORAL | Status: DC
  Administered 2019-10-08 – 2019-10-10 (×3): 20 mg via ORAL
  Filled 2019-10-08 (×3): qty 1

## 2019-10-08 MED ORDER — TAMSULOSIN HCL 0.4 MG PO CAPS
0.4000 mg | ORAL_CAPSULE | Freq: Every day | ORAL | Status: DC
  Administered 2019-10-08 – 2019-10-10 (×3): 0.4 mg via ORAL
  Filled 2019-10-08 (×3): qty 1

## 2019-10-08 NOTE — Plan of Care (Signed)

## 2019-10-08 NOTE — Progress Notes (Signed)
PROGRESS NOTE    Emily Kim  TGG:[id number] DOB: 11/23/1940 DOA: 09/23/2019 PCP: Patient, No Pcp Per   Brief Narrative:  Patient is a 79 year old female with history of uterine cancer status post hysterectomy who presented with shortness of breath, hemoptysis for 2 weeks, weight loss.  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 Started on heparin drip.  Underwent Video bronchoscopy with endobronchial ultrasound for needle aspiration, brushing, endobronchial biopsy.  Biopsy showed non-small cell lung cancer, metastatic.  Being followed by PCCM, radiation oncology, oncology.  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  She also had opacification of the right hemithorax from reaccumulation of pleural effusion.  Underwent  Pleurx cath placement on 10/01/19.  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  Started on salt tablets and Lasix. She remains hemodynamically stable. She needs to follow-up with oncology, radiation oncology, pulmonary as an outpatient.Plan for DC tomorrow.  Assessment & Plan:   Principal Problem:   Pulmonary embolism (HCC) Active Problems:   SVC syndrome   Pleural effusion   Adenocarcinoma of right lung, stage 4 (HCC)   Difficult intravenous access   Pulmonary emboli: Presented with dyspnea.  Found to be hypoxic on presentation.  Currently in 2-3 L of oxygen per minute.  Started on heparin drip,we have changed to Eliquis.  Pulmonary emboli is  due to metastatic lung cancer.  Metastatic lung cancer with lymphangitic spread, bone metastasis, malignant effusion of the right pleura: Presented with weight loss, shortness of breath.  Underwent diagnostic procedure by cardiothoracic team.  Biopsy showed metastatic non-small cell lung cancer(  adenocarcinoma).  Oncology, radiation oncology following. CT chest on 1/11/ showed obstruction of the right upper lobe bronchus and resultant collapse/consolidation of the right upper lobe. Persistent right middle lobe occlusion with associated atelectasis. Multiple bilateral hematogenously distributed pulmonary nodules and osseous metastases. CT abd/pelvis showed three  scattered indeterminate hypodense liver masses,largest 2.4 cm in segment 2, suspicious for liver metastases. ndeterminate 1.5 cm right adrenal nodule, suspicious for right adrenal metastasis.Widespread sclerotic osseous metastases throughout lumbar spine,sacrum and bilateral pelvic girdle, including in the left femoral neck predisposing to pathologic fracture. Undergoing radiation therapy. Palliative care also following.  Recurrent right pleural effusion: S/p thoracentesis and Pleurx catheter placement.Pleural fluid culture sent on 09/29/19 showed showed pansensitive Ecoli and K Pneumoniae.She was on  ceftriaxone.New culture NGTD.  Pleuro- VAC discontinued.  Antibiotics changed to oral, plan for total of 14 days course.  Severe SVC syndrome: Presented with swelling of face, severe edema of bilateral upper extremities.  Undergoing palliative radiation therapy.  Imaging showed marked stenosis/occlusion of the superior vena cava and marked narrowing of the right pulmonary artery  Hyponatremia: Sodium of 126 today.  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  Started on salt tablets and Lasix.  Check BMP tomorrow.  Urinary retention: Consistently retained urine and has to be in and out cathed.  We will put a Foley catheter.  Will start on tamsulosin.  Goals of care: Palliative care consulted.Remains full code.  We feel that she is a candidate for hospice.Marland Kitchen  PT/OT recommended skilled nursing facility.       Nutrition Problem: Increased nutrient needs Etiology: acute illness, cancer and cancer related treatments       DVT prophylaxis: Eliquis Code Status: Full code Family Communication: Talked to Son [name]  on 10/01/19 Disposition Plan: SNF tomorrow   Consultants: PCCM, radiation oncology, oncology  Procedures: Endobronchial biopsy, thoracentesis, Pleurx catheter placement  Antimicrobials:  Anti-infectives (From admission, onward)   Start     Dose/Rate Route Frequency Ordered Stop   10/06/19 1100  cephALEXin (KEFLEX) capsule 500 mg     500 mg Oral Every 12 hours 10/06/19 1045 10/15/19 0959   10/01/19 1600  cefTRIAXone (ROCEPHIN) 2 g in sodium chloride 0.9 % 100 mL IVPB  Status:  Discontinued     2 g 200 mL/hr over 30 Minutes Intravenous Every 24 hours 10/01/19 1441 10/06/19 1045   10/01/19 1130  ceFAZolin (ANCEF) IVPB 2g/100 mL premix     2 g 200 mL/hr over 30 Minutes Intravenous  Once 10/01/19 1033 10/01/19 1258      Subjective:  Patient seen and examined at the bedside this morning.  Hemodynamically stable.  She feels better today.  No breathing issues.  She was on room air during my evaluation and saturating fine.  Objective: Vitals:   10/08/19 0500 10/08/19 0800 10/08/19 0941 10/08/19 1127  BP:   (!) 144/43 136/82  Pulse:   99 (!) 102  Resp:  (!) 31 (!) 25 (!) 22  Temp:  98.9 F (37.2 C)  98.9 F (37.2 C)  TempSrc:      SpO2:  93% 93% 100%  Weight: 91.1 kg     Height:        Intake/Output Summary (Last 24 hours) at 10/08/2019 1242 Last data filed at 10/08/2019 0930 Gross per 24 hour  Intake 987 ml  Output 475 ml  Net 512 ml   Filed Weights   10/06/19 0500 10/07/19 0500 10/08/19 0500  Weight: 91.7 kg 89.9 kg 91.1 kg    Examination:   General exam: Not in distress, generalized weakness Respiratory system: Bilateral decreased air entry on the basis, no crackles or wheezes Cardiovascular system: S1 & S2 heard, RRR. No JVD, murmurs, rubs, gallops or clicks. Gastrointestinal system: Abdomen is nondistended, soft and nontender. No organomegaly or masses felt.  Normal bowel sounds heard. Central nervous system: Alert and oriented. No focal neurological deficits. Extremities: Bilateral upper extremity edema, no clubbing or cyanosis Skin: No rashes, lesions or ulcers,no icterus ,no pallor    Data Reviewed: I have personally reviewed following labs and imaging studies  CBC: Recent Labs  Lab 10/02/19 0401 10/03/19 0142 10/04/19 0105 10/06/19 0210 10/07/19 0121  WBC 9.8 9.8 8.6 8.6 9.9  HGB 9.0* 9.0* 8.4* 8.1* 8.8*  HCT 30.2* 29.0* 27.9* 26.8* 28.0*  MCV 86.0 84.5 85.1 85.4 83.6  PLT 199 204 217 251 885   Basic Metabolic Panel: Recent Labs  Lab 10/05/19 0146 10/08/19 0108  NA 129* 126*  K 4.5 4.6  CL 94* 89*  CO2 28 30  GLUCOSE 105* 127*  BUN 20 14  CREATININE 0.55 0.51  CALCIUM 8.6* 8.8*   GFR: Estimated Creatinine Clearance: 67.2 mL/min (by C-G formula based on SCr of 0.51 mg/dL). Liver Function Tests: No results for input(s): AST, ALT, ALKPHOS, BILITOT, PROT, ALBUMIN in the last 168 hours. No results for input(s): LIPASE, AMYLASE in the last 168 hours. No results for input(s): AMMONIA in the last 168 hours. Coagulation Profile: No results for input(s): INR, PROTIME in the last 168 hours. Cardiac Enzymes: No results for input(s): CKTOTAL, CKMB, CKMBINDEX, TROPONINI in the last 168 hours. BNP (last 3 results) No results for input(s): PROBNP in the last 8760 hours. HbA1C: No results for input(s): HGBA1C in  the last 72 hours. CBG: No results for input(s): GLUCAP in the last 168 hours. Lipid Profile: No results for input(s): CHOL, HDL, LDLCALC, TRIG, CHOLHDL, LDLDIRECT in the last 72 hours. Thyroid Function Tests: No results for input(s): TSH, T4TOTAL, FREET4, T3FREE, THYROIDAB in the last 72 hours. Anemia Panel: No results for input(s): VITAMINB12, FOLATE, FERRITIN, TIBC, IRON, RETICCTPCT in the last 72 hours. Sepsis Labs: No results for input(s): PROCALCITON, LATICACIDVEN in the last 168 hours.  Recent Results (from  the past 240 hour(s))  Body fluid culture     Status: None   Collection Time: 09/29/19 12:50 PM   Specimen: PATH Cytology Pleural fluid  Result Value Ref Range Status   Specimen Description   Final    PLEURAL Performed at [facility] [address], [place], [place] [address]    Special Requests   Final    NONE Performed at [facility], [place] [address] [place], [place] [address]    Gram Stain   Final    RARE WBC PRESENT,BOTH PMN AND MONONUCLEAR RARE GRAM NEGATIVE RODS Performed at [facility], [place] [address], [place], [place] [address]    Culture   Final    ABUNDANT ESCHERICHIA COLI ABUNDANT KLEBSIELLA PNEUMONIAE    Report Status 10/01/2019 FINAL  Final   Organism ID, Bacteria ESCHERICHIA COLI  Final   Organism ID, Bacteria KLEBSIELLA PNEUMONIAE  Final      Susceptibility   Escherichia coli - MIC*    AMPICILLIN 4 SENSITIVE Sensitive     CEFAZOLIN <=4 SENSITIVE Sensitive     CEFEPIME <=0.12 SENSITIVE Sensitive     CEFTAZIDIME <=1 SENSITIVE Sensitive     CEFTRIAXONE <=0.25 SENSITIVE Sensitive     CIPROFLOXACIN >=4 RESISTANT Resistant     GENTAMICIN <=1 SENSITIVE Sensitive     IMIPENEM <=0.25 SENSITIVE Sensitive     TRIMETH/SULFA <=20 SENSITIVE Sensitive     AMPICILLIN/SULBACTAM 4 SENSITIVE Sensitive     PIP/TAZO <=4 SENSITIVE Sensitive     * ABUNDANT ESCHERICHIA COLI   Klebsiella pneumoniae - MIC*    AMPICILLIN >=32 RESISTANT Resistant     CEFAZOLIN <=4 SENSITIVE Sensitive     CEFEPIME <=0.12 SENSITIVE Sensitive     CEFTAZIDIME <=1 SENSITIVE Sensitive     CEFTRIAXONE <=0.25 SENSITIVE Sensitive     CIPROFLOXACIN <=0.25 SENSITIVE Sensitive     GENTAMICIN <=1 SENSITIVE Sensitive     IMIPENEM <=0.25 SENSITIVE Sensitive     TRIMETH/SULFA <=20 SENSITIVE Sensitive     AMPICILLIN/SULBACTAM 8 SENSITIVE Sensitive     PIP/TAZO <=4 SENSITIVE Sensitive     * ABUNDANT KLEBSIELLA PNEUMONIAE  MRSA PCR Screening     Status: None    Collection Time: 09/30/19  5:17 PM   Specimen: Nasopharyngeal  Result Value Ref Range Status   MRSA by PCR NEGATIVE NEGATIVE Final    Comment:        The GeneXpert MRSA Assay (FDA approved for NASAL specimens only), is one component of a comprehensive MRSA colonization surveillance program. It is not intended to diagnose MRSA infection nor to guide or monitor treatment for MRSA infections. Performed at [facility], [place] [address], [place], [place] [address]   Body fluid culture     Status: None   Collection Time: 10/01/19  4:09 PM   Specimen: Pleural, Right; Body Fluid  Result Value Ref Range Status   Specimen Description   Final    Pleural R Performed at [facility],  [place] [address] [place], [place] [address]    Special Requests   Final    NONE Performed at [facility], [place] [address], [place], [place] [address]    Gram Stain   Final    MODERATE WBC PRESENT,BOTH PMN AND MONONUCLEAR NO ORGANISMS SEEN    Culture   Final    NO GROWTH 3 DAYS Performed at [facility], [place] [address], [place], [place] [address]    Report Status 10/05/2019 FINAL  Final  SARS CORONAVIRUS 2 (TAT 6-24 HRS) Nasopharyngeal Nasopharyngeal Swab     Status: None   Collection Time: 10/07/19  5:31 AM   Specimen: Nasopharyngeal Swab  Result Value Ref Range Status   SARS Coronavirus 2 NEGATIVE NEGATIVE Final    Comment: (NOTE) SARS-CoV-2 target nucleic acids are NOT DETECTED. The SARS-CoV-2 RNA is generally detectable in upper and lower respiratory specimens during the acute phase of infection. Negative results do not preclude SARS-CoV-2 infection, do not rule out co-infections with other pathogens, and should not be used as the sole basis for treatment or other patient management decisions. Negative results must be combined with clinical observations, patient history, and epidemiological information. The expected result is  Negative. Fact Sheet for Patients: SugarRoll.be Fact Sheet for Healthcare Providers: https://www.woods-mathews.com/ This test is not yet approved or cleared by the [place] FDA and  has been authorized for detection and/or diagnosis of SARS-CoV-2 by FDA under an Emergency Use Authorization (EUA). This EUA will remain  in effect (meaning this test can be used) for the duration of the COVID-19 declaration under Section 56 4(b)(1) of the Act, 21 U.S.C. section 360bbb-3(b)(1), unless the authorization is terminated or revoked sooner. Performed at [facility], [place] [address] [place], [place] [address]          Radiology Studies: No results found.      Scheduled Meds: . apixaban  5 mg Oral BID  . cephALEXin  500 mg Oral Q12H  . Chlorhexidine Gluconate Cloth  6 each Topical Daily  . diclofenac Sodium  2 g Topical QID  . feeding supplement (ENSURE ENLIVE)  237 mL Oral BID BM  . feeding supplement (PRO-STAT SUGAR FREE 64)  30 mL Oral BID  . furosemide  20 mg Oral Daily  . guaiFENesin  600 mg Oral BID  . hydrocerin   Topical BID  . mouth rinse  15 mL Mouth Rinse BID  . multivitamin with minerals  1 tablet Oral Daily  . senna-docusate  1 tablet Oral BID  . sodium chloride  2 g Oral BID WC   Continuous Infusions:    LOS: 15 days    Time spent: 25 mins.More than 50% of that time was spent in counseling and/or coordination of care.      Shelly Coss, MD [facility] Pager [phone]  If 7PM-7AM, please contact night-coverage [email or web address] Password [facility] 10/08/2019, 12:42 PM

## 2019-10-08 NOTE — TOC Progression Note (Signed)
Transition of Care [facility]) - Progression Note    Patient Details  Name: Emily Kim MRN: [id number] Date of Birth: November 20, 1940  Transition of Care [facility]) CM/SW Contact  Ross Ludwig, [place] Phone Number: 10/08/2019, 5:22 PM  Clinical Narrative:     CSW spoke to patient and she has chosen [facility], CSW contacted [place] at [place] and left a message to inform her that patient has accepted bed offer.  Expected Discharge Plan: [place] Barriers to Discharge: SNF Pending bed offer, Other (comment)(covid test)  Expected Discharge Plan and Services Expected Discharge Plan: [place]   Discharge Planning Services: CM Consult Post Acute Care Choice: [place] Living arrangements for the past 2 months: Single Family Home                                       Social Determinants of Health (SDOH) Interventions    Readmission Risk Interventions No flowsheet data found.

## 2019-10-08 NOTE — Plan of Care (Signed)
  Problem: Education: Goal: Knowledge of General Education information will improve Description: Including pain rating scale, medication(s)/side effects and non-pharmacologic comfort measures 10/08/2019 2333 by Laverle Patter, RN Outcome: Progressing 10/08/2019 2332 by Laverle Patter, RN Outcome: Progressing   Problem: Health Behavior/Discharge Planning: Goal: Ability to manage health-related needs will improve 10/08/2019 2333 by Laverle Patter, RN Outcome: Progressing 10/08/2019 2332 by Laverle Patter, RN Outcome: Progressing   Problem: Clinical Measurements: Goal: Ability to maintain clinical measurements within normal limits will improve 10/08/2019 2333 by Laverle Patter, RN Outcome: Progressing 10/08/2019 2332 by Laverle Patter, RN Outcome: Progressing Goal: Will remain free from infection 10/08/2019 2333 by Laverle Patter, RN Outcome: Progressing 10/08/2019 2332 by Laverle Patter, RN Outcome: Progressing Goal: Diagnostic test results will improve 10/08/2019 2333 by Laverle Patter, RN Outcome: Progressing 10/08/2019 2332 by Laverle Patter, RN Outcome: Progressing Goal: Respiratory complications will improve 10/08/2019 2333 by Laverle Patter, RN Outcome: Progressing 10/08/2019 2332 by Laverle Patter, RN Outcome: Progressing Goal: Cardiovascular complication will be avoided 10/08/2019 2333 by Laverle Patter, RN Outcome: Progressing 10/08/2019 2332 by Laverle Patter, RN Outcome: Progressing   Problem: Activity: Goal: Risk for activity intolerance will decrease 10/08/2019 2333 by Laverle Patter, RN Outcome: Progressing 10/08/2019 2332 by Laverle Patter, RN Outcome: Progressing   Problem: Nutrition: Goal: Adequate nutrition will be maintained 10/08/2019 2333 by Laverle Patter, RN Outcome: Progressing 10/08/2019 2332 by Laverle Patter, RN Outcome: Progressing   Problem: Coping: Goal: Level of anxiety will decrease 10/08/2019 2333 by Laverle Patter, RN Outcome: Progressing 10/08/2019 2332 by Laverle Patter,  RN Outcome: Progressing   Problem: Elimination: Goal: Will not experience complications related to bowel motility 10/08/2019 2333 by Laverle Patter, RN Outcome: Progressing 10/08/2019 2332 by Laverle Patter, RN Outcome: Progressing Goal: Will not experience complications related to urinary retention 10/08/2019 2333 by Laverle Patter, RN Outcome: Progressing 10/08/2019 2332 by Laverle Patter, RN Outcome: Progressing   Problem: Pain Managment: Goal: General experience of comfort will improve 10/08/2019 2333 by Laverle Patter, RN Outcome: Progressing 10/08/2019 2332 by Laverle Patter, RN Outcome: Progressing   Problem: Safety: Goal: Ability to remain free from injury will improve 10/08/2019 2333 by Laverle Patter, RN Outcome: Progressing 10/08/2019 2332 by Laverle Patter, RN Outcome: Progressing   Problem: Skin Integrity: Goal: Risk for impaired skin integrity will decrease 10/08/2019 2333 by Laverle Patter, RN Outcome: Progressing 10/08/2019 2332 by Laverle Patter, RN Outcome: Progressing

## 2019-10-08 NOTE — Progress Notes (Signed)
Bladder scan performed at 1230 this shift showing 327 mL. Pt has been in and out catheterized greater than 24h. MD notified, order placed to place foley catheter. Foley placed after one attempt returning 425 mL. Pt tolerated the procedure well.

## 2019-10-08 NOTE — Progress Notes (Signed)
[facility] Therapy Treatment Record Phone [phone]   Radiation Therapy was administered to Emily Kim on: 10/08/2019  1:54 PM and was treatment # 10 out of a planned course of 19 treatments.  Radiation Treatment  1). Beam photons with 6-10 energy  2). Brachytherapy None  3). Stereotactic Radiosurgery None  4). Other Radiation None     Caelan Branden A Jaaliyah Lucatero, RT (T)

## 2019-10-09 ENCOUNTER — Ambulatory Visit
Admit: 2019-10-09 | Discharge: 2019-10-09 | Disposition: A | Discharge status: Home / Self Care | Payer: Medicare Other | Attending: Radiation Oncology | Admitting: Radiation Oncology

## 2019-10-09 LAB — BASIC METABOLIC PANEL
Anion gap: 9 (ref 5–15)
BUN: 13 mg/dL (ref 8–23)
CO2: 28 mmol/L (ref 22–32)
Calcium: 8.4 mg/dL — ABNORMAL LOW (ref 8.9–10.3)
Chloride: 87 mmol/L — ABNORMAL LOW (ref 98–111)
Creatinine, Ser: 0.46 mg/dL (ref 0.44–1.00)
GFR calc Af Amer: >60 mL/min (ref >60)
GFR calc non Af Amer: >60 mL/min (ref >60)
Glucose, Bld: 109 mg/dL — ABNORMAL HIGH (ref 70–99)
Potassium: 4.8 mmol/L (ref 3.5–5.1)
Sodium: 124 mmol/L — ABNORMAL LOW (ref 135–145)

## 2019-10-09 LAB — SODIUM: Sodium: 125 mmol/L — ABNORMAL LOW (ref 135–145)

## 2019-10-09 MED ORDER — ENSURE ENLIVE PO LIQD: 237.0000 mL | Freq: Two times a day (BID) | ORAL | 12 refills | Status: AC

## 2019-10-09 MED ORDER — SENNOSIDES-DOCUSATE SODIUM 8.6-50 MG PO TABS: 1.0000 | ORAL_TABLET | Freq: Two times a day (BID) | ORAL | Status: AC

## 2019-10-09 MED ORDER — SODIUM CHLORIDE 1 G PO TABS: 2.0000 g | ORAL_TABLET | Freq: Two times a day (BID) | ORAL | Status: AC

## 2019-10-09 MED ORDER — GUAIFENESIN-DM 100-10 MG/5ML PO SYRP: 10.0000 mL | ORAL_SOLUTION | ORAL | 0 refills | Status: AC | PRN

## 2019-10-09 MED ORDER — FUROSEMIDE 20 MG PO TABS: 20.0000 mg | ORAL_TABLET | Freq: Every day | ORAL | Status: AC

## 2019-10-09 MED ORDER — CEPHALEXIN 500 MG PO CAPS: 500.0000 mg | ORAL_CAPSULE | Freq: Two times a day (BID) | ORAL | 0 refills | Status: AC

## 2019-10-09 MED ORDER — APIXABAN 5 MG PO TABS: 5.0000 mg | ORAL_TABLET | Freq: Two times a day (BID) | ORAL | Status: AC

## 2019-10-09 MED ORDER — IPRATROPIUM-ALBUTEROL 0.5-2.5 (3) MG/3ML IN SOLN: 3.0000 mL | RESPIRATORY_TRACT | Status: AC | PRN

## 2019-10-09 MED ORDER — TAMSULOSIN HCL 0.4 MG PO CAPS: 0.4000 mg | ORAL_CAPSULE | Freq: Every day | ORAL | Status: AC

## 2019-10-09 MED ORDER — ADULT MULTIVITAMIN W/MINERALS CH: 1.0000 | ORAL_TABLET | Freq: Every day | ORAL | Status: AC

## 2019-10-09 NOTE — Plan of Care (Signed)

## 2019-10-09 NOTE — TOC Progression Note (Addendum)
Transition of Care [facility]) - Progression Note    Patient Details  Name: Emily Kim MRN: [id number] Date of Birth: 07-23-1941  [facility] CM/SW Contact  Ross Ludwig, [place] Phone Number: 10/09/2019, 3:22 PM  Clinical Narrative:     CSW spoke to [facility], they are not able to accept patient now.  CSW is working on trying to find a different SNF placement for her.  CSW tried calling in the room to discuss different SNF options, but was unable to speak to patient.  CSW will try again.  3:55pm  CSW attempted to speak to patient again, unable to get a hold of her, CSW to try to contact patient's family.  4:35pm  CSW spoke with patient's son [name] [phone], he was given bed offers, he will research and let this CSW know which facility he has agreed to.  CSW informed him that patient will be ready for discharge as soon as I get a bed decision.  CSW requested that he call back tonight or tomorrow morning.  Expected Discharge Plan: [place] Barriers to Discharge: SNF Pending bed offer, Other (comment)(covid test)  Expected Discharge Plan and Services Expected Discharge Plan: [place]   Discharge Planning Services: CM Consult Post Acute Care Choice: [place] arrangements for the past 2 months: Single Family Home Expected Discharge Date: 10/09/19                                     Social Determinants of Health (SDOH) Interventions    Readmission Risk Interventions No flowsheet data found.

## 2019-10-09 NOTE — Progress Notes (Signed)
Physical Therapy Treatment Patient Details Name: Emily Kim MRN: [id number] DOB: [birth year] Today's Date: 10/09/2019    History of Present Illness 79 year old African-American female with past medical history significant for uterine cancer s/p hysterectomy [place], [place]), apparently notes 50lbs of weight loss over the past 3 month and reformed tobacco user.  Apparently, patient smoked 1 pack of cigarettes daily for 15 years, but quit about 30 years ago.  Patient presented with shortness of breath and hemoptysis intermittently over the past 2 weeks.  CTA chest done on 09/22/2018 revealed acute segmental pulmonary emboli in the left lobe with likely metastatic lung cancer (CTA chest finding is documented below).  Patient is currently on heparin drip.  Patient underwent video bronchoscopy with endotracheal ultrasound for needle aspirations, brushings and endobronchial biopsies. Preliminary biopsy is said to be suggestive of non-small cell lung cancer, metastatic.  Patient was seen by the radiation oncology team and they started palliative radiotherapy yesterday for severe superior vena cava syndrome.    PT Comments    Improved activity tolerance today. Pt was able to stand with RW and take a few pivotal steps from bed to the recliner. Vital signs stable on 2L O2 with activity. Pt fatigues quickly but put forth good effort.  Follow Up Recommendations  SNF;Supervision/Assistance - 24 hour     Equipment Recommendations  Other (comment)(defer to next venue)    Recommendations for Other Services       Precautions / Restrictions Precautions Precautions: Fall Restrictions Weight Bearing Restrictions: No    Mobility  Bed Mobility Overal bed mobility: Needs Assistance Bed Mobility: Supine to Sit     Supine to sit: Mod assist     General bed mobility comments: mod A to raise trunk  Transfers Overall transfer level: Needs assistance Equipment used: Rolling walker (2 wheeled) Transfers: Sit  to/from [facility] Sit to Stand: From elevated surface;Mod assist;+2 safety/equipment Stand pivot transfers: +2 safety/equipment;Min assist       General transfer comment: assist to rise and steady, sit to stand x 2 trials, increased time/effort for all mobility  Ambulation/Gait             General Gait Details: unable   Stairs             Wheelchair Mobility    Modified Rankin (Stroke Patients Only)       Balance Overall balance assessment: Needs assistance Sitting-balance support: Feet supported;Bilateral upper extremity supported Sitting balance-Leahy Scale: Fair     Standing balance support: Bilateral upper extremity supported Standing balance-Leahy Scale: Poor Standing balance comment: heavy reliance upon BUE support                            Cognition Arousal/Alertness: Awake/alert Behavior During Therapy: WFL for tasks assessed/performed Overall Cognitive Status: Within Functional Limits for tasks assessed                                        Exercises      General Comments        Pertinent Vitals/Pain Pain Assessment: No/denies pain    Home Living                      Prior Function            PT Goals (current goals can now be found in the  care plan section) Acute Rehab PT Goals Patient Stated Goal: go to ALF PT Goal Formulation: With patient Time For Goal Achievement: 10/13/19 Potential to Achieve Goals: Poor Progress towards PT goals: Progressing toward goals    Frequency    Min 2X/week      PT Plan Current plan remains appropriate    Co-evaluation              AM-PAC PT "6 Clicks" Mobility   Outcome Measure  Help needed turning from your back to your side while in a flat bed without using bedrails?: A Little Help needed moving from lying on your back to sitting on the side of a flat bed without using bedrails?: A Lot Help needed moving to and from a bed  to a chair (including a wheelchair)?: A Lot Help needed standing up from a chair using your arms (e.g., wheelchair or bedside chair)?: A Lot Help needed to walk in hospital room?: Total Help needed climbing 3-5 steps with a railing? : Total 6 Click Score: 11    End of Session Equipment Utilized During Treatment: Gait belt Activity Tolerance: Patient limited by fatigue Patient left: with call bell/phone within reach;in chair;with nursing/sitter in room Nurse Communication: Mobility status;Need for lift equipment PT Visit Diagnosis: Other abnormalities of gait and mobility (R26.89);Muscle weakness (generalized) (M62.81)     Time: 2620-3559 PT Time Calculation (min) (ACUTE ONLY): 23 min  Charges:  $Therapeutic Activity: 23-37 mins                    Blondell Reveal Kistler PT 10/09/2019  Acute Rehabilitation Services Pager [phone] Office [phone]

## 2019-10-09 NOTE — Discharge Summary (Addendum)
Physician Discharge Summary  Jordyan Hardiman GMW:[id number] DOB: 19-Jun-1941 DOA: 09/23/2019  PCP: Patient, No Pcp Per  Admit date: 09/23/2019 Discharge date: 10/10/19 Admitted From: Home Disposition:  SNF  Discharge Condition:Stable CODE STATUS:FULL Diet recommendation: Heart Healthy   Brief/Interim Summary:  Patient is a 79 year old female with history of uterine cancer status post hysterectomy who presented with shortness of breath, hemoptysis for 2 weeks, weight loss.  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 Started on heparin drip.  Underwent Video bronchoscopy with endobronchial ultrasound for needle aspiration, brushing, endobronchial biopsy.  Biopsy showed non-small cell lung cancer, metastatic.  Being followed by PCCM, radiation oncology, oncology.  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  She also had opacification of the right hemithorax from reaccumulation of pleural effusion.  Underwent  Pleurx cath placement on 10/01/19.  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 Started on salt tablets and Lasix. She remains hemodynamically stable. She needs to follow-up with oncology, radiation oncology, pulmonary, palliative care as an outpatient.  Due to her advanced malignancy, she has high risks for readmission in the near future.  Following problems were addressed during her hospitalization:  Pulmonary emboli: Presented with dyspnea.  Found to be hypoxic on presentation.  Currently in 2-3 L of oxygen per minute.  Started on heparin drip,we have changed to Eliquis.  Pulmonary emboli is  due to metastatic lung cancer.  Metastatic lung cancer with lymphangitic spread, bone metastasis, malignant effusion of the right pleura: Presented with weight loss, shortness of  breath.  Underwent diagnostic procedure by cardiothoracic team.  Biopsy showed metastatic non-small cell lung cancer( adenocarcinoma).  Oncology, radiation oncology following. CT chest  showed obstruction of the right upper lobe bronchus and resultant collapse/consolidation of the right upper lobe. Persistent right middle lobe occlusion with associated atelectasis. Multiple bilateral hematogenously distributed pulmonary nodules and osseous metastases. CT abd/pelvis showed three  scattered indeterminate hypodense liver masses,largest 2.4 cm in segment 2, suspicious for liver metastases.indeterminate 1.5 cm right adrenal nodule, suspicious for right adrenal metastasis.Widespread sclerotic osseous metastases throughout lumbar spine,sacrum and bilateral pelvic girdle, including in the left femoral neck predisposing to pathologic fracture. Undergoing radiation therapy. Palliative care also following.  Recommended outpatient follow-up with palliative care. She needs to follow-up with oncology on discharge.  Recurrent right pleural effusion: S/p thoracentesis and Pleurx catheter placement.Pleural fluid culture sent on 09/29/19 showed showed pansensitive Ecoli and K Pneumoniae.She was on  ceftriaxone.New culture NGTD.  Pleuro- VAC discontinued.  Antibiotics changed to oral, plan for total of 14 days course.  Severe SVC syndrome: Presented with swelling of face, severe edema of bilateral upper extremities.  Undergoing palliative radiation therapy.  Imaging showed marked stenosis/occlusion of the superior vena cava and marked narrowing of the right pulmonary artery  Hyponatremia: Sodium of 125 today.  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  Started on salt tablets and Lasix.  Check BMP in a week.  Urinary retention: Consistently retained urine and has to be in and out cathed.  Now with a  Foley catheter.  Started  on tamsulosin.  Goals of care: Palliative care consulted.Remains full code.  We  feel that she is a candidate for hospice.Marland Kitchen  PT/OT recommended skilled nursing facility.  We recommend follow-up with palliative care as an outpatient.    Discharge Diagnoses:  Principal Problem:  Pulmonary embolism (HCC) Active Problems:   SVC syndrome   Pleural effusion   Adenocarcinoma of right lung, stage 4 (HCC)   Difficult intravenous access    Discharge Instructions  Discharge Instructions    Diet - low sodium heart healthy   Complete by: As directed    Discharge instructions   Complete by: As directed    1)Please take prescribed medications as instructed. 2)Do a CBC and Bmp tests in a week. 3)Follow up with radiation oncology and oncology.  Name and number of the provider has been attached. 4)You will be called by pulmonology for follow up appointment.   Increase activity slowly   Complete by: As directed      Allergies as of 10/09/2019   No Known Allergies     Medication List    STOP taking these medications   OVER THE COUNTER MEDICATION     TAKE these medications   apixaban 5 MG Tabs tablet Commonly known as: ELIQUIS Take 1 tablet (5 mg total) by mouth 2 (two) times daily.   cephALEXin 500 MG capsule Commonly known as: KEFLEX Take 1 capsule (500 mg total) by mouth every 12 (twelve) hours for 5 days. Start taking on: October 10, 2019   feeding supplement (ENSURE ENLIVE) Liqd Take 237 mLs by mouth 2 (two) times daily between meals.   furosemide 20 MG tablet Commonly known as: LASIX Take 1 tablet (20 mg total) by mouth daily. Start taking on: October 10, 2019   guaiFENesin-dextromethorphan 100-10 MG/5ML syrup Commonly known as: ROBITUSSIN DM Take 10 mLs by mouth every 4 (four) hours as needed for cough.   ipratropium-albuterol 0.5-2.5 (3) MG/3ML Soln Commonly known as: DUONEB Take 3 mLs by nebulization every 4 (four) hours as needed.   multivitamin with minerals Tabs tablet Take 1 tablet by mouth daily. Start taking on: October 10, 2019    senna-docusate 8.6-50 MG tablet Commonly known as: Senokot-S Take 1 tablet by mouth 2 (two) times daily.   sodium chloride 1 g tablet Take 2 tablets (2 g total) by mouth 2 (two) times daily with a meal.   tamsulosin 0.4 MG Caps capsule Commonly known as: FLOMAX Take 1 capsule (0.4 mg total) by mouth daily. Start taking on: October 10, 2019      Follow-up Information    [facility] .   Specialty: Radiology Contact information: [address] [phone] [place] .   Specialty: Cardiology Contact information: [address] [phone] [place]       Curt Bears, MD. Schedule an appointment as soon as possible for a visit in 2 week(s).   Specialty: Oncology Contact information: [place] [address] [phone]          No Known Allergies  Consultations:  PCCM, oncology, radiation oncology, palliative care   Procedures/Studies: DG Chest 2 View  Result Date: 10/05/2019 CLINICAL DATA:  Pleural effusion EXAM: CHEST - 2 VIEW COMPARISON:  Radiograph 10/05/2019 FINDINGS: Redemonstration of an inferiorly directed right chest tube. Tip terminates in the mid to lower right thorax medially. Redemonstration of the partially loculated moderate right pleural effusion with fluid tracking along the fissures. Diffusely increased opacity in the right hemithorax is compatible with passive areas of atelectasis and fluid layering posteriorly. Nodular opacities are again seen in the left lung. Small left effusion is present as well. Visible  portions of the cardiac silhouette are unchanged from prior with a calcified aorta. IMPRESSION: Stable appearance of the chest with moderate right pleural effusion and small left pleural effusion. Stable nodular opacities in the left lung.  Electronically Signed   By: Lovena Le M.D.   On: 10/05/2019 15:54   DG Chest 2 View  Result Date: 09/24/2019 CLINICAL DATA:  Preop evaluation EXAM: CHEST - 2 VIEW COMPARISON:  09/23/2019 FINDINGS: Cardiac shadows within normal limits. Right-sided pleural effusion is noted. Scattered pulmonary nodules are identified consistent with that seen on recent CT examination. Right paratracheal mass is noted consistent with known mass lesion. No acute bony abnormality is seen. IMPRESSION: Stable right-sided pleural effusion as well as pulmonary nodules. Persistent right paratracheal mass is noted as well. Electronically Signed   By: Inez Catalina M.D.   On: 09/24/2019 23:30   CT Head Wo Contrast  Result Date: 09/23/2019 CLINICAL DATA:  Status post trauma. EXAM: CT HEAD WITHOUT CONTRAST TECHNIQUE: Contiguous axial images were obtained from the base of the skull through the vertex without intravenous contrast. COMPARISON:  None. FINDINGS: Brain: There is mild cerebral atrophy with widening of the extra-axial spaces and ventricular dilatation. There are areas of decreased attenuation within the white matter tracts of the supratentorial brain, consistent with microvascular disease changes. Vascular: No hyperdense vessel or unexpected calcification. Skull: Normal. Negative for fracture or focal lesion. Sinuses/Orbits: There is moderate severity right maxillary sinus mucosal thickening. Other: None. IMPRESSION: 1. No acute intracranial abnormality. 2. Mild cerebral atrophy and microvascular disease changes of the supratentorial brain. 3. Moderate severity right maxillary sinus mucosal thickening. The presence of a large polyp versus mucous retention cyst cannot be excluded. Electronically Signed   By: Virgina Norfolk M.D.   On: 09/23/2019 20:38   CT CHEST W CONTRAST  Result Date: 09/29/2019 CLINICAL DATA:  Pneumonia, effusion or abscess suspected. EXAM: CT CHEST WITH CONTRAST TECHNIQUE: Multidetector CT imaging of the  chest was performed during intravenous contrast administration. CONTRAST:  45mL OMNIPAQUE IOHEXOL 300 MG/ML  SOLN COMPARISON:  09/23/2019. FINDINGS: Cardiovascular: Extensive collateral venous flow within the mediastinum and left chest. Occlusion of the SVC. Right pulmonary artery is encased and markedly narrowed. Atherosclerotic calcification of the aorta and aortic valve. Heart size normal. No pericardial effusion. Mediastinum/Nodes: Image quality is degraded by extensive streak artifact patient's arms and collateral venous flow. Ill-defined soft tissue is seen in the subcarinal region, extending along the right hilum with narrowing and encasement of the right middle and right lower lobe bronchi. Lungs/Pleura: Right upper lobe bronchus is now completely obstructed with collapse/consolidation in the right upper lobe, obscuring the previously seen medial right upper lobe mass. Right middle lobe obstruction is again seen with collapse of the right middle lobe. Large right pleural effusion, slightly decreased from 09/23/2019 status post thoracentesis earlier today. Associated collapse/consolidation in the right lower lobe. Small to moderate simple appearing left pleural effusion with compressive atelectasis in the left lower lobe. Multiple hematogenously distributed shaggy bilateral pulmonary nodules, as on 09/23/2019. Upper Abdomen: Altered perfusion within the left hepatic lobe is related to SVC occlusion and extensive collateral venous flow. 1.4 cm low-attenuation lesion in the dome of the liver is difficult to characterize due to size. Visualized portions of the liver and gallbladder are otherwise unremarkable. Thickening of the right adrenal gland. Left adrenal gland is grossly unremarkable. Low-attenuation lesions in the kidneys measure up to 2.4 cm on the right and are likely cysts. 6 mm hyperattenuating lesion in the upper  pole right kidney is too small to characterize. Visualized portions of the spleen and  stomach are grossly unremarkable. Musculoskeletal: Degenerative changes in the spine. There are patchy areas of sclerosis throughout spine, as on the prior exam. IMPRESSION: 1. Right upper lobe/right perihilar mass with interval complete obstruction of the right upper lobe bronchus and resultant collapse/consolidation of the right upper lobe. Persistent right middle lobe occlusion with associated atelectasis. Multiple bilateral hematogenously distributed pulmonary nodules and osseous metastases. Findings are most indicative of stage IV lung cancer. 2. Associated marked stenosis/occlusion of the superior vena cava and marked narrowing of the right pulmonary artery. 3. Large right pleural effusion, slightly decreased from 09/23/2019 after thoracentesis earlier today. No pneumothorax. Associated collapse/consolidation in the right lower lobe. 4. Small to moderate simple left pleural effusion. 5. Lesion in the hepatic dome, better seen on CT abdomen 09/26/2019. 6. Right adrenal thickening, described as a nodule on 09/26/2019. Please refer to that study for further details and discussion. Electronically Signed   By: Lorin Picket M.D.   On: 09/29/2019 16:15   CT Angio Chest PE W/Cm &/Or Wo Cm  Result Date: 09/23/2019 CLINICAL DATA:  Shortness of breath and cough for the past week. Blood tinged sputum. EXAM: CT ANGIOGRAPHY CHEST WITH CONTRAST TECHNIQUE: Multidetector CT imaging of the chest was performed using the standard protocol during bolus administration of intravenous contrast. Multiplanar CT image reconstructions and MIPs were obtained to evaluate the vascular anatomy. CONTRAST:  33mL OMNIPAQUE IOHEXOL 350 MG/ML SOLN COMPARISON:  Chest x-ray from same day. FINDINGS: Cardiovascular: Satisfactory opacification of the pulmonary arteries to the segmental level. Acute segmental pulmonary emboli in the left lower lobe. Severe stenosis of the distal right pulmonary artery. Normal heart size. No pericardial effusion.  Severe stenosis of the SVC with multiple mediastinal and right chest wall collateral veins, and prominent enhancement of the azygos system. No thoracic aortic aneurysm. Mediastinum/Nodes: No enlarged mediastinal, hilar, or axillary lymph nodes. Thyroid gland, trachea, and esophagus demonstrate no significant findings. Lungs/Pleura: Large ill-defined right hilar and suprahilar mass with narrowing of the right upper, middle, and lower lobe bronchi. Complete collapse of the right middle lobe. Perilymphatic nodularity and innumerable small pulmonary nodules in both lungs. Moderate loculated right pleural effusion. Small left pleural effusion. Upper Abdomen: Enhancement of the anterior left hepatic lobe. Punctate nonobstructive left renal calculus. Musculoskeletal: Sclerosis of the lateral right ninth rib and left aspect of the sternal body. Sclerotic lesions involving the T3, T6, T7, T9, T10, and T12 vertebral bodies. Review of the MIP images confirms the above findings. IMPRESSION: 1. Acute segmental pulmonary emboli in the left lower lobe. 2. Widely metastatic lung cancer. Large ill-defined right hilar and suprahilar mass with bilateral pulmonary parenchymal metastases and lymphangitic carcinomatosis. Multiple osseous metastases. 3. The dominant mass results in severe stenosis of the SVC with multiple mediastinal and right chest wall collateral veins, prominent filling of the azygos system and IVC, as well as enhancement of the anterior left hepatic lobe. Correlate for signs and symptoms of SVC syndrome. 4. Similar resultant severe stenosis of the distal right pulmonary artery and narrowing of the right upper, middle, and lower lobe bronchi with complete collapse of the right middle lobe. 5. Moderate loculated right pleural effusion. Small left pleural effusion. Critical Value/emergent results were called by telephone at the time of interpretation on 09/23/2019 at 8:59 pm to provider Claiborne Billings PA, who verbally acknowledged  these results. Electronically Signed   By: Titus Dubin M.D.   On: 09/23/2019 21:08  MR BRAIN W WO CONTRAST  Result Date: 09/26/2019 CLINICAL DATA:  79 year old female with recently diagnosed non-small cell lung cancer. Staging. EXAM: MRI HEAD WITHOUT AND WITH CONTRAST TECHNIQUE: Multiplanar, multiecho pulse sequences of the brain and surrounding structures were obtained without and with intravenous contrast. CONTRAST:  59mL GADAVIST GADOBUTROL 1 MMOL/ML IV SOLN COMPARISON:  Chest CTA 09/23/2019. Head CT 09/23/2019. FINDINGS: Brain: No abnormal enhancement identified. Some postcontrast images are degraded by motion. No midline shift, mass effect, or evidence of intracranial mass lesion. No dural thickening. No restricted diffusion to suggest acute infarction. No ventriculomegaly, extra-axial collection or acute intracranial hemorrhage. Cervicomedullary junction and pituitary are within normal limits. Mild to moderate for age scattered and patchy bilateral cerebral white matter T2 and FLAIR hyperintensity. No cortical encephalomalacia or chronic cerebral blood products. The deep gray matter nuclei, brainstem and cerebellum appear normal for age. Vascular: Major intracranial vascular flow voids are preserved. Major dural venous sinuses are enhancing and appear to be patent. Skull and upper cervical spine: Negative visible cervical spine and spinal cord. Bone marrow signal in the skull is mildly heterogeneous, and there is heterogeneous enhancement in the clivus (series 13, image 11), although no overtly destructive osseous lesion. Sinuses/Orbits: Negative orbits. Right maxillary sinus mucous retention cyst. Other: Visible internal auditory structures appear normal. Mastoids are clear. Scalp and face soft tissues appear negative. IMPRESSION: 1. No acute intracranial abnormality or metastatic disease to the brain. 2. Heterogeneous skull marrow signal, including in the clivus. Osseous metastatic disease not  excluded. 3. Mild to moderate for age cerebral white matter signal changes, most commonly due to chronic small vessel disease. Electronically Signed   By: Genevie Ann M.D.   On: 09/26/2019 11:56   CT ABDOMEN PELVIS W CONTRAST  Result Date: 09/26/2019 CLINICAL DATA:  Inpatient. New diagnosis of metastatic right upper lobe lung cancer. Staging evaluation. Additional history of hysterectomy for uterine cancer. EXAM: CT ABDOMEN AND PELVIS WITH CONTRAST TECHNIQUE: Multidetector CT imaging of the abdomen and pelvis was performed using the standard protocol following bolus administration of intravenous contrast. CONTRAST:  160mL OMNIPAQUE IOHEXOL 300 MG/ML  SOLN COMPARISON:  09/23/2019 chest CT angiogram. FINDINGS: Lower chest: Redemonstration of numerous solid pulmonary nodules scattered throughout both lung bases measuring up to 9 mm in the left lower lobe (series 4/image 26) and 11 mm in the medial right lower lobe (series 4/image 11), not appreciably changed since 09/23/2019 chest CT. Small dependent bilateral pleural effusions, left greater than right, decreased on the right and increased on the left since 09/23/2019 chest CT. Hepatobiliary: Three similar hypodense scattered liver masses measuring 2.4 cm in segment 2 (series 2/image 21), 1.7 cm in segment 3 (series 2/image 30) and 1.6 cm in segment 8 (series 2/image 15). Normal gallbladder with no radiopaque cholelithiasis. No biliary ductal dilatation. Pancreas: Normal, with no mass or duct dilation. Spleen: Normal size. No mass. Adrenals/Urinary Tract: Hypodense 1.5 cm right adrenal nodule (series 2/image 26) with density 34 HU. No discrete left adrenal nodule. Simple 2.6 cm interpolar right renal cyst. Subcentimeter hypodense upper left renal cortical lesion, too small to characterize. No hydronephrosis. Normal bladder. Stomach/Bowel: Normal non-distended stomach. Normal caliber small bowel with no small bowel wall thickening. Normal appendix. Normal large bowel  with no diverticulosis, large bowel wall thickening or pericolonic fat stranding. Vascular/Lymphatic: Atherosclerotic nonaneurysmal abdominal aorta. Patent portal, splenic, hepatic and renal veins. No pathologically enlarged lymph nodes in the abdomen or pelvis. Reproductive: Status post hysterectomy, with no abnormal findings at the vaginal  cuff. No adnexal mass. Other: No pneumoperitoneum, ascites or focal fluid collection. Mild to moderate anasarca. Musculoskeletal: Widespread sclerotic osseous lesions throughout the lumbar spine (most prominent in the L3 vertebral body), left sacrum, bilateral iliac bones, posterior right acetabulum, right pubic bone and left femoral neck. Moderate lumbar spondylosis. IMPRESSION: 1. Three similar scattered indeterminate hypodense liver masses, largest 2.4 cm in segment 2, suspicious for liver metastases. 2. Indeterminate 1.5 cm right adrenal nodule, suspicious for right adrenal metastasis. 3. Widespread sclerotic osseous metastases throughout lumbar spine, sacrum and bilateral pelvic girdle, including in the left femoral neck predisposing to pathologic fracture. 4. Redemonstration of widespread pulmonary metastases at both lung bases. Small dependent left pleural effusion has increased. Small dependent right pleural effusion has decreased. 5.  Aortic Atherosclerosis (ICD10-I70.0). Electronically Signed   By: Ilona Sorrel M.D.   On: 09/26/2019 19:19   DG Chest Port 1 View  Result Date: 10/05/2019 CLINICAL DATA:  79 year old female with history of uterine cancer status post hysterectomy presenting with shortness of breath and hemoptysis for the past 2 weeks with some weight loss. EXAM: PORTABLE CHEST 1 VIEW COMPARISON:  Chest x-ray 10/02/2019. FINDINGS: Right-sided chest tube with tip in the medial aspect of the mid right hemithorax. Large right pleural effusion which remains partially loculated, with the largest component in the apex of the right hemithorax. Multiple  pulmonary nodules noted throughout the left lung. Small left pleural effusion. No evidence of pulmonary edema. Heart size is upper limits of normal. Masslike area in the right hilar region. Aortic atherosclerosis. IMPRESSION: 1. Support apparatus, as above. 2. Known right upper lobe perihilar mass redemonstrated with persistent large partially loculated right pleural effusion, stable compared to prior examinations. 3. Small left pleural effusion. 4. Multiple pulmonary nodules in the left lung, concerning for metastatic disease. Electronically Signed   By: Vinnie Langton M.D.   On: 10/05/2019 14:29   DG CHEST PORT 1 VIEW  Result Date: 10/02/2019 CLINICAL DATA:  Pleural effusion.  Chest tube. EXAM: PORTABLE CHEST 1 VIEW COMPARISON:  10/01/2019.  CT 09/29/2019 and 09/23/2019. FINDINGS: Right chest tube again noted. Continued improvement of right pleural effusion. Persistent right upper lung mass atelectatic changes. Persistent pulmonary nodules. Reference is made to CT report. Tiny left pleural effusion. No pneumothorax. IMPRESSION: 1. Right chest tube in stable position. Continued improvement of right pleural effusion. No pneumothorax. 2. Persistent right upper lung mass and atelectatic changes. Pulmonary nodules again noted. Small left pleural effusion. Electronically Signed   By: Marcello Moores  Register   On: 10/02/2019 08:35   DG Chest Port 1 View  Result Date: 10/01/2019 CLINICAL DATA:  Status post right chest tube placement. EXAM: PORTABLE CHEST 1 VIEW COMPARISON:  Single-view of the chest 09/30/2019. FINDINGS: New small bore right chest tube is in place. Right pleural effusion is decreased there is improved in aeration in the right chest. No pneumothorax. Very small left pleural effusion noted. Heart size is normal. Atherosclerosis is seen. IMPRESSION: Decreased right pleural effusion and improved aeration in the right chest after chest tube placement. Negative for pneumothorax. No new abnormality.  Electronically Signed   By: Inge Rise M.D.   On: 10/01/2019 14:15   DG Chest Port 1 View  Result Date: 09/30/2019 CLINICAL DATA:  History of uterine cancer. EXAM: PORTABLE CHEST 1 VIEW COMPARISON:  CT of 1 day prior.  Chest radiograph of 1 day prior. FINDINGS: Patient rotated right. Normal heart size. Atherosclerosis in the transverse aorta. No pneumothorax. Near complete whiteout of the right  hemithorax, significantly worsened aeration since the radiograph 1 day prior. Left-sided pulmonary nodules. IMPRESSION: Significantly worsened right-sided aeration, with near complete whiteout. Likely a combination of increasing pleural fluid and collapse, superimposed upon underlying mass as detailed on CT. Left-sided pulmonary nodules/metastasis, as before. Aortic Atherosclerosis (ICD10-I70.0). These results will be called to the ordering clinician or representative by the Radiologist Assistant, and communication documented in the PACS or zVision Dashboard. Electronically Signed   By: Abigail Miyamoto M.D.   On: 09/30/2019 13:55   DG Chest Port 1 View  Result Date: 09/29/2019 CLINICAL DATA:  Right pleural effusion. Status post thoracentesis. Metastatic lung cancer. Pulmonary emboli. EXAM: PORTABLE CHEST 1 VIEW 1:01 p.m. COMPARISON:  09/29/2019 at 11:01 a.m. and CT scan of the chest dated 09/23/2019 FINDINGS: There has been a significant decrease in the large right pleural effusion with re-aeration of portions of the right lung after thoracentesis. No pneumothorax. Moderate residual right pleural effusion. Multiple small metastatic nodules are noted throughout the left lung. Much more apparent on the CT scan of 09/23/2019. Heart size and pulmonary vascularity are normal. Aortic atherosclerosis. IMPRESSION: 1. No pneumothorax after thoracentesis. 2. Decrease in the large right pleural effusion. 3. Residual moderate right pleural effusion. 4. Multiple metastatic nodules throughout the left lung. 5.  Aortic  Atherosclerosis (ICD10-I70.0). Electronically Signed   By: Lorriane Shire M.D.   On: 09/29/2019 13:16   DG CHEST PORT 1 VIEW  Result Date: 09/29/2019 CLINICAL DATA:  Shortness of breath, uterine cancer, adenocarcinoma RIGHT lung stage IV EXAM: PORTABLE CHEST 1 VIEW COMPARISON:  Portable exam 1101 hours compared to 09/26/2019 FINDINGS: Normal heart size and mediastinal contours. Atherosclerotic calcification aorta. Complete opacification of the RIGHT hemithorax new since the previous exam likely representing a combination of pleural effusion and atelectasis. Possibility of mucous plugging raised due to the rapid interval change since 09/26/2019. Mild atelectasis and tiny effusion at LEFT base. Questionable nodular density 8 mm diameter lower LEFT lung. No pneumothorax or acute osseous findings. IMPRESSION: Complete opacification of RIGHT hemithorax new since 09/26/2019 likely a combination of pleural effusion and atelectasis question mucous plugging. Atelectasis and tiny effusion at LEFT lung base with questionable 8 mm lung nodule lower LEFT lung. Electronically Signed   By: Lavonia Dana M.D.   On: 09/29/2019 11:20   DG Chest Port 1 View  Result Date: 09/26/2019 CLINICAL DATA:  Status post right thoracentesis EXAM: PORTABLE CHEST 1 VIEW COMPARISON:  09/25/2019 FINDINGS: Cardiac shadow is stable. Aortic calcifications are again seen. Soft tissue density is noted in the right paratracheal region consistent with the patient's known history of lung carcinoma. Scattered nodular changes are noted within the left lung. Interval decrease in right-sided pleural effusion is noted. No pneumothorax is seen. IMPRESSION: No pneumothorax is noted. Changes consistent with the known history of lung carcinoma. Electronically Signed   By: Inez Catalina M.D.   On: 09/26/2019 17:03   DG Chest Port 1 View  Result Date: 09/25/2019 CLINICAL DATA:  Status post bronchoscopy and biopsies today. EXAM: PORTABLE CHEST 1 VIEW COMPARISON:   PA and lateral chest earlier today. CT chest 09/23/2019. FINDINGS: There is no pneumothorax after bronchoscopy. Right much greater than left pleural effusions again seen. Right suprahilar mass and multiple bilateral pulmonary nodules are again seen. Heart size is normal. Atherosclerosis noted. IMPRESSION: Negative for pneumothorax after bronchoscopy. No change in right suprahilar mass, right greater than left pleural effusions and multiple pulmonary nodules. Electronically Signed   By: Inge Rise M.D.   On:  09/25/2019 10:09   DG Chest Portable 1 View  Result Date: 09/23/2019 CLINICAL DATA:  Shortness of breath and cough for 1 week, productive blood-tinged sputum EXAM: PORTABLE CHEST 1 VIEW COMPARISON:  None FINDINGS: There is a complex right pleural effusion tracking within the fissures and along the lung apex, worrisome for loculation. Adjacent opacity likely reflect some passive atelectasis though an underlying consolidative processes not excluded. No pneumothorax. Left lung is essentially clear. The aorta is calcified. Right heart border is obscured by adjacent opacity. Remaining cardiomediastinal contours are unremarkable. Degenerative changes are present in the imaged spine and shoulders. No acute osseous or soft tissue abnormality. IMPRESSION: 1. Findings worrisome for complex right pleural effusion. Underlying consolidative processes not excluded. Could consider CT chest with contrast for further characterization. Electronically Signed   By: Lovena Le M.D.   On: 09/23/2019 05:36   ECHOCARDIOGRAM COMPLETE  Result Date: 09/25/2019   ECHOCARDIOGRAM REPORT   Patient Name:   LANINA LARRANAGA Date of Exam: 09/25/2019 Medical Rec #:  [id number]     Height:       67.0 in Accession #:    [id number]    Weight:       185.4 lb Date of Birth:  1941/06/16     BSA:          1.96 m Patient Age:    62 years      BP:           142/82 mmHg Patient Gender: F             HR:           90 bpm. Exam Location:  Inpatient  Procedure: 2D Echo, Color Doppler and Cardiac Doppler Indications:    I26.02 Pulmonary embolus  History:        Patient has no prior history of Echocardiogram examinations. SVC                 Syndrome.  Sonographer:    Raquel Sarna Senior RDCS Referring Phys: [place] Comments: Technically difficult study due to poor echo windows. IMPRESSIONS  1. Left ventricular ejection fraction, by visual estimation, is 55 to 60%. The left ventricle has normal function. There is no left ventricular hypertrophy.  2. Left ventricular diastolic parameters are indeterminate.  3. The left ventricle has no regional wall motion abnormalities.  4. Global right ventricle has normal systolic function.The right ventricular size is normal. No increase in right ventricular wall thickness.  5. Left atrial size was normal.  6. Right atrial size was normal.  7. Moderate pleural effusion in both left and right lateral regions.  8. Presence of pericardial fat pad.  9. Trivial pericardial effusion is present. 10. Mild mitral annular calcification. 11. The mitral valve is normal in structure. Trivial mitral valve regurgitation. 12. The tricuspid valve is normal in structure. 13. The aortic valve is tricuspid. Aortic valve regurgitation is not visualized. Mild aortic valve sclerosis without stenosis. 14. The pulmonic valve was grossly normal. Pulmonic valve regurgitation is trivial. 15. Moderately elevated pulmonary artery systolic pressure. 16. The inferior vena cava is normal in size with greater than 50% respiratory variability, suggesting right atrial pressure of 3 mmHg. 17. Technically difficult study due to limited echo windows. No gross abnormalities seen, but reduced sensitivity due to technical limitations. FINDINGS  Left Ventricle: Left ventricular ejection fraction, by visual estimation, is 55 to 60%. The left ventricle has normal function. The left ventricle has no regional wall motion  abnormalities. There is no  left ventricular hypertrophy. Left ventricular diastolic parameters are indeterminate. Right Ventricle: The right ventricular size is normal. No increase in right ventricular wall thickness. Global RV systolic function is has normal systolic function. The tricuspid regurgitant velocity is 3.19 m/s, and with an assumed right atrial pressure  of 3 mmHg, the estimated right ventricular systolic pressure is moderately elevated at 43.7 mmHg. Left Atrium: Left atrial size was normal in size. Right Atrium: Right atrial size was normal in size Pericardium: Trivial pericardial effusion is present. Presence of pericardial fat pad. There is a moderate pleural effusion in both left and right lateral regions. Mitral Valve: The mitral valve is normal in structure. There is mild thickening of the mitral valve leaflet(s). There is mild calcification of the mitral valve leaflet(s). Mild mitral annular calcification. Trivial mitral valve regurgitation. Tricuspid Valve: The tricuspid valve is normal in structure. Tricuspid valve regurgitation is mild. Aortic Valve: The aortic valve is tricuspid. . There is mild thickening and mild calcification of the aortic valve. Aortic valve regurgitation is not visualized. Mild aortic valve sclerosis is present, with no evidence of aortic valve stenosis. There is mild thickening of the aortic valve. There is mild calcification of the aortic valve. Pulmonic Valve: The pulmonic valve was grossly normal. Pulmonic valve regurgitation is trivial. Pulmonic regurgitation is trivial. Aorta: The aortic root, ascending aorta and aortic arch are all structurally normal, with no evidence of dilitation or obstruction. Pulmonary Artery: The pulmonary artery is not well seen. Venous: The inferior vena cava is normal in size with greater than 50% respiratory variability, suggesting right atrial pressure of 3 mmHg. IAS/Shunts: No atrial level shunt detected by color flow Doppler.  LEFT VENTRICLE PLAX 2D LVIDd:          3.90 cm  Diastology LVIDs:         2.80 cm  LV e' lateral:   5.66 cm/s LV PW:         0.90 cm  LV E/e' lateral: 9.2 LV IVS:        1.10 cm  LV e' medial:    4.35 cm/s LVOT diam:     1.90 cm  LV E/e' medial:  12.0 LV SV:         36 ml LV SV Index:   18.04 LVOT Area:     2.84 cm  RIGHT VENTRICLE RV S prime:     18.10 cm/s TAPSE (M-mode): 1.6 cm LEFT ATRIUM             Index       RIGHT ATRIUM           Index LA diam:        2.60 cm 1.33 cm/m  RA Area:     11.30 cm LA Vol (A2C):   23.8 ml 12.15 ml/m RA Volume:   24.30 ml  12.41 ml/m LA Vol (A4C):   34.9 ml 17.82 ml/m LA Biplane Vol: 29.6 ml 15.12 ml/m  AORTIC VALVE LVOT Vmax:   86.80 cm/s LVOT Vmean:  65.000 cm/s LVOT VTI:    0.167 m  AORTA Ao Root diam: 2.50 cm MITRAL VALVE                        TRICUSPID VALVE MV Area (PHT): 2.87 cm             TR Peak grad:   40.7 mmHg MV PHT:  76.56 msec           TR Vmax:        319.00 cm/s MV Decel Time: 264 msec MV E velocity: 52.30 cm/s 103 cm/s  SHUNTS MV A velocity: 59.60 cm/s 70.3 cm/s Systemic VTI:  0.17 m MV E/A ratio:  0.88       1.5       Systemic Diam: 1.90 cm  Buford Dresser MD Electronically signed by Buford Dresser MD Signature Date/Time: 09/25/2019/7:01:47 PM    Final    US THORACENTESIS ASP PLEURAL SPACE W/IMG GUIDE  Result Date: 09/29/2019 INDICATION: Patient with history of metastatic lung cancer, recurrent right pleural effusion status post thoracentesis 09/26/2019 yielding 900 mL pleural fluid. New complete opacification right hemithorax seen on chest x-ray today. Request to IR for repeat diagnostic and therapeutic right-sided thoracentesis. EXAM: ULTRASOUND GUIDED RIGHT THORACENTESIS MEDICATIONS: 10 mL 1% lidocaine COMPLICATIONS: None immediate. PROCEDURE: An ultrasound guided thoracentesis was thoroughly discussed with the patient and questions answered. The benefits, risks, alternatives and complications were also discussed. The patient understands and wishes to proceed  with the procedure. Written consent was obtained. Ultrasound was performed to localize and mark an adequate pocket of fluid in the right chest. The area was then prepped and draped in the normal sterile fashion. 1% Lidocaine was used for local anesthesia. Under ultrasound guidance a 6 Fr Safe-T-Centesis catheter was introduced. Thoracentesis was performed. The catheter was removed and a dressing applied. FINDINGS: A total of approximately 600 mL of clear, light amber fluid was removed. Samples were sent to the laboratory as requested by the clinical team. IMPRESSION: Successful ultrasound guided right thoracentesis yielding 600 mL of pleural fluid. Read by Candiss Norse, PA-C Electronically Signed   By: Jacqulynn Cadet M.D.   On: 09/29/2019 13:20   US THORACENTESIS ASP PLEURAL SPACE W/IMG GUIDE  Result Date: 09/26/2019 INDICATION: Patient with history of metastatic lung cancer, right pleural effusion. Request made for diagnostic and therapeutic right thoracentesis. EXAM: ULTRASOUND GUIDED DIAGNOSTIC AND THERAPEUTIC RIGHT THORACENTESIS MEDICATIONS: None COMPLICATIONS: None immediate. PROCEDURE: An ultrasound guided thoracentesis was thoroughly discussed with the patient and questions answered. The benefits, risks, alternatives and complications were also discussed. The patient understands and wishes to proceed with the procedure. Written consent was obtained. Ultrasound was performed to localize and mark an adequate pocket of fluid in the right chest. The area was then prepped and draped in the normal sterile fashion. 1% Lidocaine was used for local anesthesia. Under ultrasound guidance a 6 Fr Safe-T-Centesis catheter was introduced. Thoracentesis was performed. The catheter was removed and a dressing applied. FINDINGS: A total of approximately 900 cc of slightly hazy, amber fluid was removed. Samples were sent to the laboratory as requested by the clinical team. IMPRESSION: Successful ultrasound guided  diagnostic and therapeutic right thoracentesis yielding 900 cc of pleural fluid. Read by: Rowe Robert, PA-C Electronically Signed   By: Aletta Edouard M.D.   On: 09/26/2019 16:38       Subjective: Patient seen and examined at the bedside this morning.  Hemodynamically stable.  On 2 L of oxygen per minute.  Denies any shortness of breath or chest pain.  Hemodynamically stable for discharge today.  Discharge Exam: Vitals:   10/09/19 1040 10/09/19 1215  BP: (!) 143/80 (!) 119/56  Pulse: 98 99  Resp:  18  Temp:  98.2 F (36.8 C)  SpO2:  96%   Vitals:   10/09/19 0500 10/09/19 0533 10/09/19 1040 10/09/19 1215  BP:  109/61 (!) 143/80 (!) 119/56  Pulse:  97 98 99  Resp:  20  18  Temp:  98 F (36.7 C)  98.2 F (36.8 C)  TempSrc:  Oral  Oral  SpO2:  100%  96%  Weight: 91.1 kg     Height:        General: Pt is alert, awake, not in acute distress Cardiovascular: RRR, S1/S2 +, no rubs, no gallops Respiratory: Decreased air entry in the lung bases  abdominal: Soft, NT, ND, bowel sounds + Extremities: Bilateral upper extremity edema, no cyanosis    The results of significant diagnostics from this hospitalization (including imaging, microbiology, ancillary and laboratory) are listed below for reference.     Microbiology: Recent Results (from the past 240 hour(s))  MRSA PCR Screening     Status: None   Collection Time: 09/30/19  5:17 PM   Specimen: Nasopharyngeal  Result Value Ref Range Status   MRSA by PCR NEGATIVE NEGATIVE Final    Comment:        The GeneXpert MRSA Assay (FDA approved for NASAL specimens only), is one component of a comprehensive MRSA colonization surveillance program. It is not intended to diagnose MRSA infection nor to guide or monitor treatment for MRSA infections. Performed at [facility], [place] [address], [place], [place] [address]   Body fluid culture     Status: None   Collection Time: 10/01/19  4:09 PM   Specimen:  Pleural, Right; Body Fluid  Result Value Ref Range Status   Specimen Description   Final    Pleural R Performed at [place] [address], [place], [place] [address]    Special Requests   Final    NONE Performed at [facility], [place] [address], [place], [place] [address]    Gram Stain   Final    MODERATE WBC PRESENT,BOTH PMN AND MONONUCLEAR NO ORGANISMS SEEN    Culture   Final    NO GROWTH 3 DAYS Performed at [facility], [place] [address] [place], [place] [address]    Report Status 10/05/2019 FINAL  Final  SARS CORONAVIRUS 2 (TAT 6-24 HRS) Nasopharyngeal Nasopharyngeal Swab     Status: None   Collection Time: 10/07/19  5:31 AM   Specimen: Nasopharyngeal Swab  Result Value Ref Range Status   SARS Coronavirus 2 NEGATIVE NEGATIVE Final    Comment: (NOTE) SARS-CoV-2 target nucleic acids are NOT DETECTED. The SARS-CoV-2 RNA is generally detectable in upper and lower respiratory specimens during the acute phase of infection. Negative results do not preclude SARS-CoV-2 infection, do not rule out co-infections with other pathogens, and should not be used as the sole basis for treatment or other patient management decisions. Negative results must be combined with clinical observations, patient history, and epidemiological information. The expected result is Negative. Fact Sheet for Patients: SugarRoll.be Fact Sheet for Healthcare Providers: https://www.woods-mathews.com/ This test is not yet approved or cleared by the [place] FDA and  has been authorized for detection and/or diagnosis of SARS-CoV-2 by FDA under an Emergency Use Authorization (EUA). This EUA will remain  in effect (meaning this test can be used) for the duration of the COVID-19 declaration under Section 56 4(b)(1) of the Act, 21 U.S.C. section 360bbb-3(b)(1), unless the authorization is terminated or revoked  sooner. Performed at [facility], [place] [address], [place],  [address]      Labs: BNP (last 3 results) No results for input(s): BNP  in the last 8760 hours. Basic Metabolic Panel: Recent Labs  Lab 10/05/19 0146 10/08/19 0108 10/09/19 0316 10/09/19 1218  NA 129* 126* 124* 125*  K 4.5 4.6 4.8  --   CL 94* 89* 87*  --   CO2 28 30 28   --   GLUCOSE 105* 127* 109*  --   BUN 20 14 13   --   CREATININE 0.55 0.51 0.46  --   CALCIUM 8.6* 8.8* 8.4*  --    Liver Function Tests: No results for input(s): AST, ALT, ALKPHOS, BILITOT, PROT, ALBUMIN in the last 168 hours. No results for input(s): LIPASE, AMYLASE in the last 168 hours. No results for input(s): AMMONIA in the last 168 hours. CBC: Recent Labs  Lab 10/03/19 0142 10/04/19 0105 10/06/19 0210 10/07/19 0121  WBC 9.8 8.6 8.6 9.9  HGB 9.0* 8.4* 8.1* 8.8*  HCT 29.0* 27.9* 26.8* 28.0*  MCV 84.5 85.1 85.4 83.6  PLT 204 217 251 224   Cardiac Enzymes: No results for input(s): CKTOTAL, CKMB, CKMBINDEX, TROPONINI in the last 168 hours. BNP: Invalid input(s): POCBNP CBG: No results for input(s): GLUCAP in the last 168 hours. D-Dimer No results for input(s): DDIMER in the last 72 hours. Hgb A1c No results for input(s): HGBA1C in the last 72 hours. Lipid Profile No results for input(s): CHOL, HDL, LDLCALC, TRIG, CHOLHDL, LDLDIRECT in the last 72 hours. Thyroid function studies No results for input(s): TSH, T4TOTAL, T3FREE, THYROIDAB in the last 72 hours.  Invalid input(s): FREET3 Anemia work up No results for input(s): VITAMINB12, FOLATE, FERRITIN, TIBC, IRON, RETICCTPCT in the last 72 hours. Urinalysis No results found for: COLORURINE, APPEARANCEUR, [place], [place], [place], [place], [place], [place], PROTEINUR, UROBILINOGEN, NITRITE, LEUKOCYTESUR Sepsis Labs Invalid input(s): PROCALCITONIN,  WBC,  LACTICIDVEN Microbiology Recent Results (from the past 240 hour(s))  MRSA PCR Screening     Status: None    Collection Time: 09/30/19  5:17 PM   Specimen: Nasopharyngeal  Result Value Ref Range Status   MRSA by PCR NEGATIVE NEGATIVE Final    Comment:        The GeneXpert MRSA Assay (FDA approved for NASAL specimens only), is one component of a comprehensive MRSA colonization surveillance program. It is not intended to diagnose MRSA infection nor to guide or monitor treatment for MRSA infections. Performed at [facility], [place] [address], [place], [place] [address]   Body fluid culture     Status: None   Collection Time: 10/01/19  4:09 PM   Specimen: Pleural, Right; Body Fluid  Result Value Ref Range Status   Specimen Description   Final    Pleural R Performed at [place] [address] [place], [place] [address]    Special Requests   Final    NONE Performed at [facility], [place] [address] [place], [place] [address]    Gram Stain   Final    MODERATE WBC PRESENT,BOTH PMN AND MONONUCLEAR NO ORGANISMS SEEN    Culture   Final    NO GROWTH 3 DAYS Performed at [facility], [place] [address] [place], [place] [address]    Report Status 10/05/2019 FINAL  Final  SARS CORONAVIRUS 2 (TAT 6-24 HRS) Nasopharyngeal Nasopharyngeal Swab     Status: None   Collection Time: 10/07/19  5:31 AM   Specimen: Nasopharyngeal Swab  Result Value Ref Range Status   SARS Coronavirus 2 NEGATIVE NEGATIVE Final    Comment: (NOTE) SARS-CoV-2 target nucleic acids are NOT  DETECTED. The SARS-CoV-2 RNA is generally detectable in upper and lower respiratory specimens during the acute phase of infection. Negative results do not preclude SARS-CoV-2 infection, do not rule out co-infections with other pathogens, and should not be used as the sole basis for treatment or other patient management decisions. Negative results must be combined with clinical observations, patient history, and epidemiological information. The expected result is  Negative. Fact Sheet for Patients: SugarRoll.be Fact Sheet for Healthcare Providers: https://www.woods-mathews.com/ This test is not yet approved or cleared by the [place] FDA and  has been authorized for detection and/or diagnosis of SARS-CoV-2 by FDA under an Emergency Use Authorization (EUA). This EUA will remain  in effect (meaning this test can be used) for the duration of the COVID-19 declaration under Section 56 4(b)(1) of the Act, 21 U.S.C. section 360bbb-3(b)(1), unless the authorization is terminated or revoked sooner. Performed at [facility], [place] [address] [place], [place] [address]     Please note: You were cared for by a hospitalist during your hospital stay. Once you are discharged, your primary care physician will handle any further medical issues. Please note that NO REFILLS for any discharge medications will be authorized once you are discharged, as it is imperative that you return to your primary care physician (or establish a relationship with a primary care physician if you do not have one) for your post hospital discharge needs so that they can reassess your need for medications and monitor your lab values.    Time coordinating discharge: 40 minutes  SIGNED:   Shelly Coss, MD  [facility] 10/09/2019, 1:30 PM Pager [phone]  If 7PM-7AM, please contact night-coverage [email or web address] Password [email or web address]

## 2019-10-09 NOTE — Progress Notes (Signed)
Dressing to Pleurx is clean dry intact. Unable to view.

## 2019-10-10 ENCOUNTER — Ambulatory Visit
Admit: 2019-10-10 | Discharge: 2019-10-10 | Disposition: A | Discharge status: Home / Self Care | Payer: Medicare Other | Attending: Radiation Oncology | Admitting: Radiation Oncology

## 2019-10-10 LAB — SODIUM: Sodium: 125 mmol/L — ABNORMAL LOW (ref 135–145)

## 2019-10-10 NOTE — NC FL2 (Signed)
Emily Kim LEVEL OF CARE SCREENING TOOL     IDENTIFICATION  Patient Name: Emily Kim Birthdate: July 05, 1941 Sex: female Admission Date (Current Location): 09/23/2019  [facility] and [place] Number:  Herbalist and Address:  [facility],  [place] [address], [place]      Provider Number: [id number]  Attending Physician Name and Address:  Shelly Coss, MD  Relative Name and Phone Number:  [name] son [phone]    Current Level of Care: Hospital Recommended Level of Care: [place] Prior Approval Number:    Date Approved/Denied:   PASRR Number: [id number]  Discharge Plan: SNF    Current Diagnoses: Patient Active Problem List   Diagnosis Date Noted  . Difficult intravenous access   . Adenocarcinoma of right lung, stage 4 ([place])   . Pulmonary embolism ([place]) 09/23/2019  . 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 Pleural effusion 09/23/2019    Orientation RESPIRATION BLADDER Height & Weight     Self, Time, Situation, Place  O2(3L) Continent Weight: 199 lb 8 oz (90.5 kg) Height:  5\' 7"  (170.2 cm)  BEHAVIORAL SYMPTOMS/MOOD NEUROLOGICAL BOWEL NUTRITION STATUS      Continent Diet(reglar)  AMBULATORY STATUS COMMUNICATION OF NEEDS Skin   Extensive Assist Verbally Normal                       Personal Care Assistance Level of Assistance  Bathing, Feeding, Dressing Bathing Assistance: Limited assistance Feeding assistance: Limited assistance Dressing Assistance: Limited assistance     Functional Limitations Info  Sight, Hearing, Speech Sight Info: Impaired Hearing Info: Adequate Speech Info: Adequate    SPECIAL CARE FACTORS FREQUENCY  PT (By licensed PT), OT (By licensed OT)     PT Frequency: 5x weekly OT Frequency: 5xweekly            Contractures Contractures Info: Not present    Additional Factors Info  Code Status Code Status Info: full             Current Medications (10/10/2019):   This is the current hospital active medication list Current Facility-Administered Medications  Medication Dose Route Frequency Provider Last Rate Last Admin  . acetaminophen (TYLENOL) tablet 650 mg  650 mg Oral Q6H PRN Erick Colace, NP       Or  . acetaminophen (TYLENOL) suppository 650 mg  650 mg Rectal Q6H PRN Erick Colace, NP      . apixaban (ELIQUIS) tablet 5 mg  5 mg Oral BID Leodis Sias T, RPH   5 mg at 10/10/19 1038  . cephALEXin (KEFLEX) capsule 500 mg  500 mg Oral Q12H Adhikari, Amrit, MD   500 mg at 10/10/19 1038  . Chlorhexidine Gluconate Cloth 2 % PADS 6 each  6 each Topical Daily Erick Colace, NP   6 each at 10/10/19 1039  . diclofenac Sodium (VOLTAREN) 1 % topical gel 2 g  2 g Topical QID Shelly Coss, MD   2 g at 10/10/19 1038  . feeding supplement (ENSURE ENLIVE) (ENSURE ENLIVE) liquid 237 mL  237 mL Oral BID BM Erick Colace, NP   237 mL at 10/10/19 1046  . feeding supplement (PRO-STAT SUGAR FREE 64) liquid 30 mL  30 mL Oral BID Erick Colace, NP   30 mL at 10/07/19 1636  . furosemide (LASIX) tablet 20 mg  20 mg Oral Daily Shelly Coss, MD   20 mg at 10/10/19 1038  . guaiFENesin (  MUCINEX) 12 hr tablet 600 mg  600 mg Oral BID Erick Colace, NP   600 mg at 10/10/19 1038  . guaiFENesin-dextromethorphan (ROBITUSSIN DM) 100-10 MG/5ML syrup 10 mL  10 mL Oral Q4H PRN Erick Colace, NP   10 mL at 10/04/19 0059  . hydrocerin (EUCERIN) cream   Topical BID Erick Colace, NP   Given at 10/10/19 1038  . HYDROcodone-acetaminophen (NORCO/VICODIN) 5-325 MG per tablet 1 tablet  1 tablet Oral Q6H PRN Erick Colace, NP   1 tablet at 10/01/19 2035  . ipratropium-albuterol (DUONEB) 0.5-2.5 (3) MG/3ML nebulizer solution 3 mL  3 mL Nebulization Q4H PRN Erick Colace, NP   3 mL at 09/24/19 2248  . MEDLINE mouth rinse  15 mL Mouth Rinse BID Shelly Coss, MD   15 mL at 10/10/19 1039  . multivitamin with minerals tablet 1 tablet  1 tablet Oral Daily Erick Colace, NP   1 tablet at 10/10/19 1037  . ondansetron (ZOFRAN) injection 4 mg  4 mg Intravenous Q6H PRN Erick Colace, NP      . senna-docusate (Senokot-S) tablet 1 tablet  1 tablet Oral BID Erick Colace, NP   1 tablet at 10/10/19 1037  . sodium chloride tablet 2 g  2 g Oral BID WC Shelly Coss, MD   2 g at 10/10/19 0810  . tamsulosin (FLOMAX) capsule 0.4 mg  0.4 mg Oral Daily Shelly Coss, MD   0.4 mg at 10/10/19 1038     Discharge Medications: Please see discharge summary for a list of discharge medications.  Relevant Imaging Results:  Relevant Lab Results:   Additional Information SS# [id number]  Ross Ludwig, LCSW

## 2019-10-10 NOTE — TOC Transition Note (Signed)
Transition of Care [facility]) - CM/SW Discharge Note   Patient Details  Name: Emily Kim MRN: [id number] Date of Birth: 06/20/1941  [facility] CM/SW Contact:  Ross Ludwig, LCSW Phone Number: 10/10/2019, 12:30 PM   Clinical Narrative:     CSW spoke to patient's son Emily Kim to discuss bed offers, patient's son chose [facility] for short term rehab.  CSW contacted [facility] and spoke to [place] and she said she can accept patient today pending insurance authorization. CSW contacted insurance company and began Biochemist, clinical.  CSW spoke to [employer], reference number [id number].  Once patient has been approved, she will go to room 111A.    Patient to be d/c'ed today to [facility] room 111A.  Patient and family agreeable to plans will transport via ems RN to call report [phone].      Final next level of care: Skilled Nursing Facility Barriers to Discharge: Insurance Authorization   Patient Goals and CMS Choice Patient states their goals for this hospitalization and ongoing recovery are:: To go to SNF for short term rehab, then return back home. CMS Medicare.gov Compare Post Acute Care list provided to:: Patient Represenative (must comment) Choice offered to / list presented to : Adult Children  Discharge Placement   Existing PASRR number confirmed : 10/01/19          Patient [facility] bed at: [facility] Patient to be transferred to facility by: [facility] Name of family member notified: Patient's son Emily Kim Patient and family notified of of transfer: 10/10/19  Discharge Plan and Services   Discharge Planning Services: CM Consult Post Acute Care Choice: [place]          DME Arranged: N/A         HH Arranged: NA          Social Determinants of Health (SDOH) Interventions     Readmission Risk Interventions No flowsheet data found.

## 2019-10-10 NOTE — Progress Notes (Signed)
PROGRESS NOTE    Emily Kim  VCB:[id number] DOB: 1941/05/21 DOA: 09/23/2019 PCP: Patient, No Pcp Per   Brief Narrative:  Patient is a 79 year old female with history of uterine cancer status post hysterectomy who presented with shortness of breath, hemoptysis for 2 weeks, weight loss. 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 Started on heparin drip. Underwent Video bronchoscopy with endobronchial ultrasound for needle aspiration, brushing, endobronchial biopsy. Biopsy showed non-small cell lung cancer, metastatic. Being followed by PCCM, radiation oncology, oncology. 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 She also had opacification of the right hemithorax from reaccumulation of pleural effusion. 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 Started on salt tablets and Lasix. She remains hemodynamically stable. She needs to follow-up with oncology, radiation oncology, pulmonary, palliative care as an outpatient.  Due to her advanced malignancy, she has high risks for readmission in the near future.  Assessment & Plan:   Pulmonary emboli:Presented with dyspnea. Found to be hypoxic on presentation. Currently in 2-3 L of oxygen per minute. Started on heparin drip,we have changed to Eliquis. Pulmonary emboli is due to metastatic lung cancer.  Metastatic lung cancer with lymphangitic spread, bone metastasis, malignant effusion of the right pleura: Presented with weight loss, shortness of breath. Underwent diagnostic procedure by cardiothoracic team. Biopsy showed metastatic non-small cell lung cancer( adenocarcinoma). Oncology, radiation oncology following. CT chest  showed obstruction of the right upper lobe  bronchus and resultant collapse/consolidation of the right upper lobe. Persistent right middle lobe occlusion with associated atelectasis. Multiple bilateral hematogenously distributed pulmonary nodules and osseous metastases. CT abd/pelvis showed three scattered indeterminate hypodense liver masses,largest 2.4 cm in segment 2, suspicious for liver metastases.indeterminate 1.5 cm right adrenal nodule, suspicious for right adrenal metastasis.Widespread sclerotic osseous metastases throughout lumbar spine,sacrum and bilateral pelvic girdle, including in the left femoral neck predisposing to pathologic fracture. Undergoing radiation therapy. Palliative care also following.  Recommended outpatient follow-up with palliative care. She needs to follow-up with oncology on discharge.  Recurrent right pleural effusion:S/p thoracentesis and Pleurx catheter placement.Pleural fluid culture sent on 09/29/19 showed showed pansensitive Ecoli and K Pneumoniae.She was on ceftriaxone.New culture NGTD. Pleuro- VAC discontinued. Antibiotics changed to oral, plan for total of 14 days course.  Severe SVC syndrome:Presented with swelling of face, severe edema of bilateral upper extremities. Undergoing palliative radiation therapy. Imaging showed marked stenosis/occlusion of the superior vena cava and marked narrowing of the right pulmonary artery  Hyponatremia:Sodium of 125 today. 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 Started on salt tablets and Lasix. Check BMP in a week.  Urinary retention: Consistently retained urine and has to be in and out cathed. Now with a  Foley catheter. Started  on tamsulosin.  Goals of care:Palliative care consulted.Remains full code. We feel that she is a candidate for hospice.Marland Kitchen PT/OT recommended skilled nursing facility.  We recommend follow-up with palliative care as an outpatient.    Principal Problem:   Pulmonary embolism (HCC) Active Problems:   SVC  syndrome   Pleural effusion   Adenocarcinoma of right lung, stage 4 (HCC)   Difficult intravenous access    Nutrition Problem: Increased nutrient needs Etiology: acute illness, cancer and cancer related treatments      DVT prophylaxis: Eliquis Code Status: Full code Family Communication: Talked to both sons on 10/09/2019  Disposition Plan: Patient is from home.  Plan is to discharge her to skilled nursing facility.  Waiting for bed.   Consultants: PCCM, radiation oncology, oncology  Procedures: Endobronchial biopsy, thoracentesis, Pleurx catheter placement  Antimicrobials:  Anti-infectives (From admission, onward)   Start     Dose/Rate Route Frequency Ordered Stop   10/10/19 0000  cephALEXin (KEFLEX) 500 MG capsule     500 mg Oral Every 12 hours 10/09/19 1330 10/15/19 2359   10/06/19 1100  cephALEXin (KEFLEX) capsule 500 mg     500 mg Oral Every 12 hours 10/06/19 1045 10/15/19 0959   10/01/19 1600  cefTRIAXone (ROCEPHIN) 2 g in sodium chloride 0.9 % 100 mL IVPB  Status:  Discontinued     2 g 200 mL/hr over 30 Minutes Intravenous Every 24 hours 10/01/19 1441 10/06/19 1045   10/01/19 1130  ceFAZolin (ANCEF) IVPB 2g/100 mL premix     2 g 200 mL/hr over 30 Minutes Intravenous  Once 10/01/19 1033 10/01/19 1258      Subjective:  Patient seen and examined at the bedside this morning.  Hemodynamically stable.  She feels better today.  On 3 L of oxygen per minute.  Respiratory status stable.  Hemodynamically stable for discharge  Objective: Vitals:   10/09/19 1040 10/09/19 1215 10/09/19 2230 10/10/19 0522  BP: (!) 143/80 (!) 119/56 (!) 129/56 116/68  Pulse: 98 99 99 91  Resp:  18 18 20   Temp:  98.2 F (36.8 C) 98.3 F (36.8 C) 97.6 F (36.4 C)  TempSrc:  Oral Oral Oral  SpO2:  96% 100% 100%  Weight:    90.5 kg  Height:        Intake/Output Summary (Last 24 hours) at 10/10/2019 1101 Last data filed at 10/10/2019 9242 Gross per 24 hour  Intake 110 ml  Output 1050 ml   Net -940 ml   Filed Weights   10/08/19 0500 10/09/19 0500 10/10/19 0522  Weight: 91.1 kg 91.1 kg 90.5 kg    Examination:  General exam: Not in distress, generalized weakness, chronically looking Respiratory system: Decreased air entry on the right side, no wheezes or crackles  Cardiovascular system: S1 & S2 heard, RRR Gastrointestinal system: Abdomen is nondistended, soft and nontender. Central nervous system: Alert and oriented. No focal neurological deficits. Extremities: Bilateral upper extremity edema, no clubbing ,no cyanosis. Skin: No rashes, lesions or ulcers,no icterus ,no pallor    Data Reviewed: I have personally reviewed following labs and imaging studies  CBC: Recent Labs  Lab 10/04/19 0105 10/06/19 0210 10/07/19 0121  WBC 8.6 8.6 9.9  HGB 8.4* 8.1* 8.8*  HCT 27.9* 26.8* 28.0*  MCV 85.1 85.4 83.6  PLT 217 251 683   Basic Metabolic Panel: Recent Labs  Lab 10/05/19 0146 10/08/19 0108 10/09/19 0316 10/09/19 1218 10/10/19 0805  NA 129* 126* 124* 125* 125*  K 4.5 4.6 4.8  --   --   CL 94* 89* 87*  --   --   CO2 28 30 28   --   --   GLUCOSE 105* 127* 109*  --   --   BUN 20 14 13   --   --   CREATININE 0.55 0.51 0.46  --   --   CALCIUM 8.6* 8.8* 8.4*  --   --    GFR: Estimated Creatinine Clearance: 67 mL/min (by C-G formula based on SCr of 0.46 mg/dL). Liver Function Tests: No results for input(s): AST, ALT, ALKPHOS, BILITOT, PROT, ALBUMIN in the last 168 hours.  No results for input(s): LIPASE, AMYLASE in the last 168 hours. No results for input(s): AMMONIA in the last 168 hours. Coagulation Profile: No results for input(s): INR, PROTIME in the last 168 hours. Cardiac Enzymes: No results for input(s): CKTOTAL, CKMB, CKMBINDEX, TROPONINI in the last 168 hours. BNP (last 3 results) No results for input(s): PROBNP in the last 8760 hours. HbA1C: No results for input(s): HGBA1C in the last 72 hours. CBG: No results for input(s): GLUCAP in the last 168  hours. Lipid Profile: No results for input(s): CHOL, HDL, LDLCALC, TRIG, CHOLHDL, LDLDIRECT in the last 72 hours. Thyroid Function Tests: No results for input(s): TSH, T4TOTAL, FREET4, T3FREE, THYROIDAB in the last 72 hours. Anemia Panel: No results for input(s): VITAMINB12, FOLATE, FERRITIN, TIBC, IRON, RETICCTPCT in the last 72 hours. Sepsis Labs: No results for input(s): PROCALCITON, LATICACIDVEN in the last 168 hours.  Recent Results (from the past 240 hour(s))  MRSA PCR Screening     Status: None   Collection Time: 09/30/19  5:17 PM   Specimen: Nasopharyngeal  Result Value Ref Range Status   MRSA by PCR NEGATIVE NEGATIVE Final    Comment:        The GeneXpert MRSA Assay (FDA approved for NASAL specimens only), is one component of a comprehensive MRSA colonization surveillance program. It is not intended to diagnose MRSA infection nor to guide or monitor treatment for MRSA infections. Performed at [facility], [place] [address] [place], [place] [address]   Body fluid culture     Status: None   Collection Time: 10/01/19  4:09 PM   Specimen: Pleural, Right; Body Fluid  Result Value Ref Range Status   Specimen Description   Final    Pleural R Performed at [facility] [address] [place], [place] [address]    Special Requests   Final    NONE Performed at [facility], [place] [address] [place], [place] [address]    Gram Stain   Final    MODERATE WBC PRESENT,BOTH PMN AND MONONUCLEAR NO ORGANISMS SEEN    Culture   Final    NO GROWTH 3 DAYS Performed at [facility], [place] [address], [place], [place] [address]    Report Status 10/05/2019 FINAL  Final  SARS CORONAVIRUS 2 (TAT 6-24 HRS) Nasopharyngeal Nasopharyngeal Swab     Status: None   Collection Time: 10/07/19  5:31 AM   Specimen: Nasopharyngeal Swab  Result Value Ref Range Status   SARS Coronavirus 2 NEGATIVE NEGATIVE Final    Comment: (NOTE)  SARS-CoV-2 target nucleic acids are NOT DETECTED. The SARS-CoV-2 RNA is generally detectable in upper and lower respiratory specimens during the acute phase of infection. Negative results do not preclude SARS-CoV-2 infection, do not rule out co-infections with other pathogens, and should not be used as the sole basis for treatment or other patient management decisions. Negative results must be combined with clinical observations, patient history, and epidemiological information. The expected result is Negative. Fact Sheet for Patients: SugarRoll.be Fact Sheet for Healthcare Providers: https://www.woods-mathews.com/ This test is not yet approved or cleared by the [place] FDA and  has been authorized for detection and/or diagnosis of SARS-CoV-2 by FDA under an Emergency Use Authorization (EUA). This EUA will remain  in effect (meaning this test can be used) for the duration of the COVID-19 declaration under Section 56 4(b)(1) of the Act, 21 U.S.C. section 360bbb-3(b)(1), unless the authorization is terminated or revoked sooner. Performed at  [facility], [place] [address] [place], [place] [address]          Radiology Studies: No results found.      Scheduled Meds: . apixaban  5 mg Oral BID  . cephALEXin  500 mg Oral Q12H  . Chlorhexidine Gluconate Cloth  6 each Topical Daily  . diclofenac Sodium  2 g Topical QID  . feeding supplement (ENSURE ENLIVE)  237 mL Oral BID BM  . feeding supplement (PRO-STAT SUGAR FREE 64)  30 mL Oral BID  . furosemide  20 mg Oral Daily  . guaiFENesin  600 mg Oral BID  . hydrocerin   Topical BID  . mouth rinse  15 mL Mouth Rinse BID  . multivitamin with minerals  1 tablet Oral Daily  . senna-docusate  1 tablet Oral BID  . sodium chloride  2 g Oral BID WC  . tamsulosin  0.4 mg Oral Daily   Continuous Infusions:    LOS: 17 days    Time spent: 25 mins.More than 50% of that time was spent  in counseling and/or coordination of care.      Shelly Coss, MD [facility] Pager [phone]  If 7PM-7AM, please contact night-coverage [email or web address] Password [email or web address] 10/10/2019, 11:01 AM

## 2019-10-10 NOTE — Progress Notes (Signed)
Patients son [name] to provide update regarding discharge plan of care. No questions, concerns at this time.

## 2019-10-10 NOTE — TOC Progression Note (Signed)
Transition of Care [facility]) - Progression Note    Patient Details  Name: Admire Bunnell MRN: [id number] Date of Birth: [birth year]  [facility]) CM/SW Contact  Ross Ludwig, [place] Phone Number: 10/10/2019, 12:34 PM  Clinical Narrative:     CSW spoke to patient's son [name], and he chose [facility] in [place].  CSW contacted insurance company to begin authorization, reference number [id number].  Once insurance authorization has been received, patient can go to SNF with palliative to follow.  Expected Discharge Plan: Skilled Nursing Facility Barriers to Discharge: Insurance Authorization  Expected Discharge Plan and Services Expected Discharge Plan: [place]   Discharge Planning Services: CM Consult Post Acute Care Choice: [place] Living arrangements for the past 2 months: Single Family Home Expected Discharge Date: 10/09/19               DME Arranged: N/A         HH Arranged: NA           Social Determinants of Health (SDOH) Interventions    Readmission Risk Interventions No flowsheet data found.

## 2019-10-10 NOTE — Progress Notes (Addendum)
Call placed to [phone] at [facility]. Report given to [facility]. Pleurx drain, Foley cathter, Medications and oxygen needs reviewed. No questions, concerns a this time.

## 2019-10-13 ENCOUNTER — Encounter: Payer: Self-pay | Admitting: *Deleted

## 2019-10-13 ENCOUNTER — Other Ambulatory Visit: Payer: Self-pay | Admitting: Radiation Oncology

## 2019-10-13 ENCOUNTER — Ambulatory Visit: Payer: Medicare Other

## 2019-10-13 DIAGNOSIS — C3491 Malignant neoplasm of unspecified part of right bronchus or lung: Secondary | ICD-10-CM

## 2019-10-13 DIAGNOSIS — C3490 Malignant neoplasm of unspecified part of unspecified bronchus or lung: Secondary | ICD-10-CM

## 2019-10-13 NOTE — Progress Notes (Deleted)
@Patient  ID: Emily Kim, female    DOB: 08/06/1941, 79 y.o.   MRN: [id number]  No chief complaint on file.   Referring provider: No ref. provider found  HPI:  79 year old female former smoker consulted with) on 09/29/2019 while inpatient for evaluation of intermittent hemoptysis, pleural effusion, lung mass and 50 pound weight loss over the last 3 months.  Inpatient bronchoscopy with tissue diagnosis revealed adenocarcinoma of the lung stage IV.  Patient diagnosed with metastatic adenocarcinoma.  Patient had a Pleurx catheter placed on 10/01/2019.  Patient was to receive palliative radiation.  She is also followed by Dr. Earlie Server.  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 Valeta Harms  10/13/2019  - Visit   79 year old female former smoker initially consulted with our practice well inpatient on 09/29/2019.  Patient had a recent 50 pound weight loss over the last 3 months as well as intermittent mopped assist every 2 weeks.  Initial evaluation did reveal a lung mass, pleural effusion.  Patient underwent bronchoscopy with endobronchial ultrasound on 09/24/2019. Patient diagnosed with metastatic adenocarcinoma. Patient status post Pleurx catheter placement on 10/01/2019.  Plan upon discharge was palliative radiation, ongoing palliative care discussions, patient is a DNR.  Patient to follow-up with oncology Dr. Earlie Server.  Continue to drain Pleurx 3 times per week   Questionaires / Pulmonary Flowsheets:   MMRC: No flowsheet data found.  Tests:   Surgical pathology 1/7-adenocarcinoma Cytology 1/7-non-small cell lung cancer CT scan of the chest significant for pulmonary emboli   09/23/2019-CTA chest-acute segmental pulmonary embolism in the left lower lobe, widely metastatic lung cancer, large ill-defined right hilar and suprahilar mass with bilateral pulmonary parenchymal metastasis and for genetic carcinomatosis, multiple osseous  metastasis, the dominant mass results in severe stenosis of the SVC with multiple mediastinal and right chest wall collateral veins prominent filling of the psycho system and IVC as well as enhancement of the anterior left hepatic lobe, correlate for signs and symptoms of SVC syndrome, similar resulting in severe stenosis of distal right pulmonary artery narrowing of the right upper middle and lower lobe bronchi with complete collapse of the right middle lobe, moderate loculated right pleural effusion, small left pleural effusion  09/26/2019-thoracentesis-right thoracentesis yielding 900 cc of pleural fluid 09/29/2019-ultrasound-guided right thoracentesis yielding 600 mg of pleural fluid Pleural fluid culture 1/11 revealed Klebsiella and E. Coli sensitive to rocephin Pleural fluid culture 1/13-no organisms on gm stain, neg cultures on final Pleural fluid cytology positive  09/29/2019-CT chest with contrast-right upper lobe right perihilar mass with interval complete obstruction of the right upper lobe bronchus and resulting collapse consolidation of the right upper lobe, persistent right middle lobe occlusion with associated atelectasis, multiple bilateral hematochezia is fully distributed pulmonary nodules and osseous metastasis, findings are most indicative of stage IV lung cancer, associated marked stenosis of the superior vena cava and marked narrowing of the right pulmonary artery, large right pleural effusion, slightly decreased from 09/23/2019 after thoracentesis today, small to moderate simple left pleural effusion, lesion of the hepatic dome, right adrenal thickening  FENO:  No results found for: NITRICOXIDE  PFT: No flowsheet data found.  WALK:  No flowsheet data found.  Imaging: DG Chest 2 View  Result Date: 10/05/2019 CLINICAL DATA:  Pleural effusion EXAM: CHEST - 2 VIEW COMPARISON:  Radiograph 10/05/2019 FINDINGS: Redemonstration of an inferiorly directed right chest tube. Tip terminates  in the mid to lower right thorax medially. Redemonstration of the partially loculated moderate right  pleural effusion with fluid tracking along the fissures. Diffusely increased opacity in the right hemithorax is compatible with passive areas of atelectasis and fluid layering posteriorly. Nodular opacities are again seen in the left lung. Small left effusion is present as well. Visible portions of the cardiac silhouette are unchanged from prior with a calcified aorta. IMPRESSION: Stable appearance of the chest with moderate right pleural effusion and small left pleural effusion. Stable nodular opacities in the left lung. Electronically Signed   By: Lovena Le M.D.   On: 10/05/2019 15:54   DG Chest 2 View  Result Date: 09/24/2019 CLINICAL DATA:  Preop evaluation EXAM: CHEST - 2 VIEW COMPARISON:  09/23/2019 FINDINGS: Cardiac shadows within normal limits. Right-sided pleural effusion is noted. Scattered pulmonary nodules are identified consistent with that seen on recent CT examination. Right paratracheal mass is noted consistent with known mass lesion. No acute bony abnormality is seen. IMPRESSION: Stable right-sided pleural effusion as well as pulmonary nodules. Persistent right paratracheal mass is noted as well. Electronically Signed   By: Inez Catalina M.D.   On: 09/24/2019 23:30   CT Head Wo Contrast  Result Date: 09/23/2019 CLINICAL DATA:  Status post trauma. EXAM: CT HEAD WITHOUT CONTRAST TECHNIQUE: Contiguous axial images were obtained from the base of the skull through the vertex without intravenous contrast. COMPARISON:  None. FINDINGS: Brain: There is mild cerebral atrophy with widening of the extra-axial spaces and ventricular dilatation. There are areas of decreased attenuation within the white matter tracts of the supratentorial brain, consistent with microvascular disease changes. Vascular: No hyperdense vessel or unexpected calcification. Skull: Normal. Negative for fracture or focal lesion.  Sinuses/Orbits: There is moderate severity right maxillary sinus mucosal thickening. Other: None. IMPRESSION: 1. No acute intracranial abnormality. 2. Mild cerebral atrophy and microvascular disease changes of the supratentorial brain. 3. Moderate severity right maxillary sinus mucosal thickening. The presence of a large polyp versus mucous retention cyst cannot be excluded. Electronically Signed   By: Virgina Norfolk M.D.   On: 09/23/2019 20:38   CT CHEST W CONTRAST  Result Date: 09/29/2019 CLINICAL DATA:  Pneumonia, effusion or abscess suspected. EXAM: CT CHEST WITH CONTRAST TECHNIQUE: Multidetector CT imaging of the chest was performed during intravenous contrast administration. CONTRAST:  48mL OMNIPAQUE IOHEXOL 300 MG/ML  SOLN COMPARISON:  09/23/2019. FINDINGS: Cardiovascular: Extensive collateral venous flow within the mediastinum and left chest. Occlusion of the SVC. Right pulmonary artery is encased and markedly narrowed. Atherosclerotic calcification of the aorta and aortic valve. Heart size normal. No pericardial effusion. Mediastinum/Nodes: Image quality is degraded by extensive streak artifact patient's arms and collateral venous flow. Ill-defined soft tissue is seen in the subcarinal region, extending along the right hilum with narrowing and encasement of the right middle and right lower lobe bronchi. Lungs/Pleura: Right upper lobe bronchus is now completely obstructed with collapse/consolidation in the right upper lobe, obscuring the previously seen medial right upper lobe mass. Right middle lobe obstruction is again seen with collapse of the right middle lobe. Large right pleural effusion, slightly decreased from 09/23/2019 status post thoracentesis earlier today. Associated collapse/consolidation in the right lower lobe. Small to moderate simple appearing left pleural effusion with compressive atelectasis in the left lower lobe. Multiple hematogenously distributed shaggy bilateral pulmonary  nodules, as on 09/23/2019. Upper Abdomen: Altered perfusion within the left hepatic lobe is related to SVC occlusion and extensive collateral venous flow. 1.4 cm low-attenuation lesion in the dome of the liver is difficult to characterize due to size. Visualized portions of  the liver and gallbladder are otherwise unremarkable. Thickening of the right adrenal gland. Left adrenal gland is grossly unremarkable. Low-attenuation lesions in the kidneys measure up to 2.4 cm on the right and are likely cysts. 6 mm hyperattenuating lesion in the upper pole right kidney is too small to characterize. Visualized portions of the spleen and stomach are grossly unremarkable. Musculoskeletal: Degenerative changes in the spine. There are patchy areas of sclerosis throughout spine, as on the prior exam. IMPRESSION: 1. Right upper lobe/right perihilar mass with interval complete obstruction of the right upper lobe bronchus and resultant collapse/consolidation of the right upper lobe. Persistent right middle lobe occlusion with associated atelectasis. Multiple bilateral hematogenously distributed pulmonary nodules and osseous metastases. Findings are most indicative of stage IV lung cancer. 2. Associated marked stenosis/occlusion of the superior vena cava and marked narrowing of the right pulmonary artery. 3. Large right pleural effusion, slightly decreased from 09/23/2019 after thoracentesis earlier today. No pneumothorax. Associated collapse/consolidation in the right lower lobe. 4. Small to moderate simple left pleural effusion. 5. Lesion in the hepatic dome, better seen on CT abdomen 09/26/2019. 6. Right adrenal thickening, described as a nodule on 09/26/2019. Please refer to that study for further details and discussion. Electronically Signed   By: Lorin Picket M.D.   On: 09/29/2019 16:15   CT Angio Chest PE W/Cm &/Or Wo Cm  Result Date: 09/23/2019 CLINICAL DATA:  Shortness of breath and cough for the past week. Blood tinged  sputum. EXAM: CT ANGIOGRAPHY CHEST WITH CONTRAST TECHNIQUE: Multidetector CT imaging of the chest was performed using the standard protocol during bolus administration of intravenous contrast. Multiplanar CT image reconstructions and MIPs were obtained to evaluate the vascular anatomy. CONTRAST:  73mL OMNIPAQUE IOHEXOL 350 MG/ML SOLN COMPARISON:  Chest x-ray from same day. FINDINGS: Cardiovascular: Satisfactory opacification of the pulmonary arteries to the segmental level. Acute segmental pulmonary emboli in the left lower lobe. Severe stenosis of the distal right pulmonary artery. Normal heart size. No pericardial effusion. Severe stenosis of the SVC with multiple mediastinal and right chest wall collateral veins, and prominent enhancement of the azygos system. No thoracic aortic aneurysm. Mediastinum/Nodes: No enlarged mediastinal, hilar, or axillary lymph nodes. Thyroid gland, trachea, and esophagus demonstrate no significant findings. Lungs/Pleura: Large ill-defined right hilar and suprahilar mass with narrowing of the right upper, middle, and lower lobe bronchi. Complete collapse of the right middle lobe. Perilymphatic nodularity and innumerable small pulmonary nodules in both lungs. Moderate loculated right pleural effusion. Small left pleural effusion. Upper Abdomen: Enhancement of the anterior left hepatic lobe. Punctate nonobstructive left renal calculus. Musculoskeletal: Sclerosis of the lateral right ninth rib and left aspect of the sternal body. Sclerotic lesions involving the T3, T6, T7, T9, T10, and T12 vertebral bodies. Review of the MIP images confirms the above findings. IMPRESSION: 1. Acute segmental pulmonary emboli in the left lower lobe. 2. Widely metastatic lung cancer. Large ill-defined right hilar and suprahilar mass with bilateral pulmonary parenchymal metastases and lymphangitic carcinomatosis. Multiple osseous metastases. 3. The dominant mass results in severe stenosis of the SVC with  multiple mediastinal and right chest wall collateral veins, prominent filling of the azygos system and IVC, as well as enhancement of the anterior left hepatic lobe. Correlate for signs and symptoms of SVC syndrome. 4. Similar resultant severe stenosis of the distal right pulmonary artery and narrowing of the right upper, middle, and lower lobe bronchi with complete collapse of the right middle lobe. 5. Moderate loculated right pleural effusion. Small left  pleural effusion. Critical Value/emergent results were called by telephone at the time of interpretation on 09/23/2019 at 8:59 pm to provider Claiborne Billings PA, who verbally acknowledged these results. Electronically Signed   By: Titus Dubin M.D.   On: 09/23/2019 21:08   MR BRAIN W WO CONTRAST  Result Date: 09/26/2019 CLINICAL DATA:  79 year old female with recently diagnosed non-small cell lung cancer. Staging. EXAM: MRI HEAD WITHOUT AND WITH CONTRAST TECHNIQUE: Multiplanar, multiecho pulse sequences of the brain and surrounding structures were obtained without and with intravenous contrast. CONTRAST:  28mL GADAVIST GADOBUTROL 1 MMOL/ML IV SOLN COMPARISON:  Chest CTA 09/23/2019. Head CT 09/23/2019. FINDINGS: Brain: No abnormal enhancement identified. Some postcontrast images are degraded by motion. No midline shift, mass effect, or evidence of intracranial mass lesion. No dural thickening. No restricted diffusion to suggest acute infarction. No ventriculomegaly, extra-axial collection or acute intracranial hemorrhage. Cervicomedullary junction and pituitary are within normal limits. Mild to moderate for age scattered and patchy bilateral cerebral white matter T2 and FLAIR hyperintensity. No cortical encephalomalacia or chronic cerebral blood products. The deep gray matter nuclei, brainstem and cerebellum appear normal for age. Vascular: Major intracranial vascular flow voids are preserved. Major dural venous sinuses are enhancing and appear to be patent. Skull and  upper cervical spine: Negative visible cervical spine and spinal cord. Bone marrow signal in the skull is mildly heterogeneous, and there is heterogeneous enhancement in the clivus (series 13, image 11), although no overtly destructive osseous lesion. Sinuses/Orbits: Negative orbits. Right maxillary sinus mucous retention cyst. Other: Visible internal auditory structures appear normal. Mastoids are clear. Scalp and face soft tissues appear negative. IMPRESSION: 1. No acute intracranial abnormality or metastatic disease to the brain. 2. Heterogeneous skull marrow signal, including in the clivus. Osseous metastatic disease not excluded. 3. Mild to moderate for age cerebral white matter signal changes, most commonly due to chronic small vessel disease. Electronically Signed   By: Genevie Ann M.D.   On: 09/26/2019 11:56   CT ABDOMEN PELVIS W CONTRAST  Result Date: 09/26/2019 CLINICAL DATA:  Inpatient. New diagnosis of metastatic right upper lobe lung cancer. Staging evaluation. Additional history of hysterectomy for uterine cancer. EXAM: CT ABDOMEN AND PELVIS WITH CONTRAST TECHNIQUE: Multidetector CT imaging of the abdomen and pelvis was performed using the standard protocol following bolus administration of intravenous contrast. CONTRAST:  151mL OMNIPAQUE IOHEXOL 300 MG/ML  SOLN COMPARISON:  09/23/2019 chest CT angiogram. FINDINGS: Lower chest: Redemonstration of numerous solid pulmonary nodules scattered throughout both lung bases measuring up to 9 mm in the left lower lobe (series 4/image 26) and 11 mm in the medial right lower lobe (series 4/image 11), not appreciably changed since 09/23/2019 chest CT. Small dependent bilateral pleural effusions, left greater than right, decreased on the right and increased on the left since 09/23/2019 chest CT. Hepatobiliary: Three similar hypodense scattered liver masses measuring 2.4 cm in segment 2 (series 2/image 21), 1.7 cm in segment 3 (series 2/image 30) and 1.6 cm in segment  8 (series 2/image 15). Normal gallbladder with no radiopaque cholelithiasis. No biliary ductal dilatation. Pancreas: Normal, with no mass or duct dilation. Spleen: Normal size. No mass. Adrenals/Urinary Tract: Hypodense 1.5 cm right adrenal nodule (series 2/image 26) with density 34 HU. No discrete left adrenal nodule. Simple 2.6 cm interpolar right renal cyst. Subcentimeter hypodense upper left renal cortical lesion, too small to characterize. No hydronephrosis. Normal bladder. Stomach/Bowel: Normal non-distended stomach. Normal caliber small bowel with no small bowel wall thickening. Normal appendix. Normal large bowel  with no diverticulosis, large bowel wall thickening or pericolonic fat stranding. Vascular/Lymphatic: Atherosclerotic nonaneurysmal abdominal aorta. Patent portal, splenic, hepatic and renal veins. No pathologically enlarged lymph nodes in the abdomen or pelvis. Reproductive: Status post hysterectomy, with no abnormal findings at the vaginal cuff. No adnexal mass. Other: No pneumoperitoneum, ascites or focal fluid collection. Mild to moderate anasarca. Musculoskeletal: Widespread sclerotic osseous lesions throughout the lumbar spine (most prominent in the L3 vertebral body), left sacrum, bilateral iliac bones, posterior right acetabulum, right pubic bone and left femoral neck. Moderate lumbar spondylosis. IMPRESSION: 1. Three similar scattered indeterminate hypodense liver masses, largest 2.4 cm in segment 2, suspicious for liver metastases. 2. Indeterminate 1.5 cm right adrenal nodule, suspicious for right adrenal metastasis. 3. Widespread sclerotic osseous metastases throughout lumbar spine, sacrum and bilateral pelvic girdle, including in the left femoral neck predisposing to pathologic fracture. 4. Redemonstration of widespread pulmonary metastases at both lung bases. Small dependent left pleural effusion has increased. Small dependent right pleural effusion has decreased. 5.  Aortic  Atherosclerosis (ICD10-I70.0). Electronically Signed   By: Ilona Sorrel M.D.   On: 09/26/2019 19:19   DG Chest Port 1 View  Result Date: 10/05/2019 CLINICAL DATA:  79 year old female with history of uterine cancer status post hysterectomy presenting with shortness of breath and hemoptysis for the past 2 weeks with some weight loss. EXAM: PORTABLE CHEST 1 VIEW COMPARISON:  Chest x-ray 10/02/2019. FINDINGS: Right-sided chest tube with tip in the medial aspect of the mid right hemithorax. Large right pleural effusion which remains partially loculated, with the largest component in the apex of the right hemithorax. Multiple pulmonary nodules noted throughout the left lung. Small left pleural effusion. No evidence of pulmonary edema. Heart size is upper limits of normal. Masslike area in the right hilar region. Aortic atherosclerosis. IMPRESSION: 1. Support apparatus, as above. 2. Known right upper lobe perihilar mass redemonstrated with persistent large partially loculated right pleural effusion, stable compared to prior examinations. 3. Small left pleural effusion. 4. Multiple pulmonary nodules in the left lung, concerning for metastatic disease. Electronically Signed   By: Vinnie Langton M.D.   On: 10/05/2019 14:29   DG CHEST PORT 1 VIEW  Result Date: 10/02/2019 CLINICAL DATA:  Pleural effusion.  Chest tube. EXAM: PORTABLE CHEST 1 VIEW COMPARISON:  10/01/2019.  CT 09/29/2019 and 09/23/2019. FINDINGS: Right chest tube again noted. Continued improvement of right pleural effusion. Persistent right upper lung mass atelectatic changes. Persistent pulmonary nodules. Reference is made to CT report. Tiny left pleural effusion. No pneumothorax. IMPRESSION: 1. Right chest tube in stable position. Continued improvement of right pleural effusion. No pneumothorax. 2. Persistent right upper lung mass and atelectatic changes. Pulmonary nodules again noted. Small left pleural effusion. Electronically Signed   By: Marcello Moores   Register   On: 10/02/2019 08:35   DG Chest Port 1 View  Result Date: 10/01/2019 CLINICAL DATA:  Status post right chest tube placement. EXAM: PORTABLE CHEST 1 VIEW COMPARISON:  Single-view of the chest 09/30/2019. FINDINGS: New small bore right chest tube is in place. Right pleural effusion is decreased there is improved in aeration in the right chest. No pneumothorax. Very small left pleural effusion noted. Heart size is normal. Atherosclerosis is seen. IMPRESSION: Decreased right pleural effusion and improved aeration in the right chest after chest tube placement. Negative for pneumothorax. No new abnormality. Electronically Signed   By: Inge Rise M.D.   On: 10/01/2019 14:15   DG Chest Port 1 View  Result Date: 09/30/2019 CLINICAL DATA:  History of uterine cancer. EXAM: PORTABLE CHEST 1 VIEW COMPARISON:  CT of 1 day prior.  Chest radiograph of 1 day prior. FINDINGS: Patient rotated right. Normal heart size. Atherosclerosis in the transverse aorta. No pneumothorax. Near complete whiteout of the right hemithorax, significantly worsened aeration since the radiograph 1 day prior. Left-sided pulmonary nodules. IMPRESSION: Significantly worsened right-sided aeration, with near complete whiteout. Likely a combination of increasing pleural fluid and collapse, superimposed upon underlying mass as detailed on CT. Left-sided pulmonary nodules/metastasis, as before. Aortic Atherosclerosis (ICD10-I70.0). These results will be called to the ordering clinician or representative by the Radiologist Assistant, and communication documented in the PACS or zVision Dashboard. Electronically Signed   By: Abigail Miyamoto M.D.   On: 09/30/2019 13:55   DG Chest Port 1 View  Result Date: 09/29/2019 CLINICAL DATA:  Right pleural effusion. Status post thoracentesis. Metastatic lung cancer. Pulmonary emboli. EXAM: PORTABLE CHEST 1 VIEW 1:01 p.m. COMPARISON:  09/29/2019 at 11:01 a.m. and CT scan of the chest dated 09/23/2019  FINDINGS: There has been a significant decrease in the large right pleural effusion with re-aeration of portions of the right lung after thoracentesis. No pneumothorax. Moderate residual right pleural effusion. Multiple small metastatic nodules are noted throughout the left lung. Much more apparent on the CT scan of 09/23/2019. Heart size and pulmonary vascularity are normal. Aortic atherosclerosis. IMPRESSION: 1. No pneumothorax after thoracentesis. 2. Decrease in the large right pleural effusion. 3. Residual moderate right pleural effusion. 4. Multiple metastatic nodules throughout the left lung. 5.  Aortic Atherosclerosis (ICD10-I70.0). Electronically Signed   By: Lorriane Shire M.D.   On: 09/29/2019 13:16   DG CHEST PORT 1 VIEW  Result Date: 09/29/2019 CLINICAL DATA:  Shortness of breath, uterine cancer, adenocarcinoma RIGHT lung stage IV EXAM: PORTABLE CHEST 1 VIEW COMPARISON:  Portable exam 1101 hours compared to 09/26/2019 FINDINGS: Normal heart size and mediastinal contours. Atherosclerotic calcification aorta. Complete opacification of the RIGHT hemithorax new since the previous exam likely representing a combination of pleural effusion and atelectasis. Possibility of mucous plugging raised due to the rapid interval change since 09/26/2019. Mild atelectasis and tiny effusion at LEFT base. Questionable nodular density 8 mm diameter lower LEFT lung. No pneumothorax or acute osseous findings. IMPRESSION: Complete opacification of RIGHT hemithorax new since 09/26/2019 likely a combination of pleural effusion and atelectasis question mucous plugging. Atelectasis and tiny effusion at LEFT lung base with questionable 8 mm lung nodule lower LEFT lung. Electronically Signed   By: Lavonia Dana M.D.   On: 09/29/2019 11:20   DG Chest Port 1 View  Result Date: 09/26/2019 CLINICAL DATA:  Status post right thoracentesis EXAM: PORTABLE CHEST 1 VIEW COMPARISON:  09/25/2019 FINDINGS: Cardiac shadow is stable. Aortic  calcifications are again seen. Soft tissue density is noted in the right paratracheal region consistent with the patient's known history of lung carcinoma. Scattered nodular changes are noted within the left lung. Interval decrease in right-sided pleural effusion is noted. No pneumothorax is seen. IMPRESSION: No pneumothorax is noted. Changes consistent with the known history of lung carcinoma. Electronically Signed   By: Inez Catalina M.D.   On: 09/26/2019 17:03   DG Chest Port 1 View  Result Date: 09/25/2019 CLINICAL DATA:  Status post bronchoscopy and biopsies today. EXAM: PORTABLE CHEST 1 VIEW COMPARISON:  PA and lateral chest earlier today. CT chest 09/23/2019. FINDINGS: There is no pneumothorax after bronchoscopy. Right much greater than left pleural effusions again seen. Right suprahilar mass and multiple bilateral pulmonary nodules  are again seen. Heart size is normal. Atherosclerosis noted. IMPRESSION: Negative for pneumothorax after bronchoscopy. No change in right suprahilar mass, right greater than left pleural effusions and multiple pulmonary nodules. Electronically Signed   By: Inge Rise M.D.   On: 09/25/2019 10:09   DG Chest Portable 1 View  Result Date: 09/23/2019 CLINICAL DATA:  Shortness of breath and cough for 1 week, productive blood-tinged sputum EXAM: PORTABLE CHEST 1 VIEW COMPARISON:  None FINDINGS: There is a complex right pleural effusion tracking within the fissures and along the lung apex, worrisome for loculation. Adjacent opacity likely reflect some passive atelectasis though an underlying consolidative processes not excluded. No pneumothorax. Left lung is essentially clear. The aorta is calcified. Right heart border is obscured by adjacent opacity. Remaining cardiomediastinal contours are unremarkable. Degenerative changes are present in the imaged spine and shoulders. No acute osseous or soft tissue abnormality. IMPRESSION: 1. Findings worrisome for complex right pleural  effusion. Underlying consolidative processes not excluded. Could consider CT chest with contrast for further characterization. Electronically Signed   By: Lovena Le M.D.   On: 09/23/2019 05:36   ECHOCARDIOGRAM COMPLETE  Result Date: 09/25/2019   ECHOCARDIOGRAM REPORT   Patient Name:   DOREAN HIEBERT Date of Exam: 09/25/2019 Medical Rec #:  [id number]     Height:       67.0 in Accession #:    [id number]    Weight:       185.4 lb Date of Birth:  Nov 17, 1940     BSA:          1.96 m Patient Age:    53 years      BP:           142/82 mmHg Patient Gender: F             HR:           90 bpm. Exam Location:  Inpatient Procedure: 2D Echo, Color Doppler and Cardiac Doppler Indications:    I26.02 Pulmonary embolus  History:        Patient has no prior history of Echocardiogram examinations. SVC                 Syndrome.  Sonographer:    Raquel Sarna Senior RDCS Referring Phys: [place] Comments: Technically difficult study due to poor echo windows. IMPRESSIONS  1. Left ventricular ejection fraction, by visual estimation, is 55 to 60%. The left ventricle has normal function. There is no left ventricular hypertrophy.  2. Left ventricular diastolic parameters are indeterminate.  3. The left ventricle has no regional wall motion abnormalities.  4. Global right ventricle has normal systolic function.The right ventricular size is normal. No increase in right ventricular wall thickness.  5. Left atrial size was normal.  6. Right atrial size was normal.  7. Moderate pleural effusion in both left and right lateral regions.  8. Presence of pericardial fat pad.  9. Trivial pericardial effusion is present. 10. Mild mitral annular calcification. 11. The mitral valve is normal in structure. Trivial mitral valve regurgitation. 12. The tricuspid valve is normal in structure. 13. The aortic valve is tricuspid. Aortic valve regurgitation is not visualized. Mild aortic valve sclerosis without stenosis. 14. The pulmonic  valve was grossly normal. Pulmonic valve regurgitation is trivial. 15. Moderately elevated pulmonary artery systolic pressure. 16. The inferior vena cava is normal in size with greater than 50% respiratory variability, suggesting right atrial pressure of 3 mmHg. 17. Technically difficult study due  to limited echo windows. No gross abnormalities seen, but reduced sensitivity due to technical limitations. FINDINGS  Left Ventricle: Left ventricular ejection fraction, by visual estimation, is 55 to 60%. The left ventricle has normal function. The left ventricle has no regional wall motion abnormalities. There is no left ventricular hypertrophy. Left ventricular diastolic parameters are indeterminate. Right Ventricle: The right ventricular size is normal. No increase in right ventricular wall thickness. Global RV systolic function is has normal systolic function. The tricuspid regurgitant velocity is 3.19 m/s, and with an assumed right atrial pressure  of 3 mmHg, the estimated right ventricular systolic pressure is moderately elevated at 43.7 mmHg. Left Atrium: Left atrial size was normal in size. Right Atrium: Right atrial size was normal in size Pericardium: Trivial pericardial effusion is present. Presence of pericardial fat pad. There is a moderate pleural effusion in both left and right lateral regions. Mitral Valve: The mitral valve is normal in structure. There is mild thickening of the mitral valve leaflet(s). There is mild calcification of the mitral valve leaflet(s). Mild mitral annular calcification. Trivial mitral valve regurgitation. Tricuspid Valve: The tricuspid valve is normal in structure. Tricuspid valve regurgitation is mild. Aortic Valve: The aortic valve is tricuspid. . There is mild thickening and mild calcification of the aortic valve. Aortic valve regurgitation is not visualized. Mild aortic valve sclerosis is present, with no evidence of aortic valve stenosis. There is mild thickening of the  aortic valve. There is mild calcification of the aortic valve. Pulmonic Valve: The pulmonic valve was grossly normal. Pulmonic valve regurgitation is trivial. Pulmonic regurgitation is trivial. Aorta: The aortic root, ascending aorta and aortic arch are all structurally normal, with no evidence of dilitation or obstruction. Pulmonary Artery: The pulmonary artery is not well seen. Venous: The inferior vena cava is normal in size with greater than 50% respiratory variability, suggesting right atrial pressure of 3 mmHg. IAS/Shunts: No atrial level shunt detected by color flow Doppler.  LEFT VENTRICLE PLAX 2D LVIDd:         3.90 cm  Diastology LVIDs:         2.80 cm  LV e' lateral:   5.66 cm/s LV PW:         0.90 cm  LV E/e' lateral: 9.2 LV IVS:        1.10 cm  LV e' medial:    4.35 cm/s LVOT diam:     1.90 cm  LV E/e' medial:  12.0 LV SV:         36 ml LV SV Index:   18.04 LVOT Area:     2.84 cm  RIGHT VENTRICLE RV S prime:     18.10 cm/s TAPSE (M-mode): 1.6 cm LEFT ATRIUM             Index       RIGHT ATRIUM           Index LA diam:        2.60 cm 1.33 cm/m  RA Area:     11.30 cm LA Vol (A2C):   23.8 ml 12.15 ml/m RA Volume:   24.30 ml  12.41 ml/m LA Vol (A4C):   34.9 ml 17.82 ml/m LA Biplane Vol: 29.6 ml 15.12 ml/m  AORTIC VALVE LVOT Vmax:   86.80 cm/s LVOT Vmean:  65.000 cm/s LVOT VTI:    0.167 m  AORTA Ao Root diam: 2.50 cm MITRAL VALVE  TRICUSPID VALVE MV Area (PHT): 2.87 cm             TR Peak grad:   40.7 mmHg MV PHT:        76.56 msec           TR Vmax:        319.00 cm/s MV Decel Time: 264 msec MV E velocity: 52.30 cm/s 103 cm/s  SHUNTS MV A velocity: 59.60 cm/s 70.3 cm/s Systemic VTI:  0.17 m MV E/A ratio:  0.88       1.5       Systemic Diam: 1.90 cm  Buford Dresser MD Electronically signed by Buford Dresser MD Signature Date/Time: 09/25/2019/7:01:47 PM    Final    US THORACENTESIS ASP PLEURAL SPACE W/IMG GUIDE  Result Date: 09/29/2019 INDICATION: Patient with  history of metastatic lung cancer, recurrent right pleural effusion status post thoracentesis 09/26/2019 yielding 900 mL pleural fluid. New complete opacification right hemithorax seen on chest x-ray today. Request to IR for repeat diagnostic and therapeutic right-sided thoracentesis. EXAM: ULTRASOUND GUIDED RIGHT THORACENTESIS MEDICATIONS: 10 mL 1% lidocaine COMPLICATIONS: None immediate. PROCEDURE: An ultrasound guided thoracentesis was thoroughly discussed with the patient and questions answered. The benefits, risks, alternatives and complications were also discussed. The patient understands and wishes to proceed with the procedure. Written consent was obtained. Ultrasound was performed to localize and mark an adequate pocket of fluid in the right chest. The area was then prepped and draped in the normal sterile fashion. 1% Lidocaine was used for local anesthesia. Under ultrasound guidance a 6 Fr Safe-T-Centesis catheter was introduced. Thoracentesis was performed. The catheter was removed and a dressing applied. FINDINGS: A total of approximately 600 mL of clear, light amber fluid was removed. Samples were sent to the laboratory as requested by the clinical team. IMPRESSION: Successful ultrasound guided right thoracentesis yielding 600 mL of pleural fluid. Read by Candiss Norse, PA-C Electronically Signed   By: Jacqulynn Cadet M.D.   On: 09/29/2019 13:20   US THORACENTESIS ASP PLEURAL SPACE W/IMG GUIDE  Result Date: 09/26/2019 INDICATION: Patient with history of metastatic lung cancer, right pleural effusion. Request made for diagnostic and therapeutic right thoracentesis. EXAM: ULTRASOUND GUIDED DIAGNOSTIC AND THERAPEUTIC RIGHT THORACENTESIS MEDICATIONS: None COMPLICATIONS: None immediate. PROCEDURE: An ultrasound guided thoracentesis was thoroughly discussed with the patient and questions answered. The benefits, risks, alternatives and complications were also discussed. The patient understands and  wishes to proceed with the procedure. Written consent was obtained. Ultrasound was performed to localize and mark an adequate pocket of fluid in the right chest. The area was then prepped and draped in the normal sterile fashion. 1% Lidocaine was used for local anesthesia. Under ultrasound guidance a 6 Fr Safe-T-Centesis catheter was introduced. Thoracentesis was performed. The catheter was removed and a dressing applied. FINDINGS: A total of approximately 900 cc of slightly hazy, amber fluid was removed. Samples were sent to the laboratory as requested by the clinical team. IMPRESSION: Successful ultrasound guided diagnostic and therapeutic right thoracentesis yielding 900 cc of pleural fluid. Read by: Rowe Robert, PA-C Electronically Signed   By: Aletta Edouard M.D.   On: 09/26/2019 16:38    Lab Results:  CBC    Component Value Date/Time   WBC 9.9 10/07/2019 0121   RBC 3.35 (L) 10/07/2019 0121   HGB 8.8 (L) 10/07/2019 0121   HCT 28.0 (L) 10/07/2019 0121   PLT 224 10/07/2019 0121   MCV 83.6 10/07/2019 0121   MCH 26.3 10/07/2019 0121  MCHC 31.4 10/07/2019 0121   RDW 17.0 (H) 10/07/2019 0121    BMET    Component Value Date/Time   NA 125 (L) 10/10/2019 0805   K 4.8 10/09/2019 0316   CL 87 (L) 10/09/2019 0316   CO2 28 10/09/2019 0316   GLUCOSE 109 (H) 10/09/2019 0316   BUN 13 10/09/2019 0316   CREATININE 0.46 10/09/2019 0316   CALCIUM 8.4 (L) 10/09/2019 0316   GFRNONAA >60 10/09/2019 0316   GFRAA >60 10/09/2019 0316    BNP No results found for: BNP  ProBNP No results found for: PROBNP  Specialty Problems      Pulmonary Problems   Pleural effusion   Adenocarcinoma of right lung, stage 4 (HCC)      No Known Allergies   There is no immunization history on file for this patient.  Past Medical History:  Diagnosis Date  . Uterine cancer ([place])     Tobacco History: Social History   Tobacco Use  Smoking Status Former Smoker  . Quit date: 09/28/1989  . Years since  quitting: 30.0  Smokeless Tobacco Never Used   Counseling given: Not Answered   Continue to not smoke  Outpatient Encounter Medications as of 10/14/2019  Medication Sig  . apixaban (ELIQUIS) 5 MG TABS tablet Take 1 tablet (5 mg total) by mouth 2 (two) times daily.  . cephALEXin (KEFLEX) 500 MG capsule Take 1 capsule (500 mg total) by mouth every 12 (twelve) hours for 5 days.  . feeding supplement, ENSURE ENLIVE, (ENSURE ENLIVE) LIQD Take 237 mLs by mouth 2 (two) times daily between meals.  . furosemide (LASIX) 20 MG tablet Take 1 tablet (20 mg total) by mouth daily.  Marland Kitchen guaiFENesin-dextromethorphan (ROBITUSSIN DM) 100-10 MG/5ML syrup Take 10 mLs by mouth every 4 (four) hours as needed for cough.  Marland Kitchen ipratropium-albuterol (DUONEB) 0.5-2.5 (3) MG/3ML SOLN Take 3 mLs by nebulization every 4 (four) hours as needed.  . Multiple Vitamin (MULTIVITAMIN WITH MINERALS) TABS tablet Take 1 tablet by mouth daily.  Marland Kitchen senna-docusate (SENOKOT-S) 8.6-50 MG tablet Take 1 tablet by mouth 2 (two) times daily.  . sodium chloride 1 g tablet Take 2 tablets (2 g total) by mouth 2 (two) times daily with a meal.  . tamsulosin (FLOMAX) 0.4 MG CAPS capsule Take 1 capsule (0.4 mg total) by mouth daily.   No facility-administered encounter medications on file as of 10/14/2019.     Review of Systems  Review of Systems   Physical Exam  There were no vitals taken for this visit.  Wt Readings from Last 5 Encounters:  10/10/19 199 lb 8 oz (90.5 kg)    BMI Readings from Last 5 Encounters:  10/10/19 31.25 kg/m     Physical Exam    Assessment & Plan:   No problem-specific Assessment & Plan notes found for this encounter.    No follow-ups on file.   Lauraine Rinne, NP 10/13/2019   This appointment required *** minutes of patient care (this includes precharting, chart review, review of results, face-to-face care, etc.).

## 2019-10-13 NOTE — Progress Notes (Signed)
Oncology Nurse Navigator Documentation  Oncology Nurse Navigator Flowsheets 10/13/2019  Navigator Location [facility]  Navigator Encounter Type Telephone;Other/I called Emily Kim [phone] to schedule patient with Dr. Julien Nordmann.  I called several times and was not able to reach anyone.  I finally spoke with [name] the nurse director and she was able to help me schedule Emily Kim.  She will be seen on 10/16/19.  She verbalized understanding of appt time and place.   Telephone Outgoing Call  Treatment Phase Pre-Tx/Tx Discussion  Barriers/Navigation Needs Coordination of Care  Interventions Coordination of Care  Acuity Level 2-Minimal Needs (1-2 Barriers Identified)  Coordination of Care -  Time Spent with Patient 60

## 2019-10-13 NOTE — Progress Notes (Signed)
Oncology Nurse Navigator Documentation  Oncology Nurse Navigator Flowsheets 10/13/2019  Navigator Location [facility]  Navigator Encounter Type Telephone/I followed up on Emily Kim to see if she was out of the hospital.  She is out and at [facility].  I called to schedule patient but was unable to reach their transportation.  I did leave vm message for them to call me with my name and phone number.   Telephone Outgoing Call  Treatment Phase Pre-Tx/Tx Discussion  Barriers/Navigation Needs Coordination of Care  Interventions Coordination of Care  Acuity Level 2-Minimal Needs (1-2 Barriers Identified)  Coordination of Care Other  Time Spent with Patient 30

## 2019-10-14 ENCOUNTER — Inpatient Hospital Stay: Payer: Medicare Other | Admitting: Pulmonary Disease

## 2019-10-14 ENCOUNTER — Ambulatory Visit
Admission: RE | Admit: 2019-10-14 | Discharge: 2019-10-14 | Disposition: A | Discharge status: Home / Self Care | Payer: Medicare Other | Source: Ambulatory Visit | Attending: Radiation Oncology | Admitting: Radiation Oncology

## 2019-10-14 ENCOUNTER — Inpatient Hospital Stay: Payer: Medicare Other | Attending: Radiation Oncology

## 2019-10-14 DIAGNOSIS — C782 Secondary malignant neoplasm of pleura: Secondary | ICD-10-CM | POA: Diagnosis not present

## 2019-10-14 DIAGNOSIS — I871 Compression of vein: Secondary | ICD-10-CM | POA: Diagnosis not present

## 2019-10-14 DIAGNOSIS — C7951 Secondary malignant neoplasm of bone: Secondary | ICD-10-CM | POA: Diagnosis not present

## 2019-10-14 DIAGNOSIS — C3411 Malignant neoplasm of upper lobe, right bronchus or lung: Secondary | ICD-10-CM | POA: Diagnosis present

## 2019-10-14 DIAGNOSIS — Z510 Encounter for antineoplastic radiation therapy: Secondary | ICD-10-CM | POA: Diagnosis present

## 2019-10-15 ENCOUNTER — Other Ambulatory Visit: Payer: Self-pay

## 2019-10-15 ENCOUNTER — Ambulatory Visit: Payer: Medicare Other

## 2019-10-15 ENCOUNTER — Ambulatory Visit
Admission: RE | Admit: 2019-10-15 | Discharge: 2019-10-15 | Disposition: A | Discharge status: Home / Self Care | Payer: Medicare Other | Source: Ambulatory Visit | Attending: Radiation Oncology | Admitting: Radiation Oncology

## 2019-10-15 ENCOUNTER — Telehealth: Payer: Self-pay | Admitting: *Deleted

## 2019-10-15 DIAGNOSIS — Z510 Encounter for antineoplastic radiation therapy: Secondary | ICD-10-CM | POA: Diagnosis not present

## 2019-10-15 NOTE — Telephone Encounter (Signed)
I received a message from the pulmonary team that patient did not show to her appt with them.  I called patient to check on her and to reminder of her appt tomorrow with Dr. Julien Nordmann. I was unable to reach or leave vm message.

## 2019-10-16 ENCOUNTER — Inpatient Hospital Stay: Payer: Medicare Other | Admitting: Internal Medicine

## 2019-10-16 ENCOUNTER — Telehealth: Payer: Self-pay | Admitting: Medical Oncology

## 2019-10-16 ENCOUNTER — Ambulatory Visit: Payer: Medicare Other

## 2019-10-16 ENCOUNTER — Inpatient Hospital Stay: Payer: Medicare Other

## 2019-10-16 ENCOUNTER — Encounter: Payer: Self-pay | Admitting: Radiation Oncology

## 2019-10-16 ENCOUNTER — Ambulatory Visit
Admission: RE | Admit: 2019-10-16 | Discharge: 2019-10-16 | Disposition: A | Discharge status: Home / Self Care | Payer: Medicare Other | Source: Ambulatory Visit | Attending: Radiation Oncology | Admitting: Radiation Oncology

## 2019-10-16 ENCOUNTER — Encounter (HOSPITAL_COMMUNITY): Payer: Self-pay | Admitting: Internal Medicine

## 2019-10-16 DIAGNOSIS — Z510 Encounter for antineoplastic radiation therapy: Secondary | ICD-10-CM | POA: Diagnosis not present

## 2019-10-16 NOTE — Telephone Encounter (Signed)
No show -called pt -no answer.

## 2019-10-17 ENCOUNTER — Ambulatory Visit: Payer: Medicare Other

## 2019-10-17 ENCOUNTER — Telehealth: Payer: Self-pay

## 2019-10-17 NOTE — Telephone Encounter (Signed)
TC to Pt. To inquire about missed appointment and to let Pt. And to give her information about taking oral medication. No answer left message to return call to [facility]

## 2019-10-20 ENCOUNTER — Ambulatory Visit: Payer: Medicare Other

## 2019-10-21 ENCOUNTER — Encounter: Payer: Self-pay | Admitting: *Deleted

## 2019-10-21 ENCOUNTER — Ambulatory Visit: Payer: Medicare Other

## 2019-10-21 NOTE — Progress Notes (Signed)
Oncology Nurse Navigator Documentation  Oncology Nurse Navigator Flowsheets 10/21/2019  Navigator Location [facility]  Navigator Encounter Type Telephone;Other/Ms. Yost was a no show for an appt with Dr. Julien Nordmann last week.  I called her facility to see if patient was ok and to re-schedule. I was unable to reach any one but did leave vm message with DON  Telephone Outgoing Call  Treatment Phase Pre-Tx/Tx Discussion  Barriers/Navigation Needs Education  Education Other  Interventions Education  Acuity Level 2-Minimal Needs (1-2 Barriers Identified)  Coordination of Care -  Time Spent with Patient 15

## 2019-10-23 ENCOUNTER — Telehealth: Payer: Self-pay | Admitting: *Deleted

## 2019-10-23 NOTE — Telephone Encounter (Signed)
Called patient to inform of Pet Scan for 10-31-19 - arrival time- 2:30 pm, patient to have water only - 6 hrs. prior to test, test to be @ [facility], no answer, will call later

## 2019-10-24 ENCOUNTER — Encounter: Payer: Self-pay | Admitting: *Deleted

## 2019-10-24 NOTE — Progress Notes (Signed)
I received a call back from DON of facility.  She was able to help coordinate her appt PET on 2/12 and follow up with Dr. Julien Nordmann on 2/15.

## 2019-10-24 NOTE — Progress Notes (Signed)
Oncology Nurse Navigator Documentation  Oncology Nurse Navigator Flowsheets 10/24/2019  Navigator Location [facility]  Navigator Encounter Type Telephone/Ms. Haseman was a no show to her appt with Dr. Julien Nordmann.  I called her facility and spoke with [name] the [place].  I updated her that patient was a no show on 1/28 and that I had other appts to schedule for patient.  [name] states she will call me back with an update from her transportation system to see what happened to this appt.  I will wait to hear from her.   Telephone Outgoing Call  Treatment Phase Pre-Tx/Tx Discussion  Barriers/Navigation Needs Coordination of Care;Education  Education Other  Interventions Coordination of Care;Education  Acuity Level 2-Minimal Needs (1-2 Barriers Identified)  Coordination of Care Other  Education Method Verbal  Time Spent with Patient 49

## 2019-10-28 ENCOUNTER — Other Ambulatory Visit: Payer: Self-pay | Admitting: Pulmonary Disease

## 2019-10-28 ENCOUNTER — Telehealth: Payer: Self-pay | Admitting: *Deleted

## 2019-10-28 NOTE — Telephone Encounter (Signed)
Spoke with patient's son to make him aware of his moms upcoming PET scan and appointment with medical oncology team.  PET is scheduled for 10/31/2019 at 3:00 pm.  Informed to arrive at 2:30 pm, only water 6 hours prior to test.  Per Oncology Navigator note nursing facility aware of upcoming appointments.  Will continue to follow as necessary.  [name]. Leonie Green, BSN

## 2019-10-28 NOTE — Telephone Encounter (Signed)
CALLED PATIENT TO INFORM OF PET SCAN FOR 10-31-19, NO ANSWER, UNABLE TO LEAVE MESSAGE, NO ANSWERING MACHINE

## 2019-10-29 ENCOUNTER — Telehealth: Payer: Self-pay | Admitting: *Deleted

## 2019-10-29 ENCOUNTER — Telehealth: Payer: Self-pay | Admitting: Radiation Oncology

## 2019-10-29 ENCOUNTER — Encounter: Payer: Self-pay | Admitting: *Deleted

## 2019-10-29 NOTE — Progress Notes (Signed)
Oncology Nurse Navigator Documentation  Oncology Nurse Navigator Flowsheets 10/29/2019  Navigator Location [facility]  Navigator Encounter Type Telephone/I called patient's facility to remind them about patients PET scan.  I was updated patient is currently in the hospital in [place].  I updated Rad Onc on current status.   Telephone Outgoing Call  Treatment Phase Pre-Tx/Tx Discussion  Barriers/Navigation Needs Coordination of Care;Education  Education Other  Interventions Coordination of Care;Education  Acuity Level 2-Minimal Needs (1-2 Barriers Identified)  Coordination of Care Other  Education Method Verbal  Time Spent with Patient 30

## 2019-10-29 NOTE — Telephone Encounter (Signed)
Spoke with patient's son Emily Kim to let him know that we are aware that his mom is in the hospital.  She is scheduled for PET on 10/31/2019, should she still be in the hospital we would reschedule PET.  I informed him that his mom would be followed long term by Dr. Julien Nordmann since she has completed her radiation treatments.  Phone number provided for Dr. Worthy Flank nurse.  He asked if we had any updates on how the treatments worked.  I let him know we would know more after her PET and she will see Dr. Julien Nordmann on 11/03/2019.  He verbalized understanding of all information.  Will continue to follow as necessary.  [name]. Leonie Green, BSN

## 2019-10-29 NOTE — Telephone Encounter (Signed)
The patient's son Saylor Murry called yesterday asking about his mother. She had finished radiotherapy a few weeks ago and has been in a facility. I called but could not get through yesterday evening to see what his questions were. As an addendum, we were notified this morning that she was admitted. Nursing is going to try to touch base with Mr. Birenbaum to see what questions he has, but if they are about her hospitalization, we will defer to her hospitalist. Her PET is scheduled for this Friday, but if she remains hospitalized, this may need to be rescheduled until she's outpatient.

## 2019-10-31 ENCOUNTER — Ambulatory Visit (HOSPITAL_COMMUNITY): Payer: Medicare Other

## 2019-10-31 ENCOUNTER — Encounter (HOSPITAL_COMMUNITY): Payer: Self-pay

## 2019-10-31 MED ORDER — HEPARIN SOD (PORCINE) IN D5W 100 UNIT/ML IV SOLN
40.00 | INTRAVENOUS | Status: DC
Start: ? — End: 2019-10-31

## 2019-10-31 MED ORDER — GENERIC EXTERNAL MEDICATION
1.00 | Status: DC
Start: 2019-10-31 — End: 2019-10-31

## 2019-10-31 MED ORDER — ACETAMINOPHEN 325 MG PO TABS
650.00 | ORAL_TABLET | ORAL | Status: DC
Start: ? — End: 2019-10-31

## 2019-10-31 MED ORDER — SENNOSIDES-DOCUSATE SODIUM 8.6-50 MG PO TABS
1.00 | ORAL_TABLET | ORAL | Status: DC
Start: 2019-11-04 — End: 2019-10-31

## 2019-10-31 MED ORDER — ONDANSETRON 4 MG PO TBDP
4.00 | ORAL_TABLET | ORAL | Status: DC
Start: ? — End: 2019-10-31

## 2019-10-31 MED ORDER — DEXTROMETHORPHAN-GUAIFENESIN 10-100 MG/5ML PO LIQD
10.00 | ORAL | Status: DC
Start: ? — End: 2019-10-31

## 2019-10-31 MED ORDER — SODIUM CHLORIDE 1 G PO TABS
2.00 | ORAL_TABLET | ORAL | Status: DC
Start: 2019-11-03 — End: 2019-10-31

## 2019-10-31 MED ORDER — QUINTABS PO TABS
1.00 | ORAL_TABLET | ORAL | Status: DC
Start: 2019-11-04 — End: 2019-10-31

## 2019-10-31 MED ORDER — HYDRALAZINE HCL 20 MG/ML IJ SOLN
10.00 | INTRAMUSCULAR | Status: DC
Start: ? — End: 2019-10-31

## 2019-10-31 MED ORDER — GENERIC EXTERNAL MEDICATION
1.00 | Status: DC
Start: 2019-11-01 — End: 2019-10-31

## 2019-10-31 MED ORDER — FUROSEMIDE 10 MG/ML IJ SOLN
40.00 | INTRAMUSCULAR | Status: DC
Start: 2019-11-04 — End: 2019-10-31

## 2019-10-31 MED ORDER — HEPARIN SOD (PORCINE) IN D5W 100 UNIT/ML IV SOLN
18.00 | INTRAVENOUS | Status: DC
Start: ? — End: 2019-10-31

## 2019-10-31 MED ORDER — TRAMADOL HCL 50 MG PO TABS
50.00 | ORAL_TABLET | ORAL | Status: DC
Start: 2019-11-04 — End: 2019-10-31

## 2019-10-31 MED ORDER — TAMSULOSIN HCL 0.4 MG PO CAPS
0.40 | ORAL_CAPSULE | ORAL | Status: DC
Start: 2019-11-04 — End: 2019-10-31

## 2019-10-31 MED ORDER — HEPARIN SOD (PORCINE) IN D5W 100 UNIT/ML IV SOLN
80.00 | INTRAVENOUS | Status: DC
Start: ? — End: 2019-10-31

## 2019-10-31 MED ORDER — ENOXAPARIN SODIUM 100 MG/ML ~~LOC~~ SOLN
1.00 | SUBCUTANEOUS | Status: DC
Start: 2019-11-04 — End: 2019-10-31

## 2019-11-03 ENCOUNTER — Telehealth: Payer: Self-pay | Admitting: Medical Oncology

## 2019-11-03 ENCOUNTER — Encounter: Payer: Self-pay | Admitting: *Deleted

## 2019-11-03 ENCOUNTER — Inpatient Hospital Stay: Payer: Medicare Other | Attending: Radiation Oncology | Admitting: Internal Medicine

## 2019-11-03 ENCOUNTER — Inpatient Hospital Stay: Payer: Medicare Other

## 2019-11-03 MED ORDER — GENERIC EXTERNAL MEDICATION
1.00 | Status: DC
Start: 2019-11-04 — End: 2019-11-03

## 2019-11-03 MED ORDER — ALBUTEROL SULFATE (2.5 MG/3ML) 0.083% IN NEBU
2.50 | INHALATION_SOLUTION | RESPIRATORY_TRACT | Status: DC
Start: 2019-11-04 — End: 2019-11-03

## 2019-11-03 MED ORDER — GENERIC EXTERNAL MEDICATION
750.00 | Status: DC
Start: 2019-11-03 — End: 2019-11-03

## 2019-11-03 MED ORDER — PANTOPRAZOLE SODIUM 40 MG IV SOLR
40.00 | INTRAVENOUS | Status: DC
Start: 2019-11-04 — End: 2019-11-03

## 2019-11-03 NOTE — Progress Notes (Signed)
I received call from [facility] navigator she did not received foundation one report. I re-fax with a confirmation fax received.

## 2019-11-03 NOTE — Progress Notes (Signed)
Oncology Nurse Navigator Documentation  Oncology Nurse Navigator Flowsheets 11/03/2019  Navigator Location [facility]  Navigator Encounter Type Telephone/patient is currently in patient in [place] [facility].  Dr. Julien Nordmann would like an urgent referral on oncology due to the dx of NSCLC + EGFR.  With the help of Diane RN, I called the hospital and spoke with the patient's nurse.  As we were speaking, she contacted Dr. Lorelle Gibbs to give him an update on urgent referral.  I also called Pam, nurse navigator to give her an update on patient and if she needed any more information. I was unable to reach [facility] but did leave vm message.   Telephone Outgoing Call  Patient Visit Type Inpatient  Treatment Phase Pre-Tx/Tx Discussion  Barriers/Navigation Needs Coordination of Care;Education  Education Other  Interventions Coordination of Care;Education  Acuity Level 3-Moderate Needs (3-4 Barriers Identified)  Coordination of Care Other  Education Method Verbal  Time Spent with Patient 42

## 2019-11-03 NOTE — Telephone Encounter (Signed)
wants to transfer pt to [facility]. Will [facility] accept pt in transfer? Messaged [facility].

## 2019-11-03 NOTE — Telephone Encounter (Signed)
Spoke to [place] and he said his mother is at hospital in [facility] . He does not have any other information. Care everywhere reviewed. Dana to f/u.  [place] notified.

## 2019-11-04 ENCOUNTER — Inpatient Hospital Stay (HOSPITAL_COMMUNITY)
Admission: AD | Admit: 2019-11-04 | Discharge: 2019-11-17 | DRG: 207 | Disposition: E | Payer: Medicare Other | Source: Other Acute Inpatient Hospital | Attending: Internal Medicine | Admitting: Internal Medicine

## 2019-11-04 ENCOUNTER — Other Ambulatory Visit: Payer: Self-pay

## 2019-11-04 ENCOUNTER — Inpatient Hospital Stay (HOSPITAL_COMMUNITY): Payer: Medicare Other

## 2019-11-04 ENCOUNTER — Encounter (HOSPITAL_COMMUNITY): Payer: Self-pay | Admitting: Internal Medicine

## 2019-11-04 DIAGNOSIS — J9690 Respiratory failure, unspecified, unspecified whether with hypoxia or hypercapnia: Secondary | ICD-10-CM

## 2019-11-04 DIAGNOSIS — J9601 Acute respiratory failure with hypoxia: Secondary | ICD-10-CM | POA: Diagnosis present

## 2019-11-04 DIAGNOSIS — C7951 Secondary malignant neoplasm of bone: Secondary | ICD-10-CM | POA: Diagnosis present

## 2019-11-04 DIAGNOSIS — Z8542 Personal history of malignant neoplasm of other parts of uterus: Secondary | ICD-10-CM

## 2019-11-04 DIAGNOSIS — Z79891 Long term (current) use of opiate analgesic: Secondary | ICD-10-CM

## 2019-11-04 DIAGNOSIS — J690 Pneumonitis due to inhalation of food and vomit: Secondary | ICD-10-CM | POA: Diagnosis not present

## 2019-11-04 DIAGNOSIS — I871 Compression of vein: Secondary | ICD-10-CM | POA: Diagnosis present

## 2019-11-04 DIAGNOSIS — Y92239 Unspecified place in hospital as the place of occurrence of the external cause: Secondary | ICD-10-CM | POA: Diagnosis not present

## 2019-11-04 DIAGNOSIS — Z452 Encounter for adjustment and management of vascular access device: Secondary | ICD-10-CM

## 2019-11-04 DIAGNOSIS — N390 Urinary tract infection, site not specified: Secondary | ICD-10-CM | POA: Diagnosis present

## 2019-11-04 DIAGNOSIS — Z20822 Contact with and (suspected) exposure to covid-19: Secondary | ICD-10-CM | POA: Diagnosis present

## 2019-11-04 DIAGNOSIS — C779 Secondary and unspecified malignant neoplasm of lymph node, unspecified: Secondary | ICD-10-CM | POA: Diagnosis present

## 2019-11-04 DIAGNOSIS — C3491 Malignant neoplasm of unspecified part of right bronchus or lung: Secondary | ICD-10-CM | POA: Diagnosis present

## 2019-11-04 DIAGNOSIS — G9341 Metabolic encephalopathy: Secondary | ICD-10-CM | POA: Diagnosis not present

## 2019-11-04 DIAGNOSIS — J159 Unspecified bacterial pneumonia: Secondary | ICD-10-CM | POA: Diagnosis present

## 2019-11-04 DIAGNOSIS — T40605A Adverse effect of unspecified narcotics, initial encounter: Secondary | ICD-10-CM | POA: Diagnosis not present

## 2019-11-04 DIAGNOSIS — N179 Acute kidney failure, unspecified: Secondary | ICD-10-CM

## 2019-11-04 DIAGNOSIS — L8990 Pressure ulcer of unspecified site, unspecified stage: Secondary | ICD-10-CM | POA: Insufficient documentation

## 2019-11-04 DIAGNOSIS — J90 Pleural effusion, not elsewhere classified: Secondary | ICD-10-CM | POA: Diagnosis not present

## 2019-11-04 DIAGNOSIS — R570 Cardiogenic shock: Secondary | ICD-10-CM | POA: Diagnosis not present

## 2019-11-04 DIAGNOSIS — Z9911 Dependence on respirator [ventilator] status: Secondary | ICD-10-CM

## 2019-11-04 DIAGNOSIS — Z87891 Personal history of nicotine dependence: Secondary | ICD-10-CM | POA: Diagnosis not present

## 2019-11-04 DIAGNOSIS — K219 Gastro-esophageal reflux disease without esophagitis: Secondary | ICD-10-CM | POA: Diagnosis present

## 2019-11-04 DIAGNOSIS — R739 Hyperglycemia, unspecified: Secondary | ICD-10-CM | POA: Diagnosis not present

## 2019-11-04 DIAGNOSIS — J910 Malignant pleural effusion: Secondary | ICD-10-CM | POA: Diagnosis present

## 2019-11-04 DIAGNOSIS — Z7901 Long term (current) use of anticoagulants: Secondary | ICD-10-CM | POA: Diagnosis not present

## 2019-11-04 DIAGNOSIS — J9621 Acute and chronic respiratory failure with hypoxia: Secondary | ICD-10-CM | POA: Diagnosis present

## 2019-11-04 DIAGNOSIS — I469 Cardiac arrest, cause unspecified: Secondary | ICD-10-CM | POA: Diagnosis not present

## 2019-11-04 DIAGNOSIS — Z79899 Other long term (current) drug therapy: Secondary | ICD-10-CM

## 2019-11-04 DIAGNOSIS — T508X5A Adverse effect of diagnostic agents, initial encounter: Secondary | ICD-10-CM | POA: Diagnosis not present

## 2019-11-04 DIAGNOSIS — I2699 Other pulmonary embolism without acute cor pulmonale: Secondary | ICD-10-CM | POA: Diagnosis present

## 2019-11-04 DIAGNOSIS — L89012 Pressure ulcer of right elbow, stage 2: Secondary | ICD-10-CM | POA: Diagnosis present

## 2019-11-04 DIAGNOSIS — Z515 Encounter for palliative care: Secondary | ICD-10-CM | POA: Diagnosis not present

## 2019-11-04 DIAGNOSIS — E8770 Fluid overload, unspecified: Secondary | ICD-10-CM | POA: Diagnosis not present

## 2019-11-04 DIAGNOSIS — Z7189 Other specified counseling: Secondary | ICD-10-CM | POA: Diagnosis not present

## 2019-11-04 DIAGNOSIS — N189 Chronic kidney disease, unspecified: Secondary | ICD-10-CM | POA: Diagnosis present

## 2019-11-04 DIAGNOSIS — R0602 Shortness of breath: Secondary | ICD-10-CM

## 2019-11-04 DIAGNOSIS — Z66 Do not resuscitate: Secondary | ICD-10-CM | POA: Diagnosis not present

## 2019-11-04 DIAGNOSIS — R092 Respiratory arrest: Secondary | ICD-10-CM | POA: Diagnosis not present

## 2019-11-04 DIAGNOSIS — T368X5A Adverse effect of other systemic antibiotics, initial encounter: Secondary | ICD-10-CM | POA: Diagnosis not present

## 2019-11-04 DIAGNOSIS — R0600 Dyspnea, unspecified: Secondary | ICD-10-CM

## 2019-11-04 DIAGNOSIS — R579 Shock, unspecified: Secondary | ICD-10-CM | POA: Diagnosis present

## 2019-11-04 DIAGNOSIS — Y95 Nosocomial condition: Secondary | ICD-10-CM | POA: Diagnosis present

## 2019-11-04 DIAGNOSIS — Z0189 Encounter for other specified special examinations: Secondary | ICD-10-CM

## 2019-11-04 DIAGNOSIS — R0902 Hypoxemia: Secondary | ICD-10-CM

## 2019-11-04 DIAGNOSIS — Z90710 Acquired absence of both cervix and uterus: Secondary | ICD-10-CM | POA: Diagnosis not present

## 2019-11-04 DIAGNOSIS — I129 Hypertensive chronic kidney disease with stage 1 through stage 4 chronic kidney disease, or unspecified chronic kidney disease: Secondary | ICD-10-CM | POA: Diagnosis present

## 2019-11-04 DIAGNOSIS — L89152 Pressure ulcer of sacral region, stage 2: Secondary | ICD-10-CM | POA: Diagnosis present

## 2019-11-04 DIAGNOSIS — D6489 Other specified anemias: Secondary | ICD-10-CM | POA: Diagnosis present

## 2019-11-04 DIAGNOSIS — Z923 Personal history of irradiation: Secondary | ICD-10-CM

## 2019-11-04 DIAGNOSIS — N170 Acute kidney failure with tubular necrosis: Secondary | ICD-10-CM | POA: Diagnosis present

## 2019-11-04 DIAGNOSIS — C3411 Malignant neoplasm of upper lobe, right bronchus or lung: Secondary | ICD-10-CM | POA: Diagnosis present

## 2019-11-04 DIAGNOSIS — J9622 Acute and chronic respiratory failure with hypercapnia: Secondary | ICD-10-CM | POA: Diagnosis present

## 2019-11-04 DIAGNOSIS — I468 Cardiac arrest due to other underlying condition: Secondary | ICD-10-CM | POA: Diagnosis not present

## 2019-11-04 DIAGNOSIS — D6481 Anemia due to antineoplastic chemotherapy: Secondary | ICD-10-CM | POA: Diagnosis present

## 2019-11-04 DIAGNOSIS — I82623 Acute embolism and thrombosis of deep veins of upper extremity, bilateral: Secondary | ICD-10-CM | POA: Diagnosis present

## 2019-11-04 HISTORY — PX: IR FLUORO GUIDE CV LINE RIGHT: IMG2283

## 2019-11-04 HISTORY — PX: IR US GUIDE VASC ACCESS RIGHT: IMG2390

## 2019-11-04 LAB — BLOOD GAS, ARTERIAL
Acid-Base Excess: 7.5 mmol/L — ABNORMAL HIGH (ref 0.0–2.0)
Bicarbonate: 31 mmol/L — ABNORMAL HIGH (ref 20.0–28.0)
Drawn by: 295031
FIO2: 32
O2 Saturation: 95.3 %
Patient temperature: 98.6
pCO2 arterial: 39.8 mmHg (ref 32.0–48.0)
pH, Arterial: 7.503 — ABNORMAL HIGH (ref 7.350–7.450)
pO2, Arterial: 71.5 mmHg — ABNORMAL LOW (ref 83.0–108.0)

## 2019-11-04 LAB — CBC WITH DIFFERENTIAL/PLATELET
Abs Immature Granulocytes: 0.09 10*3/uL — ABNORMAL HIGH (ref 0.00–0.07)
Basophils Absolute: 0 10*3/uL (ref 0.0–0.1)
Basophils Relative: 0 %
Eosinophils Absolute: 0.1 10*3/uL (ref 0.0–0.5)
Eosinophils Relative: 2 %
HCT: 25.9 % — ABNORMAL LOW (ref 36.0–46.0)
Hemoglobin: 7.8 g/dL — ABNORMAL LOW (ref 12.0–15.0)
Immature Granulocytes: 1 %
Lymphocytes Relative: 6 %
Lymphs Abs: 0.5 10*3/uL — ABNORMAL LOW (ref 0.7–4.0)
MCH: 26 pg (ref 26.0–34.0)
MCHC: 30.1 g/dL (ref 30.0–36.0)
MCV: 86.3 fL (ref 80.0–100.0)
Monocytes Absolute: 0.6 10*3/uL (ref 0.1–1.0)
Monocytes Relative: 8 %
Neutro Abs: 6.5 10*3/uL (ref 1.7–7.7)
Neutrophils Relative %: 83 %
Platelets: 235 10*3/uL (ref 150–400)
RBC: 3 MIL/uL — ABNORMAL LOW (ref 3.87–5.11)
RDW: 19 % — ABNORMAL HIGH (ref 11.5–15.5)
WBC: 7.8 10*3/uL (ref 4.0–10.5)
nRBC: 0 % (ref 0.0–0.2)

## 2019-11-04 LAB — MAGNESIUM
Magnesium: 1.3 mg/dL — ABNORMAL LOW (ref 1.7–2.4)
Magnesium: 1.3 mg/dL — ABNORMAL LOW (ref 1.7–2.4)

## 2019-11-04 LAB — PROTIME-INR
INR: 1.3 — ABNORMAL HIGH (ref 0.8–1.2)
Prothrombin Time: 15.6 seconds — ABNORMAL HIGH (ref 11.4–15.2)

## 2019-11-04 LAB — COMPREHENSIVE METABOLIC PANEL
ALT: 15 U/L (ref 0–44)
AST: 26 U/L (ref 15–41)
Albumin: 2.2 g/dL — ABNORMAL LOW (ref 3.5–5.0)
Alkaline Phosphatase: 192 U/L — ABNORMAL HIGH (ref 38–126)
Anion gap: 10 (ref 5–15)
BUN: 15 mg/dL (ref 8–23)
CO2: 29 mmol/L (ref 22–32)
Calcium: 8.4 mg/dL — ABNORMAL LOW (ref 8.9–10.3)
Chloride: 98 mmol/L (ref 98–111)
Creatinine, Ser: 1.08 mg/dL — ABNORMAL HIGH (ref 0.44–1.00)
GFR calc Af Amer: 57 mL/min — ABNORMAL LOW (ref >60)
GFR calc non Af Amer: 49 mL/min — ABNORMAL LOW (ref >60)
Glucose, Bld: 129 mg/dL — ABNORMAL HIGH (ref 70–99)
Potassium: 3.1 mmol/L — ABNORMAL LOW (ref 3.5–5.1)
Sodium: 137 mmol/L (ref 135–145)
Total Bilirubin: 0.7 mg/dL (ref 0.3–1.2)
Total Protein: 5.9 g/dL — ABNORMAL LOW (ref 6.5–8.1)

## 2019-11-04 LAB — SARS CORONAVIRUS 2 (TAT 6-24 HRS): SARS Coronavirus 2: NEGATIVE

## 2019-11-04 LAB — MRSA PCR SCREENING: MRSA by PCR: NEGATIVE

## 2019-11-04 LAB — VANCOMYCIN, RANDOM: Vancomycin Rm: 28

## 2019-11-04 MED ORDER — MAGNESIUM OXIDE 400 (241.3 MG) MG PO TABS
400.0000 mg | ORAL_TABLET | Freq: Two times a day (BID) | ORAL | Status: AC
  Administered 2019-11-04 – 2019-11-05 (×2): 400 mg via ORAL
  Filled 2019-11-04 (×2): qty 1

## 2019-11-04 MED ORDER — GENERIC EXTERNAL MEDICATION
12.50 | Status: DC
Start: 2019-11-03 — End: 2019-11-04

## 2019-11-04 MED ORDER — LIDOCAINE HCL 1 % IJ SOLN
INTRAMUSCULAR | Status: DC | PRN
  Administered 2019-11-04: 5 mL

## 2019-11-04 MED ORDER — HYDRALAZINE HCL 20 MG/ML IJ SOLN
10.0000 mg | INTRAMUSCULAR | Status: DC | PRN
  Administered 2019-11-04 – 2019-11-08 (×5): 10 mg via INTRAVENOUS
  Filled 2019-11-04 (×5): qty 1

## 2019-11-04 MED ORDER — ONDANSETRON HCL 4 MG/2ML IJ SOLN: 4.0000 mg | Freq: Four times a day (QID) | INTRAMUSCULAR | Status: DC | PRN

## 2019-11-04 MED ORDER — TAMSULOSIN HCL 0.4 MG PO CAPS
0.4000 mg | ORAL_CAPSULE | Freq: Every day | ORAL | Status: DC
  Administered 2019-11-04 – 2019-11-07 (×4): 0.4 mg via ORAL
  Filled 2019-11-04 (×5): qty 1

## 2019-11-04 MED ORDER — PANTOPRAZOLE SODIUM 40 MG IV SOLR
40.0000 mg | Freq: Two times a day (BID) | INTRAVENOUS | Status: DC
  Administered 2019-11-04 (×2): 40 mg via INTRAVENOUS
  Filled 2019-11-04 (×2): qty 40

## 2019-11-04 MED ORDER — ACETAMINOPHEN 650 MG RE SUPP: 650.0000 mg | Freq: Four times a day (QID) | RECTAL | Status: DC | PRN

## 2019-11-04 MED ORDER — ADULT MULTIVITAMIN W/MINERALS CH
1.0000 | ORAL_TABLET | Freq: Every day | ORAL | Status: DC
  Administered 2019-11-04 – 2019-11-07 (×4): 1 via ORAL
  Filled 2019-11-04 (×5): qty 1

## 2019-11-04 MED ORDER — SODIUM CHLORIDE 0.9 % IV SOLN
2.0000 g | Freq: Two times a day (BID) | INTRAVENOUS | Status: DC
  Administered 2019-11-04 – 2019-11-07 (×8): 2 g via INTRAVENOUS
  Filled 2019-11-04 (×8): qty 2

## 2019-11-04 MED ORDER — FUROSEMIDE 10 MG/ML PO SOLN
40.0000 mg | Freq: Every day | ORAL | Status: DC
  Administered 2019-11-04 – 2019-11-06 (×3): 40 mg via ORAL
  Filled 2019-11-04 (×3): qty 4

## 2019-11-04 MED ORDER — POTASSIUM CHLORIDE CRYS ER 20 MEQ PO TBCR
40.0000 meq | EXTENDED_RELEASE_TABLET | Freq: Once | ORAL | Status: AC
  Administered 2019-11-04: 07:00:00 40 meq via ORAL
  Filled 2019-11-04: qty 2

## 2019-11-04 MED ORDER — ACETAMINOPHEN 325 MG PO TABS
650.0000 mg | ORAL_TABLET | Freq: Four times a day (QID) | ORAL | Status: DC | PRN
  Administered 2019-11-05 – 2019-11-06 (×3): 650 mg via ORAL
  Filled 2019-11-04 (×3): qty 2

## 2019-11-04 MED ORDER — TRAMADOL HCL 50 MG PO TABS
50.0000 mg | ORAL_TABLET | Freq: Every day | ORAL | Status: DC
  Administered 2019-11-04 – 2019-11-07 (×4): 50 mg via ORAL
  Filled 2019-11-04 (×5): qty 1

## 2019-11-04 MED ORDER — METOPROLOL TARTRATE 12.5 MG HALF TABLET
12.5000 mg | ORAL_TABLET | Freq: Two times a day (BID) | ORAL | Status: DC
  Administered 2019-11-04 – 2019-11-07 (×6): 12.5 mg via ORAL
  Filled 2019-11-04 (×9): qty 1

## 2019-11-04 MED ORDER — ENSURE ENLIVE PO LIQD
237.0000 mL | Freq: Two times a day (BID) | ORAL | Status: DC
  Administered 2019-11-04 – 2019-11-07 (×7): 237 mL via ORAL

## 2019-11-04 MED ORDER — CHLORHEXIDINE GLUCONATE CLOTH 2 % EX PADS
6.0000 | MEDICATED_PAD | Freq: Every day | CUTANEOUS | Status: DC
  Administered 2019-11-04: 11:00:00 6 via TOPICAL

## 2019-11-04 MED ORDER — SENNOSIDES-DOCUSATE SODIUM 8.6-50 MG PO TABS
1.0000 | ORAL_TABLET | Freq: Two times a day (BID) | ORAL | Status: DC
  Administered 2019-11-04 – 2019-11-07 (×6): 1 via ORAL
  Filled 2019-11-04 (×8): qty 1

## 2019-11-04 MED ORDER — GUAIFENESIN-DM 100-10 MG/5ML PO SYRP: 10.0000 mL | ORAL_SOLUTION | ORAL | Status: DC | PRN

## 2019-11-04 MED ORDER — ONDANSETRON HCL 4 MG PO TABS: 4.0000 mg | ORAL_TABLET | Freq: Four times a day (QID) | ORAL | Status: DC | PRN

## 2019-11-04 MED ORDER — ALBUTEROL SULFATE (2.5 MG/3ML) 0.083% IN NEBU
2.5000 mg | INHALATION_SOLUTION | Freq: Four times a day (QID) | RESPIRATORY_TRACT | Status: DC
  Administered 2019-11-04 (×3): 2.5 mg via RESPIRATORY_TRACT
  Filled 2019-11-04 (×3): qty 3

## 2019-11-04 MED ORDER — VANCOMYCIN VARIABLE DOSE PER UNSTABLE RENAL FUNCTION (PHARMACIST DOSING): Status: DC

## 2019-11-04 MED ORDER — ENOXAPARIN SODIUM 100 MG/ML ~~LOC~~ SOLN
90.0000 mg | Freq: Two times a day (BID) | SUBCUTANEOUS | Status: DC
  Administered 2019-11-04 – 2019-11-07 (×6): 90 mg via SUBCUTANEOUS
  Filled 2019-11-04 (×8): qty 0.9

## 2019-11-04 NOTE — Progress Notes (Signed)
Initial Nutrition Assessment  DOCUMENTATION CODES:   Not applicable  INTERVENTION:  - continue Ensure Enlive BID, each supplement provides 350 kcal and 20 grams of protein. - will order Magic Cup with dinner meals, each supplement provides 290 kcal and 9 grams of protein.   NUTRITION DIAGNOSIS:   Increased nutrient needs related to acute illness, chronic illness, cancer and cancer related treatments as evidenced by estimated needs.  GOAL:   Patient will meet greater than or equal to 90% of their needs  MONITOR:   PO intake, Supplement acceptance, Labs, Weight trends, Skin  REASON FOR ASSESSMENT:   Malnutrition Screening Tool  ASSESSMENT:   78 y.o. female with medical history of metastatic, stage 4 NSCLC s/p radiation, SVC syndrome. She was admitted to [facility] 2/9-2/15 before being transferred to [facility]. She went to [facility] due to SOB. CT showed persistent pulmonary embolism and Doppler showed bilateral BUE DVTs. She has a Pleurx and PTA it was found to be draining poorly.  No intakes documented since admission. Patient seen by this RD in January. She reports ongoing fair to poor appetite since that time, but did not provide further detail even when asked. She reports abdominal discomfort today: pressure/cramping and nausea. She did not eat breakfast because of this and is not interested in RD ordering anything for her at this time.  She was drinking Ensure supplements at home and is aware that they have already been ordered here; patient appreciative of this.   Per chart review, weight today is 194 lb and weight on 1/22 was 199 lb. This indicates 5 lb weight loss (2.5% body weight).   Per notes: - malignant effusion, DVTs - possible PNA and possible UTI - progressively worsening anemia--hemoptysis and hemoccult positive - ARF - metastatic adenocarcinoma of the lung/NSCLC   Labs reviewed; K: 3.1 mmol/l, creatinine: 1.08 mg/dl, Ca: 8.4 mg/dl, Alk Phos  elevated, GFR: 57 ml/min. Medications reviewed; 40 mg oral lasix/day, daily multivitamin with minerals, 40 mg IV protonix BID, 40 mEq Klor-Con x1 dose 2/16, 1 tablet senokot BID.    NUTRITION - FOCUSED PHYSICAL EXAM:  completed; no muscle and no fat wasting; mild edema to BLE and deep pitting edema to BUE.  Diet Order:   Diet Order            Diet Heart Room service appropriate? Yes; Fluid consistency: Thin  Diet effective now              EDUCATION NEEDS:   No education needs have been identified at this time  Skin:  Skin Assessment: Skin Integrity Issues: Skin Integrity Issues:: Stage II Stage II: sacrum; R elbow  Last BM:  2/16  Height:   Ht Readings from Last 1 Encounters:  10/23/2019 5' 8" (1.727 m)    Weight:   Wt Readings from Last 1 Encounters:  11/09/2019 88 kg    Estimated Nutritional Needs:  Kcal:  2200-2470 kcal Protein:  110-125 grams Fluid:  >/= 1.8 L/day     Jessica Ostheim, MS, RD, LDN, CNSC Inpatient Clinical Dietitian RD pager # available in AMION  After hours/weekend pager # available in AMION  

## 2019-11-04 NOTE — Progress Notes (Addendum)
HEMATOLOGY-ONCOLOGY PROGRESS NOTE  SUBJECTIVE: The patient was transferred from [facility] regional overnight.  She was transferred from the skilled nursing facility due to shortness of breath.  Chest x-ray at their facility showed a large pleural effusion and CT scan showed persistent PE with loculated pleural effusion.  Doppler ultrasound showed bilateral upper extremity DVTs.  She has a Pleurx catheter in place which has not been draining very well.  When seen today, the patient reports that her breathing is somewhat better however, she still remains quite short of breath.  She is not complaining of any chest pain.  Has an intermittent cough without hemoptysis.  She has significant edema in the bilateral upper extremities.  Nurse reports difficulty obtaining an IV site.  REVIEW OF SYSTEMS:   Constitutional: Denies fevers, chills  Respiratory: Reports ongoing shortness of breath which she states is improved Cardiovascular: Denies palpitation, chest discomfort Gastrointestinal:  Denies nausea, heartburn or change in bowel habits Skin: Denies abnormal skin rashes Lymphatics: Denies new lymphadenopathy or easy bruising Neurological:Denies numbness, tingling or new weaknesses Behavioral/Psych: Mood is stable, no new changes  Extremities: Bilateral upper extremity edema, no lower extremity edema All other systems were reviewed with the patient and are negative.  I have reviewed the past medical history, past surgical history, social history and family history with the patient and they are unchanged from previous note.   PHYSICAL EXAMINATION: ECOG PERFORMANCE STATUS: 3 - Symptomatic, >50% confined to bed  Vitals:   10/30/2019 1000 10/30/2019 1100  BP: (!) 142/74 (!) 168/79  Pulse: (!) 107 100  Resp: (!) 26 (!) 24  Temp:    SpO2: 96% 99%   Filed Weights   11/10/2019 0449  Weight: 194 lb 0.1 oz (88 kg)    Intake/Output from previous day: No intake/output data recorded.  GENERAL: Alert,  appears short of breath LUNGS: clear to auscultation and percussion with normal breathing effort HEART: regular rate & rhythm and no murmurs and pitting edema in the bilateral upper extremities, no lower extremity edema ABDOMEN:abdomen soft, non-tender and normal bowel sounds Musculoskeletal:no cyanosis of digits and no clubbing  NEURO: alert & oriented x 3 with fluent speech, no focal motor/sensory deficits  LABORATORY DATA:  I have reviewed the data as listed CMP Latest Ref Rng & Units 11/01/2019 10/10/2019 10/09/2019  Glucose 70 - 99 mg/dL 129(H) - -  BUN 8 - 23 mg/dL 15 - -  Creatinine 0.44 - 1.00 mg/dL 1.08(H) - -  Sodium 135 - 145 mmol/L 137 125(L) 125(L)  Potassium 3.5 - 5.1 mmol/L 3.1(L) - -  Chloride 98 - 111 mmol/L 98 - -  CO2 22 - 32 mmol/L 29 - -  Calcium 8.9 - 10.3 mg/dL 8.4(L) - -  Total Protein 6.5 - 8.1 g/dL 5.9(L) - -  Total Bilirubin 0.3 - 1.2 mg/dL 0.7 - -  Alkaline Phos 38 - 126 U/L 192(H) - -  AST 15 - 41 U/L 26 - -  ALT 0 - 44 U/L 15 - -    Lab Results  Component Value Date   WBC 7.8 11/05/2019   HGB 7.8 (L) 10/24/2019   HCT 25.9 (L) 10/30/2019   MCV 86.3 10/31/2019   PLT 235 10/24/2019   NEUTROABS 6.5 11/06/2019    DG Chest 2 View  Result Date: 10/05/2019 CLINICAL DATA:  Pleural effusion EXAM: CHEST - 2 VIEW COMPARISON:  Radiograph 10/05/2019 FINDINGS: Redemonstration of an inferiorly directed right chest tube. Tip terminates in the mid to lower right thorax  medially. Redemonstration of the partially loculated moderate right pleural effusion with fluid tracking along the fissures. Diffusely increased opacity in the right hemithorax is compatible with passive areas of atelectasis and fluid layering posteriorly. Nodular opacities are again seen in the left lung. Small left effusion is present as well. Visible portions of the cardiac silhouette are unchanged from prior with a calcified aorta. IMPRESSION: Stable appearance of the chest with moderate right pleural  effusion and small left pleural effusion. Stable nodular opacities in the left lung. Electronically Signed   By: Lovena Le M.D.   On: 10/05/2019 15:54   DG CHEST PORT 1 VIEW  Result Date: 11/09/2019 CLINICAL DATA:  Increasing shortness of breath. EXAM: PORTABLE CHEST 1 VIEW COMPARISON:  Most recent radiograph 10/31/2019, most recent CT 11/02/2019. Recent imaging performed at [place]: Right chest tube/PleurX catheter remains in place. Unchanged pleuroparenchymal opacities throughout the right hemithorax likely combination of residual pleural fluid and known right lung mass. No visualized pneumothorax. Left pleural effusion and associated basilar opacity slightly progressed. Improving left suprahilar opacities. Stable heart size and mediastinal contours. IMPRESSION: 1. Unchanged pleuroparenchymal opacities throughout the right hemithorax likely combination of residual pleural fluid and known right lung mass. Right chest tube/PleurX catheter remains in place. 2. Left pleural effusion and associated basilar opacity slightly progressed. Improving left suprahilar opacities. Electronically Signed   By: Keith Rake M.D.   On: 11/05/2019 03:21    ASSESSMENT AND PLAN: This is a pleasant 79 year old African-American female recently diagnosed with stage IV non-small cell lung cancer, adenocarcinoma.  She presented with widely metastatic disease involving the right hilar and suprahilar mass as well as bilateral pulmonary metastases and lymphangitic carcinomatosis as well as multiple osseous metastasis with SVC syndrome.  She has completed radiation for SVC syndrome.  Now admitted for worsening shortness of breath and bilateral upper extremity DVTs.  She is currently on Lovenox 90 mg twice daily for her bilateral upper extremity DVTs and history of PE.  The patient's molecular studies returned which shows that she is EGFR positive.  We are in the process of obtaining Tagrisso from the drug  manufacturer and anticipate that we will have this medication on hand within the next 48 hours.  His medication only brought to the [facility] long inpatient pharmacy to begin administration.  Dr. Julien Nordmann will see this patient later today to have a further discussion regarding his treatment plan and to review adverse effects.   LOS: 0 days   Mikey Bussing, DNP, AGPCNP-BC, AOCNP 11/08/2019  ADDENDUM: Hematology/Oncology Attending: I had a face-to-face encounter with the patient today.  I agree with the above note and I recommended her care plan. 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  The patient is status post palliative radiotherapy for large suprahilar mass with SVC syndrome at that time.  She was supposed to follow-up in the clinic for evaluation and discussion of her treatment options but the patient was at a skilled nursing facility and she was unable to come to the cancer center for discussion of her treatment options. She was recently admitted to [facility] hospital and transferred to [facility] complaining of worsening shortness of breath and imaging studies showed bilateral upper extremity deep venous thrombosis with a history of pulmonary embolism.  The patient is currently on treatment with Lovenox 90 mg twice daily which is the appropriate  treatment for her condition at this point. I discussed with the patient her treatment options and recommended for her treatment with oral targeted therapy with Tagrisso 80 mg p.o. daily.  I am working for the Chief of Staff as well as the oncology pharmacist for oral medication to start the patient on this treatment as soon as possible even during her hospitalization.  This is expected to have been in the next 1-2 days. Thank you so much for taking good care of Emily Kim.  I will continue to  follow up the patient with you and assist in her management on as-needed basis.  Disclaimer: This note was dictated with voice recognition software. Similar sounding words can inadvertently be transcribed and may be missed upon review. Eilleen Kempf, MD

## 2019-11-04 NOTE — Progress Notes (Signed)
Pharmacy Antibiotic Note  Emily Kim is a 79 y.o. female admitted on 11/05/2019 with pneumonia.  Pharmacy has been consulted for cefepime and vancomycin dosing. Last vancomycin dose was 1g on 2/15 @2000  (given at [facility]). VR 24 hours after last dose was 28.   Plan: Will check another VR at 0500 tomorrow F/u VR to determine when to give next dose Goal AUC = 400-550 Continue cefepime as ordered  F/u scr/cultures/levels  Height: 5\' 8"  (172.7 cm) Weight: 194 lb 0.1 oz (88 kg) IBW/kg (Calculated) : 63.9  Temp (24hrs), Avg:97.9 F (36.6 C), Min:97.6 F (36.4 C), Max:98.4 F (36.9 C)  Recent Labs  Lab 10/21/2019 0304 11/08/2019 1854  WBC 7.8  --   CREATININE 1.08*  --   VANCORANDOM  --  28    Estimated Creatinine Clearance: 49.8 mL/min (A) (by C-G formula based on SCr of 1.08 mg/dL (H)).    No Known Allergies  Antimicrobials this admission: 2/16 cefepime >>  2/16 vancomycin >>   Dose adjustments this admission:   Microbiology results:  BCx:   UCx:    Sputum:    MRSA PCR: neg  Thank you for allowing pharmacy to be a part of this patient's care.  Darnelle Bos 11/05/2019 7:59 PM

## 2019-11-04 NOTE — H&P (Signed)
History and Physical    Emily Kim KWI:[id number] DOB: 10-Nov-1940 DOA: 11/03/2019  PCP: Patient, No Pcp Per  Patient coming from: Patient was transferred from [facility].  Chief Complaint: Shortness of breath.  HPI: Emily Kim is a 79 y.o. female with history of metastatic non-small cell lung cancer status post radiation and during the stay patient also was diagnosed with acute pulmonary embolism placed on Eliquis and also had received radiation therapy for SVC syndrome discharged on April 07, 2020 to rehab post admitted at [facility] on October 28, 2019 through November 03, 2019 when patient was transferred here.  Patient was taken to [facility] for shortness of breath and over there patient had x-ray showing large pleural effusion CT scan shows persistent pulmonary embolism with loculated pleural effusion and Doppler showed bilateral upper extremity DVTs persistent.  It was noted that patient's Pleurx catheter was not draining well at rehab and it was started to be drained.  Also noticed was patient's creatinine has increased from 0.5-1.5 while at [facility] and hemoglobin was found to have a gradual decline.  Stool for occult blood was positive but given the patient's multiple comorbid condition gastric intervention was not planned at that time.  Patient also started having mild hemoptysis.  Since patient is known to [facility] and his oncologist Dr. Julien Nordmann was contacted and patient was transferred to [facility].  At [facility] patient had 2 CAT scans done 1 on February 10 and another one on November 02, 2019.  Impression was persistent moderate right-sided pleural effusion likely multifocal loculated not significantly changed.  Right-sided chest tube is stable in position with tip in the right lung apex.  Stable left pleural effusion small to moderate sized right perihilar mass and some bilateral arm pain appears  stable and short-term interval.  The precarinal and subcarinal mass seen to encase and significantly narrowed the right main pulmonary artery on the earlier contrast exam.  Chronic but opacities within the right lower lobe on previous study have improved.  Ill-defined fluid edema throughout the subcutaneous soft tissues of the chest and visualized portion of upper extremities indicating anasarca.  Patient's hemoglobin was around 8.4 platelets 176 creatinine had worsened to 1.5 as per the report.  Patient was empirically placed on vancomycin and cefepime for possible pneumonia and UTI.  Urine cultures grew E. Coli.  Since there was concern for possible Eliquis failure patient was transitioned to Lovenox for the pulmonary embolism and upper extremity DVT.  ED Course: Patient was a direct admit.  Review of Systems: As per HPI, rest all negative.   Past Medical History:  Diagnosis Date  . Uterine cancer [facility])     Past Surgical History:  Procedure Laterality Date  . ABDOMINAL HYSTERECTOMY    . VIDEO BRONCHOSCOPY WITH ENDOBRONCHIAL ULTRASOUND N/A 09/25/2019   Procedure: VIDEO BRONCHOSCOPY WITH ENDOBRONCHIAL ULTRASOUND;  Surgeon: Melrose Nakayama, MD;  Location: [place];  Service: Thoracic;  Laterality: N/A;     reports that she quit smoking about 30 years ago. She has never used smokeless tobacco. She reports previous alcohol use. She reports that she does not use drugs.  No Known Allergies  Family History  Problem Relation Age of Onset  . Cancer Neg Hx     Prior to Admission medications   Medication Sig Start Date End Date Taking? Authorizing Provider  acetaminophen (TYLENOL) 325 MG tablet Take 650 mg by mouth every 6 (  six) hours as needed for mild pain.   Yes [provider]  albuterol (PROVENTIL) (2.5 MG/3ML) 0.083% nebulizer solution Take 2.5 mg by nebulization every 6 (six) hours.   Yes [provider]  ceFEPIme 1 g in sodium chloride 0.9 % 100 mL Inject 1 g into  the vein every 6 (six) hours. Infuse over 30 mins   Yes [provider]  enoxaparin (LOVENOX) 100 MG/ML injection Inject 90 mg into the skin every 12 (twelve) hours.   Yes [provider]  furosemide (LASIX) 10 MG/ML solution Take 40 mg by mouth daily.   Yes [provider]  guaiFENesin-dextromethorphan (ROBITUSSIN DM) 100-10 MG/5ML syrup Take 10 mLs by mouth every 4 (four) hours as needed for cough. 10/09/19  Yes Shelly Coss, MD  hydrALAZINE (APRESOLINE) 20 MG/ML injection Inject 10 mg into the vein every 4 (four) hours as needed (SBP>165 or DBP> 110).   Yes [provider]  metoprolol tartrate (LOPRESSOR) 25 MG tablet Take 12.5 mg by mouth 2 (two) times daily.   Yes [provider]  Multiple Vitamin (MULTIVITAMIN WITH MINERALS) TABS tablet Take 1 tablet by mouth daily. 10/10/19  Yes Shelly Coss, MD  ondansetron (ZOFRAN-ODT) 4 MG disintegrating tablet Take 4 mg by mouth every 8 (eight) hours as needed for nausea or vomiting.   Yes [provider]  pantoprazole (PROTONIX) 40 MG injection Inject 40 mg into the vein 2 (two) times daily.   Yes [provider]  senna-docusate (SENOKOT-S) 8.6-50 MG tablet Take 1 tablet by mouth 2 (two) times daily. 10/09/19  Yes Shelly Coss, MD  tamsulosin (FLOMAX) 0.4 MG CAPS capsule Take 1 capsule (0.4 mg total) by mouth daily. 10/10/19  Yes Shelly Coss, MD  traMADol (ULTRAM) 50 MG tablet Take 50 mg by mouth daily.   Yes [provider]  Vancomycin HCl 1000 MG/200ML SOLN Inject 1,000 mg into the vein every 18 (eighteen) hours. Infuse over 60 minutes   Yes [provider]  apixaban (ELIQUIS) 5 MG TABS tablet Take 1 tablet (5 mg total) by mouth 2 (two) times daily. Patient not taking: Reported on 11/06/2019 10/09/19   Shelly Coss, MD  feeding supplement, ENSURE ENLIVE, (ENSURE ENLIVE) LIQD Take 237 mLs by mouth 2 (two) times daily between meals. 10/09/19   Shelly Coss, MD    furosemide (LASIX) 20 MG tablet Take 1 tablet (20 mg total) by mouth daily. Patient not taking: Reported on 11/11/2019 10/10/19   Shelly Coss, MD  ipratropium-albuterol (DUONEB) 0.5-2.5 (3) MG/3ML SOLN Take 3 mLs by nebulization every 4 (four) hours as needed. Patient not taking: Reported on 11/01/2019 10/09/19   Shelly Coss, MD  sodium chloride 1 g tablet Take 2 tablets (2 g total) by mouth 2 (two) times daily with a meal. Patient not taking: Reported on 11/03/2019 10/09/19   Shelly Coss, MD    Physical Exam: Constitutional: Moderately built and nourished. Vitals:   10/21/2019 0300 10/26/2019 0449  BP: (!) 173/70   Pulse: 94   Resp: (!) 29   Temp: 98.4 F (36.9 C)   TempSrc: Oral Oral  SpO2: 99%   Weight:  88 kg  Height:  5\' 8"  (1.727 m)   Eyes: Anicteric no pallor. ENMT: No discharge from the ears eyes nose or mouth. Neck: No mass felt.  No neck rigidity. Respiratory: No rhonchi or crepitations. Cardiovascular: S1-S2 heard. Abdomen: Soft nontender bowel sounds present. Musculoskeletal: Bilateral upper extremity edema present. Skin: No rash. Neurologic: Alert awake oriented to time  place and person.  Moves all extremities. Psychiatric: Appears normal but normal affect.   Labs on Admission: I have personally reviewed following labs and imaging studies  CBC: Recent Labs  Lab 10/20/2019 0304  WBC 7.8  NEUTROABS 6.5  HGB 7.8*  HCT 25.9*  MCV 86.3  PLT 976   Basic Metabolic Panel: Recent Labs  Lab 11/01/2019 0304  NA 137  K 3.1*  CL 98  CO2 29  GLUCOSE 129*  BUN 15  CREATININE 1.08*  CALCIUM 8.4*   GFR: Estimated Creatinine Clearance: 49.8 mL/min (A) (by C-G formula based on SCr of 1.08 mg/dL (H)). Liver Function Tests: Recent Labs  Lab 10/23/2019 0304  AST 26  ALT 15  ALKPHOS 192*  BILITOT 0.7  PROT 5.9*  ALBUMIN 2.2*   No results for input(s): LIPASE, AMYLASE in the last 168 hours. No results for input(s): AMMONIA in the last 168  hours. Coagulation Profile: Recent Labs  Lab 10/23/2019 0304  INR 1.3*   Cardiac Enzymes: No results for input(s): CKTOTAL, CKMB, CKMBINDEX, TROPONINI in the last 168 hours. BNP (last 3 results) No results for input(s): PROBNP in the last 8760 hours. HbA1C: No results for input(s): HGBA1C in the last 72 hours. CBG: No results for input(s): GLUCAP in the last 168 hours. Lipid Profile: No results for input(s): CHOL, HDL, LDLCALC, TRIG, CHOLHDL, LDLDIRECT in the last 72 hours. Thyroid Function Tests: No results for input(s): TSH, T4TOTAL, FREET4, T3FREE, THYROIDAB in the last 72 hours. Anemia Panel: No results for input(s): VITAMINB12, FOLATE, FERRITIN, TIBC, IRON, RETICCTPCT in the last 72 hours. Urine analysis: No results found for: COLORURINE, APPEARANCEUR, LABSPEC, PHURINE, GLUCOSEU, HGBUR, BILIRUBINUR, KETONESUR, PROTEINUR, UROBILINOGEN, NITRITE, LEUKOCYTESUR Sepsis Labs: @LABRCNTIP (procalcitonin:4,lacticidven:4) ) Recent Results (from the past 240 hour(s))  MRSA PCR Screening     Status: None   Collection Time: 11/12/2019  2:53 AM   Specimen: Nasal Mucosa; Nasopharyngeal  Result Value Ref Range Status   MRSA by PCR NEGATIVE NEGATIVE Final    Comment:        The GeneXpert MRSA Assay (FDA approved for NASAL specimens only), is one component of a comprehensive MRSA colonization surveillance program. It is not intended to diagnose MRSA infection nor to guide or monitor treatment for MRSA infections. Performed at [facility], [place] [address], [place], [place] [address]      Radiological Exams on Admission: DG CHEST PORT 1 VIEW  Result Date: 11/16/2019 CLINICAL DATA:  Increasing shortness of breath. EXAM: PORTABLE CHEST 1 VIEW COMPARISON:  Most recent radiograph 10/31/2019, most recent CT 11/02/2019. Recent imaging performed at [place]: Right chest tube/PleurX catheter remains in place. Unchanged pleuroparenchymal opacities throughout the right  hemithorax likely combination of residual pleural fluid and known right lung mass. No visualized pneumothorax. Left pleural effusion and associated basilar opacity slightly progressed. Improving left suprahilar opacities. Stable heart size and mediastinal contours. IMPRESSION: 1. Unchanged pleuroparenchymal opacities throughout the right hemithorax likely combination of residual pleural fluid and known right lung mass. Right chest tube/PleurX catheter remains in place. 2. Left pleural effusion and associated basilar opacity slightly progressed. Improving left suprahilar opacities. Electronically Signed   By: Keith Rake M.D.   On: 11/09/2019 03:21     Assessment/Plan Principal Problem:   Acute respiratory failure with hypoxia (HCC) Active Problems:   Pulmonary embolism (HCC)   Pleural effusion   Adenocarcinoma of right lung, stage 4 (HCC)   Acute bilateral deep vein thrombosis (DVT) of upper extremities ([place])  1. Acute respiratory failure hypoxia likely from persistent multiloculated effusion due to malignant effusion and also primary embolism and DVT of upper extremity for which patient is on Lovenox.  Patient also has a Pleurx catheter.  Please consult oncologist in the morning for further recommendation may need pulmonary critical care input. 2. Possible pneumonia and UTI on empiric antibiotics follow cultures. 3. Progressively worsening anemia with hemoptysis and Hemoccult was positive.  Follow CBC closely.  May need transfusion if hemoglobin less than 7. 4. Hypertension presently on as needed IV hydralazine and also on Lasix. 5. Acute renal failure -follow metabolic panel. 6. Metastatic adenocarcinoma of the lung being followed by oncologist.  Had received radiation with SVC during last month.  All labs are pending.  Covid test is pending.   DVT prophylaxis: Lovenox for PE. Code Status: Full code. Family Communication: Discussed with patient. Disposition Plan: To be  determined. Consults called: None. Admission status: Inpatient.   Rise Patience MD [facility] Pager [phone].  If 7PM-7AM, please contact night-coverage [email or web address] Password [email or web address]  11/08/2019, 5:08 AM

## 2019-11-04 NOTE — Plan of Care (Signed)
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  She was at the rehab center from where she was sent to [facility] with increasing shortness of breath.  Patient has had a right Pleurx catheter which was not being drained at the rehab facility.  Now she is admitted with new bilateral upper extremity DVTs and increased right pleural effusion. She is admitted with acute hypoxic respiratory failure possible pneumonia and UTI with worsening anemia heme with hemoptysis and home Hemoccult positive. Follow-up oncology recommendations. IR consulted for line placement. She has only 1 IV access. Chest x-ray -2/16 Unchanged pleuroparenchymal opacities throughout the right hemithorax likely combination of residual pleural fluid and known right lung mass. Right chest tube/PleurX catheter remains in place.  Left pleural effusion and associated basilar opacity slightly progressed. Improving left suprahilar opacities.  Labs from this morning significant for hypokalemia 3.1 creatinine 1.08 hemoglobin 7.8 white count 7.8 She is tachypneic using accessory muscles She wants to be full code ABG 7.5 0/39/71 On Lovenox for DVT

## 2019-11-04 NOTE — Progress Notes (Signed)
Pharmacy Antibiotic/Anticoagulation Note  Emily Kim is a 79 y.o. female admitted on 11/01/2019 with pneumonia.  Pharmacy has been consulted for cefepime and vancomycin dosing.  Plan: Cefepime 2 gm IV q12h (LD cefepime 1 Gm 2/15 2345 at [facility]) Vancomycin 1 Gm (LD 2/15 at 2000 at [facility]) Will check VR at 1900 today (23 hours after LD) Lovenox 90 mg sq q12h (LD 2/15 2000) F/u VR- to determine when to give next dose Goal AUC = 400-550 F/u scr/cultures/levels  Height: 5\' 8"  (172.7 cm) Weight: 194 lb 0.1 oz (88 kg) IBW/kg (Calculated) : 63.9  Temp (24hrs), Avg:98.4 F (36.9 C), Min:98.4 F (36.9 C), Max:98.4 F (36.9 C)  Recent Labs  Lab 11/02/2019 0304  WBC 7.8  CREATININE 1.08*    Estimated Creatinine Clearance: 49.8 mL/min (A) (by C-G formula based on SCr of 1.08 mg/dL (H)).    No Known Allergies  Antimicrobials this admission: 2/16 cefepime >>  2/16 vancomycin >>   Dose adjustments this admission:   Microbiology results:  BCx:   UCx:    Sputum:    MRSA PCR:   Thank you for allowing pharmacy to be a part of this patient's care.  Dorrene German 10/22/2019 5:58 AM

## 2019-11-04 NOTE — Procedures (Signed)
Interventional Radiology Procedure Note  Procedure: Right femoral non-tunneled CVC  Complications: None  Estimated Blood Loss: < 10 mL  Findings: Right femoral vein 7 Fr triple lumen Arrow CVC placed for venous access. Not candidate for arm PICC lines due to bilateral UE DVT. Also, port placement currently likely would be either unsuccessful or extremely difficult given high degree of SVC obstruction on prior CTA of chest. If future CT of chest w/ IV contrast shows improved patency of SVC and patent jugular vein in right or left neck, could potentially place port in future.  CVC tip in lower IVC. OK to use.  Venetia Night. Kathlene Cote, M.D Pager:  [phone]

## 2019-11-05 ENCOUNTER — Telehealth: Payer: Self-pay | Admitting: Pharmacist

## 2019-11-05 ENCOUNTER — Telehealth: Payer: Self-pay | Admitting: Radiation Oncology

## 2019-11-05 ENCOUNTER — Telehealth: Payer: Self-pay

## 2019-11-05 ENCOUNTER — Other Ambulatory Visit: Payer: Self-pay | Admitting: Internal Medicine

## 2019-11-05 LAB — COMPREHENSIVE METABOLIC PANEL
ALT: 16 U/L (ref 0–44)
AST: 32 U/L (ref 15–41)
Albumin: 2.1 g/dL — ABNORMAL LOW (ref 3.5–5.0)
Alkaline Phosphatase: 203 U/L — ABNORMAL HIGH (ref 38–126)
Anion gap: 8 (ref 5–15)
BUN: 17 mg/dL (ref 8–23)
CO2: 30 mmol/L (ref 22–32)
Calcium: 8.5 mg/dL — ABNORMAL LOW (ref 8.9–10.3)
Chloride: 99 mmol/L (ref 98–111)
Creatinine, Ser: 1.27 mg/dL — ABNORMAL HIGH (ref 0.44–1.00)
GFR calc Af Amer: 47 mL/min — ABNORMAL LOW (ref >60)
GFR calc non Af Amer: 40 mL/min — ABNORMAL LOW (ref >60)
Glucose, Bld: 100 mg/dL — ABNORMAL HIGH (ref 70–99)
Potassium: 3.7 mmol/L (ref 3.5–5.1)
Sodium: 137 mmol/L (ref 135–145)
Total Bilirubin: 0.9 mg/dL (ref 0.3–1.2)
Total Protein: 5.6 g/dL — ABNORMAL LOW (ref 6.5–8.1)

## 2019-11-05 LAB — PREPARE RBC (CROSSMATCH)

## 2019-11-05 LAB — HEMOGLOBIN AND HEMATOCRIT, BLOOD
HCT: 24.1 % — ABNORMAL LOW (ref 36.0–46.0)
Hemoglobin: 7.4 g/dL — ABNORMAL LOW (ref 12.0–15.0)

## 2019-11-05 LAB — CBC
HCT: 22.8 % — ABNORMAL LOW (ref 36.0–46.0)
Hemoglobin: 6.9 g/dL — CL (ref 12.0–15.0)
MCH: 25.8 pg — ABNORMAL LOW (ref 26.0–34.0)
MCHC: 30.3 g/dL (ref 30.0–36.0)
MCV: 85.4 fL (ref 80.0–100.0)
Platelets: 245 10*3/uL (ref 150–400)
RBC: 2.67 MIL/uL — ABNORMAL LOW (ref 3.87–5.11)
RDW: 19.5 % — ABNORMAL HIGH (ref 11.5–15.5)
WBC: 8.2 10*3/uL (ref 4.0–10.5)
nRBC: 0 % (ref 0.0–0.2)

## 2019-11-05 LAB — VANCOMYCIN, RANDOM: Vancomycin Rm: 24

## 2019-11-05 LAB — ABO/RH: ABO/RH(D): B POS

## 2019-11-05 LAB — MAGNESIUM: Magnesium: 1.6 mg/dL — ABNORMAL LOW (ref 1.7–2.4)

## 2019-11-05 MED ORDER — SODIUM CHLORIDE 0.9% FLUSH: 10.0000 mL | INTRAVENOUS | Status: DC | PRN

## 2019-11-05 MED ORDER — ALBUTEROL SULFATE (2.5 MG/3ML) 0.083% IN NEBU
2.5000 mg | INHALATION_SOLUTION | RESPIRATORY_TRACT | Status: DC | PRN
  Administered 2019-11-05 – 2019-11-06 (×2): 2.5 mg via RESPIRATORY_TRACT
  Filled 2019-11-05 (×3): qty 3

## 2019-11-05 MED ORDER — SODIUM CHLORIDE 0.9% FLUSH
10.0000 mL | Freq: Two times a day (BID) | INTRAVENOUS | Status: DC
  Administered 2019-11-05 – 2019-11-07 (×5): 10 mL
  Administered 2019-11-07: 10:00:00 20 mL
  Administered 2019-11-08 – 2019-11-13 (×10): 10 mL

## 2019-11-05 MED ORDER — SODIUM CHLORIDE 0.9% IV SOLUTION: Freq: Once | INTRAVENOUS | Status: DC

## 2019-11-05 MED ORDER — OSIMERTINIB MESYLATE 80 MG PO TABS
80.0000 mg | ORAL_TABLET | Freq: Every day | ORAL | Status: DC
  Administered 2019-11-05 – 2019-11-11 (×6): 80 mg via ORAL

## 2019-11-05 MED ORDER — ALBUTEROL SULFATE (2.5 MG/3ML) 0.083% IN NEBU
2.5000 mg | INHALATION_SOLUTION | Freq: Three times a day (TID) | RESPIRATORY_TRACT | Status: DC
  Administered 2019-11-05 – 2019-11-08 (×12): 2.5 mg via RESPIRATORY_TRACT
  Filled 2019-11-05 (×14): qty 3

## 2019-11-05 MED ORDER — OSIMERTINIB MESYLATE 80 MG PO TABS: 80.0000 mg | ORAL_TABLET | Freq: Every day | ORAL | 2 refills | Status: AC

## 2019-11-05 MED ORDER — PANTOPRAZOLE SODIUM 40 MG PO TBEC
40.0000 mg | DELAYED_RELEASE_TABLET | Freq: Two times a day (BID) | ORAL | Status: DC
  Administered 2019-11-05 – 2019-11-07 (×6): 40 mg via ORAL
  Filled 2019-11-05 (×7): qty 1

## 2019-11-05 MED ORDER — SODIUM CHLORIDE 0.9% IV SOLUTION: Freq: Once | INTRAVENOUS | Status: AC

## 2019-11-05 MED ORDER — LACTATED RINGERS IV BOLUS: 500.0000 mL | Freq: Once | INTRAVENOUS | Status: DC

## 2019-11-05 NOTE — Telephone Encounter (Signed)
Oral Oncology Patient Advocate Encounter  Prior Authorization for [place] [employer] has been approved.    PA# [id number] Effective dates: 11/05/19 through 09/17/20  Patients co-pay is $9.20  Oral Oncology Clinic will continue to follow.   [place] Patient [place] Phone [phone] Fax [phone] 11/05/2019 11:41 AM

## 2019-11-05 NOTE — Telephone Encounter (Signed)
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 Patient is currently admitted and the plan is to start the medication in patient.  ECG from 09/23/19 assessed, QTc 456m. Prescription dose and frequency assessed.   Current inpatient medication list in Epic reviewed, no relevant DDIs with osimertinib identified.  Oral Oncology Clinic will continue to follow for insurance authorization, copayment issues, initial counseling and start date.  ADarl Pikes PharmD, BCPS, BMethodist Stone Oak HospitalHematology/Oncology Clinical Pharmacist [facility]/AP Oral [phone] 11/05/2019 10:33 AM

## 2019-11-05 NOTE — Telephone Encounter (Signed)
Oral Oncology Patient Advocate Encounter  Received notification from [place] that prior authorization for Tagrisso is required.  PA submitted on CoverMyMeds Key [id number] Status is pending  Oral Oncology Clinic will continue to follow.  [place] Patient [place] Phone [phone] Fax [phone] 11/05/2019 10:55 AM

## 2019-11-05 NOTE — Progress Notes (Signed)
CRITICAL VALUE ALERT  Critical Value:  Hgb 6.9  Date & Time Notied:  11/05/2019 0330  Provider Notified: NP Sharlet Salina   Orders Received/Actions taken: Awaiting further orders.

## 2019-11-05 NOTE — Progress Notes (Signed)
[facility]  PROGRESS NOTE  Emily Kim SWF:[id number] DOB: 02-Nov-1940 DOA: 11/09/2019 PCP: Patient, No Pcp Per   Brief HPI:   79 year old female with stage IV non-small cell lung cancer, adenocarcinoma metastasis to bilateral lungs, lymphangitic carcinomatosis, right hilum suprahilar mass, multiple bone mets with SVC syndrome.  Patient was at rehab center and she was sent to [facility] with increasing shortness of breath.  She has had Pleurx catheter placed which was not draining well at rehab facility.  Patient was noted with new bilateral upper extremity DVTs and increased right pleural effusion.   Subjective   Patient seen and examined, denies any complaints.   Assessment/Plan:     1. Acute respiratory failure with hypoxia-likely from multiloculated effusion due to malignant effusion, pulmonary embolism with bilateral upper extremity DVT.  Patient is currently on Lovenox 90 mg twice daily. 2. ?  Pneumonia-patient chest x-ray shows pleural-parenchymal opacities throughout the right hemithorax.  Continue vancomycin, cefepime. 3. Symptomatic anemia-with hemoptysis, Hemoccult was positive.  Hemoglobin is 6.9.  Will transfuse 1 unit PRBC. 4. Hypertension-continue as needed hydralazine. 5. Metastatic adenocarcinoma of the lung-patient followed by oncology as outpatient.  Patient received bilateral radiotherapy for large suprahilar mass with SVC syndrome.    SpO2: 97 % O2 Flow Rate (L/min): 3 L/min   COVID-19 Labs  No results for input(s): DDIMER, FERRITIN, LDH, CRP in the last 72 hours.  Lab Results  Component Value Date   SARSCOV2NAA NEGATIVE 10/28/2019   [place] NEGATIVE 10/07/2019   [place] NEGATIVE 09/23/2019     CBG: No results for input(s): GLUCAP in the last 168 hours.  CBC: Recent Labs  Lab 11/07/2019 0304 11/05/19 0300  WBC 7.8 8.2  NEUTROABS 6.5  --   HGB 7.8* 6.9*  HCT 25.9* 22.8*  MCV 86.3 85.4  PLT 235 220    Basic Metabolic  Panel: Recent Labs  Lab 11/01/2019 0304 11/01/2019 2128 11/05/19 0300  NA 137  --  137  K 3.1*  --  3.7  CL 98  --  99  CO2 29  --  30  GLUCOSE 129*  --  100*  BUN 15  --  17  CREATININE 1.08*  --  1.27*  CALCIUM 8.4*  --  8.5*  MG 1.3* 1.3*  --      Liver Function Tests: Recent Labs  Lab 10/23/2019 0304 11/05/19 0300  AST 26 32  ALT 15 16  ALKPHOS 192* 203*  BILITOT 0.7 0.9  PROT 5.9* 5.6*  ALBUMIN 2.2* 2.1*        DVT prophylaxis: Lovenox  Code Status: Full code  Family Communication: No family at bedside  Disposition Plan: likely home when medically ready for discharge  Pressure Injury 11/08/2019 Sacrum Stage 2 -  Partial thickness loss of dermis presenting as a shallow open injury with a red, pink wound bed without slough. (Active)  11/07/2019 0310  Location: Sacrum  Location Orientation:   Staging: Stage 2 -  Partial thickness loss of dermis presenting as a shallow open injury with a red, pink wound bed without slough.  Wound Description (Comments):   Present on Admission: Yes     Pressure Injury 10/26/2019 Elbow Posterior;Right Stage 2 -  Partial thickness loss of dermis presenting as a shallow open injury with a red, pink wound bed without slough. (Active)  11/11/2019 0300  Location: Elbow  Location Orientation: Posterior;Right  Staging: Stage 2 -  Partial thickness loss of dermis presenting as a shallow open injury with a red,  pink wound bed without slough.  Wound Description (Comments):   Present on Admission: Yes        Scheduled medications:  . sodium chloride   Intravenous Once  . albuterol  2.5 mg Nebulization TID  . Chlorhexidine Gluconate Cloth  6 each Topical Q0600  . enoxaparin (LOVENOX) injection  90 mg Subcutaneous Q12H  . feeding supplement (ENSURE ENLIVE)  237 mL Oral BID BM  . furosemide  40 mg Oral Daily  . metoprolol tartrate  12.5 mg Oral BID  . multivitamin with minerals  1 tablet Oral Daily  . pantoprazole  40 mg Oral BID  .  senna-docusate  1 tablet Oral BID  . tamsulosin  0.4 mg Oral Daily  . traMADol  50 mg Oral Daily  . vancomycin variable dose per unstable renal function (pharmacist dosing)   Does not apply See admin instructions    Consultants:  Oncology  Procedures:    Antibiotics:   Anti-infectives (From admission, onward)   Start     Dose/Rate Route Frequency Ordered Stop   10/20/2019 1000  ceFEPIme (MAXIPIME) 2 g in sodium chloride 0.9 % 100 mL IVPB     2 g 200 mL/hr over 30 Minutes Intravenous Every 12 hours 10/31/2019 0539     10/20/2019 0623  vancomycin variable dose per unstable renal function (pharmacist dosing)      Does not apply See admin instructions 10/25/2019 0623         Objective   Vitals:   11/05/19 1030 11/05/19 1100 11/05/19 1200 11/05/19 1231  BP: (!) 114/47 (!) 106/42 (!) 122/57 (!) 126/52  Pulse: 95   97  Resp: (!) 22 (!) 23 18 (!) 24  Temp: 98.2 F (36.8 C)   98.7 F (37.1 C)  TempSrc: Oral   Oral  SpO2: 99%   97%  Weight:      Height:        Intake/Output Summary (Last 24 hours) at 11/05/2019 1251 Last data filed at 11/05/2019 1231 Gross per 24 hour  Intake 472 ml  Output 901 ml  Net -429 ml    02/15 1901 - 02/17 0700 In: 200  Out: 901 [Urine:400]  Filed Weights   10/27/2019 0449  Weight: 88 kg    Physical Examination:   General-appears in no acute distress Heart-S1-S2, regular, no murmur auscultated Lungs-clear to auscultation bilaterally, no wheezing or crackles auscultated Abdomen-soft, nontender, no organomegaly Extremities-no edema in the lower extremities Neuro-alert, oriented x3, no focal deficit noted   Data Reviewed:   Recent Results (from the past 240 hour(s))  MRSA PCR Screening     Status: None   Collection Time: 10/22/2019  2:53 AM   Specimen: Nasal Mucosa; Nasopharyngeal  Result Value Ref Range Status   MRSA by PCR NEGATIVE NEGATIVE Final    Comment:        The GeneXpert MRSA Assay (FDA approved for NASAL specimens only), is  one component of a comprehensive MRSA colonization surveillance program. It is not intended to diagnose MRSA infection nor to guide or monitor treatment for MRSA infections. Performed at [facility], [place] [address], [place], [place] [address]   SARS CORONAVIRUS 2 (TAT 6-24 HRS) Nasopharyngeal Nasopharyngeal Swab     Status: None   Collection Time: 11/03/2019  2:53 AM   Specimen: Nasopharyngeal Swab  Result Value Ref Range Status   SARS Coronavirus 2 NEGATIVE NEGATIVE Final    Comment: (NOTE) SARS-CoV-2 target nucleic acids are NOT DETECTED. The SARS-CoV-2 RNA  is generally detectable in upper and lower respiratory specimens during the acute phase of infection. Negative results do not preclude SARS-CoV-2 infection, do not rule out co-infections with other pathogens, and should not be used as the sole basis for treatment or other patient management decisions. Negative results must be combined with clinical observations, patient history, and epidemiological information. The expected result is Negative. Fact Sheet for Patients: SugarRoll.be Fact Sheet for Healthcare Providers: https://www.woods-mathews.com/ This test is not yet approved or cleared by the [place] FDA and  has been authorized for detection and/or diagnosis of SARS-CoV-2 by FDA under an Emergency Use Authorization (EUA). This EUA will remain  in effect (meaning this test can be used) for the duration of the COVID-19 declaration under Section 56 4(b)(1) of the Act, 21 U.S.C. section 360bbb-3(b)(1), unless the authorization is terminated or revoked sooner. Performed at [facility], [place] [address] [place], [place] [address]      Studies:  IR Fluoro Guide CV Line Right  Result Date: 11/02/2019 INDICATION: Metastatic stage IV adenocarcinoma of the central right lung with SVC syndrome and bilateral upper extremity DVT. The patient requires more  durable IV access during hospitalization. EXAM: NON TUNNELED CENTRAL VENOUS CATHETER PLACEMENT WITH ULTRASOUND AND FLUOROSCOPIC GUIDANCE MEDICATIONS: None ANESTHESIA/SEDATION: None FLUOROSCOPY TIME:  Fluoroscopy Time: 24 seconds.  3.7 mGy. COMPLICATIONS: None immediate. PROCEDURE: Informed written consent was obtained from the patient after a thorough discussion of the procedural risks, benefits and alternatives. All questions were addressed. Maximal Sterile Barrier Technique was utilized including caps, mask, sterile gowns, sterile gloves, sterile drape, hand hygiene and skin antiseptic. A timeout was performed prior to the initiation of the procedure. Ultrasound was used to confirm patency of the right common femoral vein. Under direct ultrasound guidance, access of the vein was performed with a 21 gauge needle and micropuncture set. Ultrasound image documentation was performed. After access of the vein, the venotomy was dilated. A 7 French, triple lumen non tunneled Arrow central venous catheter was advanced over the wire. Catheter position was confirmed by a fluoroscopic spot image. The catheter was aspirated and flushed with saline. It was secured at the exit site with Prolene retention sutures. FINDINGS: After placement of the central venous catheter, the catheter tip lies at the expected position of the confluence of the common iliac veins to form the IVC. IMPRESSION: Non tunneled central venous catheter placement via right common femoral vein. The catheter tip lies in the lower IVC. Electronically Signed   By: Aletta Edouard M.D.   On: 11/15/2019 15:56   IR US Guide Vasc Access Right  Result Date: 11/03/2019 INDICATION: Metastatic stage IV adenocarcinoma of the central right lung with SVC syndrome and bilateral upper extremity DVT. The patient requires more durable IV access during hospitalization. EXAM: NON TUNNELED CENTRAL VENOUS CATHETER PLACEMENT WITH ULTRASOUND AND FLUOROSCOPIC GUIDANCE  MEDICATIONS: None ANESTHESIA/SEDATION: None FLUOROSCOPY TIME:  Fluoroscopy Time: 24 seconds.  3.7 mGy. COMPLICATIONS: None immediate. PROCEDURE: Informed written consent was obtained from the patient after a thorough discussion of the procedural risks, benefits and alternatives. All questions were addressed. Maximal Sterile Barrier Technique was utilized including caps, mask, sterile gowns, sterile gloves, sterile drape, hand hygiene and skin antiseptic. A timeout was performed prior to the initiation of the procedure. Ultrasound was used to confirm patency of the right common femoral vein. Under direct ultrasound guidance, access of the vein was performed with a 21 gauge needle and micropuncture set. Ultrasound image documentation was performed. After access of  the vein, the venotomy was dilated. A 7 French, triple lumen non tunneled Arrow central venous catheter was advanced over the wire. Catheter position was confirmed by a fluoroscopic spot image. The catheter was aspirated and flushed with saline. It was secured at the exit site with Prolene retention sutures. FINDINGS: After placement of the central venous catheter, the catheter tip lies at the expected position of the confluence of the common iliac veins to form the IVC. IMPRESSION: Non tunneled central venous catheter placement via right common femoral vein. The catheter tip lies in the lower IVC. Electronically Signed   By: Aletta Edouard M.D.   On: 10/29/2019 15:56   DG CHEST PORT 1 VIEW  Result Date: 11/14/2019 CLINICAL DATA:  Increasing shortness of breath. EXAM: PORTABLE CHEST 1 VIEW COMPARISON:  Most recent radiograph 10/31/2019, most recent CT 11/02/2019. Recent imaging performed at [place]: Right chest tube/PleurX catheter remains in place. Unchanged pleuroparenchymal opacities throughout the right hemithorax likely combination of residual pleural fluid and known right lung mass. No visualized pneumothorax. Left pleural effusion and  associated basilar opacity slightly progressed. Improving left suprahilar opacities. Stable heart size and mediastinal contours. IMPRESSION: 1. Unchanged pleuroparenchymal opacities throughout the right hemithorax likely combination of residual pleural fluid and known right lung mass. Right chest tube/PleurX catheter remains in place. 2. Left pleural effusion and associated basilar opacity slightly progressed. Improving left suprahilar opacities. Electronically Signed   By: Keith Rake M.D.   On: 10/27/2019 03:21     Admission status: Inpatient: Based on patients clinical presentation and evaluation of above clinical data, I have made determination that patient meets Inpatient criteria at this time.   Oswald Hillock   [facility] If 7PM-7AM, please contact night-coverage at [email or web address], Office  [phone]  password [email or web address]  11/05/2019, 12:51 PM  LOS: 1 day

## 2019-11-05 NOTE — Telephone Encounter (Signed)
  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  Interval Since Last Radiation:  3 weeks   09/25/19-10/16/19: The patient received 30 Gy in 10 fractions to the right lung and 21 Gy over 6 fractions to the left femur.  Narrative:  The patient was contacted today for routine follow-up. During treatment she did very well with radiotherapy and did not have significant desquamation. She did have trouble with managing her pleural effusion and RUE swelling from her SVC syndrome. She is hospitalized following symptoms of shortness of breath and is currently at [facility] after being transferred from a facility (following her last hospitalization) to [facility]. Oncology is on board with starting systemic therapy very soon.   Impression/Plan: 1. 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 I tried to reach the patient but could not. She's currently admitted and in review of her notes is still having quite a difficult time. Fortunately recent testing has shown that her tumor is EGFR mutated and she's going to begin oral targeted therapy in the next day or two under the care of Dr. Julien Nordmann. We will be happy to see her as needed should she develop further need for palliative radiotherapy.    Carola Rhine, PAC

## 2019-11-05 NOTE — Progress Notes (Signed)
Pharmacy Antibiotic Note  Emily Kim is a 79 y.o. female admitted on 10/27/2019 with pneumonia.  Pharmacy has been consulted for cefepime and vancomycin dosing. Last vancomycin dose was 1g on 2/15 @2000  (given at [facility]).  11/05/2019  Vancomycin level drawn today at 0816 = 24 mcg/ml - drawn ~ 36 hours after last vancomycin dose.   AF, WBC WNL, SCr up to 1.27.  MRSA PCR negative  Plan: Consider stopping vancomycin with negative MRSA PCR No vancomycin needed today Will check another VR at 0500 tomorrow to determine next dose Continue cefepime 2 gm q12h F/u scr/cultures/levels  Height: 5\' 8"  (172.7 cm) Weight: 194 lb 0.1 oz (88 kg) IBW/kg (Calculated) : 63.9  Temp (24hrs), Avg:97.8 F (36.6 C), Min:97.4 F (36.3 C), Max:98.2 F (36.8 C)  Recent Labs  Lab 11/12/2019 0304 11/01/2019 1854 11/05/19 0300 11/05/19 0816  WBC 7.8  --  8.2  --   CREATININE 1.08*  --  1.27*  --   VANCORANDOM  --  28  --  24    Estimated Creatinine Clearance: 42.4 mL/min (A) (by C-G formula based on SCr of 1.27 mg/dL (H)).    No Known Allergies Antimicrobials this admission: 2/16 cefepime >>  2/16 vancomycin >>  Dose adjustments this admission: 2/16 1854 VR = 28;  ~ 24 hours after last dose 2/17 0816 VR = 24 ~ 36 hrs after last dsoe Microbiology results: 2/16 MRSA PCR: neg  2/16 Covid neg  Thank you for allowing pharmacy to be a part of this patient's care.  Eudelia Bunch, Pharm.D [phone] 11/05/2019 12:14 PM

## 2019-11-06 LAB — CBC
HCT: 23.4 % — ABNORMAL LOW (ref 36.0–46.0)
Hemoglobin: 7.2 g/dL — ABNORMAL LOW (ref 12.0–15.0)
MCH: 26.6 pg (ref 26.0–34.0)
MCHC: 30.8 g/dL (ref 30.0–36.0)
MCV: 86.3 fL (ref 80.0–100.0)
Platelets: 206 10*3/uL (ref 150–400)
RBC: 2.71 MIL/uL — ABNORMAL LOW (ref 3.87–5.11)
RDW: 19 % — ABNORMAL HIGH (ref 11.5–15.5)
WBC: 10.5 10*3/uL (ref 4.0–10.5)
nRBC: 0 % (ref 0.0–0.2)

## 2019-11-06 LAB — COMPREHENSIVE METABOLIC PANEL
ALT: 20 U/L (ref 0–44)
AST: 37 U/L (ref 15–41)
Albumin: 2 g/dL — ABNORMAL LOW (ref 3.5–5.0)
Alkaline Phosphatase: 202 U/L — ABNORMAL HIGH (ref 38–126)
Anion gap: 9 (ref 5–15)
BUN: 24 mg/dL — ABNORMAL HIGH (ref 8–23)
CO2: 29 mmol/L (ref 22–32)
Calcium: 8.3 mg/dL — ABNORMAL LOW (ref 8.9–10.3)
Chloride: 98 mmol/L (ref 98–111)
Creatinine, Ser: 1.75 mg/dL — ABNORMAL HIGH (ref 0.44–1.00)
GFR calc Af Amer: 32 mL/min — ABNORMAL LOW (ref >60)
GFR calc non Af Amer: 27 mL/min — ABNORMAL LOW (ref >60)
Glucose, Bld: 140 mg/dL — ABNORMAL HIGH (ref 70–99)
Potassium: 3.6 mmol/L (ref 3.5–5.1)
Sodium: 136 mmol/L (ref 135–145)
Total Bilirubin: 0.7 mg/dL (ref 0.3–1.2)
Total Protein: 5.7 g/dL — ABNORMAL LOW (ref 6.5–8.1)

## 2019-11-06 LAB — VANCOMYCIN, RANDOM: Vancomycin Rm: 21

## 2019-11-06 MED ORDER — MAGNESIUM SULFATE 2 GM/50ML IV SOLN
2.0000 g | Freq: Once | INTRAVENOUS | Status: AC
  Administered 2019-11-06: 14:00:00 2 g via INTRAVENOUS
  Filled 2019-11-06: qty 50

## 2019-11-06 MED ORDER — CHLORHEXIDINE GLUCONATE CLOTH 2 % EX PADS
6.0000 | MEDICATED_PAD | Freq: Every day | CUTANEOUS | Status: DC
  Administered 2019-11-06 – 2019-11-13 (×8): 6 via TOPICAL

## 2019-11-06 MED ORDER — HYDROCODONE-ACETAMINOPHEN 5-325 MG PO TABS
1.0000 | ORAL_TABLET | Freq: Four times a day (QID) | ORAL | Status: DC | PRN
  Administered 2019-11-07 (×2): 2 via ORAL
  Filled 2019-11-06 (×3): qty 2

## 2019-11-06 MED ORDER — SODIUM CHLORIDE 0.9 % IV SOLN: INTRAVENOUS | Status: AC

## 2019-11-06 MED ORDER — ORAL CARE MOUTH RINSE
15.0000 mL | Freq: Two times a day (BID) | OROMUCOSAL | Status: DC
  Administered 2019-11-06 – 2019-11-08 (×7): 15 mL via OROMUCOSAL

## 2019-11-06 NOTE — Progress Notes (Signed)
Paged Dr Eleonore Chiquito about urine output being 27ml and hip flexion precautions for therapy to work with patient with femoral line.

## 2019-11-06 NOTE — Plan of Care (Signed)
Patient resting in bed after breathing treatment, patient states that she has been trying to have BM but only able to have a very small one. Will review meds and paged MD as needed for PRNS. Will continue to  monitor  Problem: Education: Goal: Knowledge of General Education information will improve Description: Including pain rating scale, medication(s)/side effects and non-pharmacologic comfort measures Outcome: Progressing   Problem: Health Behavior/Discharge Planning: Goal: Ability to manage health-related needs will improve Outcome: Progressing   Problem: Clinical Measurements: Goal: Ability to maintain clinical measurements within normal limits will improve Outcome: Progressing Goal: Will remain free from infection Outcome: Progressing Goal: Diagnostic test results will improve Outcome: Progressing Goal: Respiratory complications will improve Outcome: Progressing Goal: Cardiovascular complication will be avoided Outcome: Progressing   Problem: Activity: Goal: Risk for activity intolerance will decrease Outcome: Progressing   Problem: Nutrition: Goal: Adequate nutrition will be maintained Outcome: Progressing   Problem: Coping: Goal: Level of anxiety will decrease Outcome: Progressing   Problem: Elimination: Goal: Will not experience complications related to bowel motility Outcome: Progressing Goal: Will not experience complications related to urinary retention Outcome: Progressing   Problem: Pain Managment: Goal: General experience of comfort will improve Outcome: Progressing   Problem: Safety: Goal: Ability to remain free from injury will improve Outcome: Progressing   Problem: Skin Integrity: Goal: Risk for impaired skin integrity will decrease Outcome: Progressing   Problem: Education: Goal: Knowledge of General Education information will improve Description: Including pain rating scale, medication(s)/side effects and non-pharmacologic comfort  measures Outcome: Progressing   Problem: Health Behavior/Discharge Planning: Goal: Ability to manage health-related needs will improve Outcome: Progressing   Problem: Clinical Measurements: Goal: Ability to maintain clinical measurements within normal limits will improve Outcome: Progressing Goal: Will remain free from infection Outcome: Progressing Goal: Diagnostic test results will improve Outcome: Progressing Goal: Respiratory complications will improve Outcome: Progressing Goal: Cardiovascular complication will be avoided Outcome: Progressing   Problem: Activity: Goal: Risk for activity intolerance will decrease Outcome: Progressing   Problem: Nutrition: Goal: Adequate nutrition will be maintained Outcome: Progressing   Problem: Coping: Goal: Level of anxiety will decrease Outcome: Progressing   Problem: Elimination: Goal: Will not experience complications related to bowel motility Outcome: Progressing Goal: Will not experience complications related to urinary retention Outcome: Progressing   Problem: Pain Managment: Goal: General experience of comfort will improve Outcome: Progressing   Problem: Safety: Goal: Ability to remain free from injury will improve Outcome: Progressing   Problem: Skin Integrity: Goal: Risk for impaired skin integrity will decrease Outcome: Progressing

## 2019-11-06 NOTE — Progress Notes (Signed)
Spoke with Dr Eleonore Chiquito about urine output and bladder scan results of 0- orders received for [place] @ 50 for 8 hours. And hip flexion precautions confirmed with him and entered under activity order.

## 2019-11-06 NOTE — Evaluation (Addendum)
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  Patient was at rehab center and she was sent to [facility] with increasing shortness of breath.  She has had Pleurx catheter placed which was not draining well at rehab facility.  Patient was noted with new bilateral upper extremity DVTs and increased right pleural effusion.  Clinical Impression  Pt admitted with above diagnosis. +2 total assist for scooting in bed. Supine to sit deferred 2* R femoral line. RN to clarify hip flexion precautions 2* femoral line with MD.  Performed BUE/LE strengthening exercises with assistance. Pt fatigues very quickly.  Pt currently with functional limitations due to the deficits listed below (see PT Problem List). Pt will benefit from skilled PT to increase their independence and safety with mobility to allow discharge to the venue listed below.       Follow Up Recommendations SNF;Supervision/Assistance - 24 hour;Supervision for mobility/OOB    Equipment Recommendations  None recommended by PT    Recommendations for Other Services   OT consult (order placed)    Precautions / Restrictions Precautions Precautions: Fall Precaution Comments: R femoral line (RN to clarify hip flexion precautions) Restrictions Weight Bearing Restrictions: No      Mobility  Bed Mobility               General bed mobility comments: deferred supine to sit 2* femoral line on R, RN to clarify hip flexion precautions with MD; +2 total assist to scoot up and to R in bed  Transfers                    Ambulation/Gait                Stairs            Wheelchair Mobility    Modified Rankin (Stroke  Patients Only)       Balance                                             Pertinent Vitals/Pain Pain Assessment: 0-10 Pain Score: 8  Pain Location: R hip with movement Pain Descriptors / Indicators: Sore Pain Intervention(s): Limited activity within patient's tolerance;Monitored during session;Repositioned    Home Living Family/patient expects to be discharged to:: Skilled nursing facility                      Prior Function Level of Independence: Needs assistance   Gait / Transfers Assistance Needed: assist for SPT at SNF  ADL's / Homemaking Assistance Needed: assist at SNF        Hand Dominance        Extremity/Trunk Assessment   Upper Extremity Assessment Upper Extremity Assessment: Defer to OT evaluation;Generalized weakness(edema BUEs, pt unable to actively elevate B shoulders without assistance)    Lower Extremity Assessment Lower Extremity Assessment: RLE deficits/detail;LLE deficits/detail RLE Deficits / Details: pain with minimal movement of R hip, quads ~2/5, ankle -3/5 RLE: Unable to fully assess due to pain RLE Sensation: WNL LLE Deficits / Details: quads +3/5, ankle +3/5, hip 2/5 LLE Sensation: WNL       Communication  Communication: No difficulties  Cognition Arousal/Alertness: Awake/alert;Lethargic Behavior During Therapy: WFL for tasks assessed/performed Overall Cognitive Status: Within Functional Limits for tasks assessed                                        General Comments      Exercises General Exercises - Upper Extremity Shoulder Flexion: AAROM;Both;10 reps;Supine General Exercises - Lower Extremity Ankle Circles/Pumps: AAROM;Both;10 reps;Supine Quad Sets: AROM;Both;5 reps;Supine Short Arc Quad: AROM;Both;Other (comment);Supine(1x) Heel Slides: AAROM;Left;10 reps;Supine   Assessment/Plan    PT Assessment Patient needs continued PT services  PT Problem List Decreased  strength;Decreased mobility;Decreased activity tolerance;Decreased balance;Pain       PT Treatment Interventions Functional mobility training;Therapeutic activities;Therapeutic exercise;Balance training;Patient/family education    PT Goals (Current goals can be found in the Care Plan section)  Acute Rehab PT Goals Patient Stated Goal: to get stronger PT Goal Formulation: With patient Time For Goal Achievement: 11/20/19 Potential to Achieve Goals: Fair    Frequency Min 2X/week   Barriers to discharge        Co-evaluation               AM-PAC PT "6 Clicks" Mobility  Outcome Measure Help needed turning from your back to your side while in a flat bed without using bedrails?: Total Help needed moving from lying on your back to sitting on the side of a flat bed without using bedrails?: Total Help needed moving to and from a bed to a chair (including a wheelchair)?: Total Help needed standing up from a chair using your arms (e.g., wheelchair or bedside chair)?: Total Help needed to walk in hospital room?: Total Help needed climbing 3-5 steps with a railing? : Total 6 Click Score: 6    End of Session   Activity Tolerance: Patient limited by fatigue;Patient limited by pain Patient left: in bed;with call bell/phone within reach;with bed alarm set Nurse Communication: Mobility status;Other (comment)(clarification needed for R hip flexion precautions 2* femoral line) PT Visit Diagnosis: Muscle weakness (generalized) (M62.81);Difficulty in walking, not elsewhere classified (R26.2)    Time: 1431-1500 PT Time Calculation (min) (ACUTE ONLY): 29 min   Charges:   PT Evaluation $PT Eval Moderate Complexity: 1 Mod PT Treatments $Therapeutic Exercise: 8-22 mins       Blondell Reveal Kistler PT 11/06/2019  Acute Rehabilitation Services Pager [phone] Office [phone]

## 2019-11-06 NOTE — Progress Notes (Signed)
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  Patient was at rehab center and she was sent to [facility] with increasing shortness of breath.  She has had Pleurx catheter placed which was not draining well at rehab facility.  Patient was noted with new bilateral upper extremity DVTs and increased right pleural effusion.   Subjective   Patient seen and examined, denies shortness of breath.   Assessment/Plan:     1. Acute respiratory failure with hypoxia-likely from multiloculated effusion due to malignant effusion, pulmonary embolism with bilateral upper extremity DVT.  Patient is currently on Lovenox 90 mg twice daily. 2. ?  Pneumonia-patient chest x-ray shows pleural-parenchymal opacities throughout the right hemithorax.  MRSA PCR is negative, vancomycin has been discontinued.  Continue cefepime. 3. Symptomatic anemia-with hemoptysis, Hemoccult was positive.  Hemoglobin 6.9, patient is s/p 1 unit PRBC.  Today hemoglobin is 7.2.  Follow CBC in a.m. 4. Acute kidney injury-renal function is worsening, today creatinine is 1.75, BUN 24.  Will hold furosemide at this time.  Follow BMP in a.m. 5. Hypertension-continue as needed hydralazine. 6. Hypomagnesemia-magnesium 1.6, will replace magnesium with 2 g mag sulfate IV x1.  Check magnesium level in a.m. 7. Metastatic adenocarcinoma of the lung-patient followed by oncology as outpatient.  Patient received bilateral radiotherapy for large suprahilar mass with SVC syndrome.    SpO2: 97 % O2 Flow Rate (L/min): 3 L/min   COVID-19 Labs  No results for input(s): DDIMER, FERRITIN, LDH, CRP in the last 72 hours.  Lab Results  Component Value Date    SARSCOV2NAA NEGATIVE 11/11/2019   [place] NEGATIVE 10/07/2019   [place] NEGATIVE 09/23/2019     CBG: No results for input(s): GLUCAP in the last 168 hours.  CBC: Recent Labs  Lab 10/30/2019 0304 11/05/19 0300 11/05/19 1431 11/06/19 0327  WBC 7.8 8.2  --  10.5  NEUTROABS 6.5  --   --   --   HGB 7.8* 6.9* 7.4* 7.2*  HCT 25.9* 22.8* 24.1* 23.4*  MCV 86.3 85.4  --  86.3  PLT 235 245  --  811    Basic Metabolic Panel: Recent Labs  Lab 11/10/2019 0304 10/30/2019 2128 11/05/19 0300 11/06/19 0327  NA 137  --  137 136  K 3.1*  --  3.7 3.6  CL 98  --  99 98  CO2 29  --  30 29  GLUCOSE 129*  --  100* 140*  BUN 15  --  17 24*  CREATININE 1.08*  --  1.27* 1.75*  CALCIUM 8.4*  --  8.5* 8.3*  MG 1.3* 1.3* 1.6*  --      Liver Function Tests: Recent Labs  Lab 10/29/2019 0304 11/05/19 0300 11/06/19 0327  AST 26 32 37  ALT 15 16 20   ALKPHOS 192* 203* 202*  BILITOT 0.7 0.9 0.7  PROT 5.9* 5.6* 5.7*  ALBUMIN 2.2* 2.1* 2.0*        DVT prophylaxis: Lovenox  Code Status: Full code  Family Communication: No family at bedside  Disposition Plan: Patient admitted with acute hypoxic respiratory failure with PE, bilateral upper extremity DVTs, SVC syndrome.  Also found to have pleural parenchymal opacities in the right lung.  Patient is currently on IV antibiotics.  She is  still requiring 3 L/min of oxygen.  Now has worsening of renal function.  Once respiratory status stabilizes and renal function improves, patient can be discharged.  Probably will require skilled nursing facility.  Will obtain PT evaluation.  Pressure Injury 10/30/2019 Sacrum Stage 2 -  Partial thickness loss of dermis presenting as a shallow open injury with a red, pink wound bed without slough. (Active)  11/07/2019 0310  Location: Sacrum  Location Orientation:   Staging: Stage 2 -  Partial thickness loss of dermis presenting as a shallow open injury with a red, pink wound bed without slough.  Wound  Description (Comments):   Present on Admission: Yes     Pressure Injury 11/07/2019 Elbow Posterior;Right Stage 2 -  Partial thickness loss of dermis presenting as a shallow open injury with a red, pink wound bed without slough. (Active)  10/26/2019 0300  Location: Elbow  Location Orientation: Posterior;Right  Staging: Stage 2 -  Partial thickness loss of dermis presenting as a shallow open injury with a red, pink wound bed without slough.  Wound Description (Comments):   Present on Admission: Yes        Scheduled medications:  . albuterol  2.5 mg Nebulization TID  . Chlorhexidine Gluconate Cloth  6 each Topical Daily  . enoxaparin (LOVENOX) injection  90 mg Subcutaneous Q12H  . feeding supplement (ENSURE ENLIVE)  237 mL Oral BID BM  . furosemide  40 mg Oral Daily  . mouth rinse  15 mL Mouth Rinse BID  . metoprolol tartrate  12.5 mg Oral BID  . multivitamin with minerals  1 tablet Oral Daily  . osimertinib mesylate  80 mg Oral Daily  . pantoprazole  40 mg Oral BID  . senna-docusate  1 tablet Oral BID  . sodium chloride flush  10-40 mL Intracatheter Q12H  . tamsulosin  0.4 mg Oral Daily  . traMADol  50 mg Oral Daily    Consultants:  Oncology  Procedures:    Antibiotics:   Anti-infectives (From admission, onward)   Start     Dose/Rate Route Frequency Ordered Stop   11/16/2019 1000  ceFEPIme (MAXIPIME) 2 g in sodium chloride 0.9 % 100 mL IVPB     2 g 200 mL/hr over 30 Minutes Intravenous Every 12 hours 11/02/2019 0539     10/30/2019 0623  vancomycin variable dose per unstable renal function (pharmacist dosing)  Status:  Discontinued      Does not apply See admin instructions 10/31/2019 0623 11/06/19 0823       Objective   Vitals:   11/06/19 0930 11/06/19 1000 11/06/19 1200 11/06/19 1300  BP:  (!) 136/49 (!) 154/46 (!) 140/46  Pulse:  96 84 88  Resp: (!) 27 20 20 20   Temp:   97.7 F (36.5 C)   TempSrc:   Oral   SpO2: 96% 96% 97% 97%  Weight:      Height:         Intake/Output Summary (Last 24 hours) at 11/06/2019 1332 Last data filed at 11/06/2019 1004 Gross per 24 hour  Intake 557.33 ml  Output 300 ml  Net 257.33 ml    02/16 1901 - 02/18 0700 In: 669.3  Out: 701 [Urine:700]  Filed Weights   10/21/2019 0449 11/06/19 0500  Weight: 88 kg 89.2 kg    Physical Examination:   General-appears in no acute distress Heart-S1-S2, regular, no murmur auscultated Lungs-clear to auscultation bilaterally, no wheezing or crackles auscultated Abdomen-soft, nontender, no organomegaly Extremities-no edema in the lower  extremities Neuro-alert, oriented x3, no focal deficit noted   Data Reviewed:   Recent Results (from the past 240 hour(s))  MRSA PCR Screening     Status: None   Collection Time: 11/09/2019  2:53 AM   Specimen: Nasal Mucosa; Nasopharyngeal  Result Value Ref Range Status   MRSA by PCR NEGATIVE NEGATIVE Final    Comment:        The GeneXpert MRSA Assay (FDA approved for NASAL specimens only), is one component of a comprehensive MRSA colonization surveillance program. It is not intended to diagnose MRSA infection nor to guide or monitor treatment for MRSA infections. Performed at [facility], [place] [address] [place], [place] [address]   SARS CORONAVIRUS 2 (TAT 6-24 HRS) Nasopharyngeal Nasopharyngeal Swab     Status: None   Collection Time: 11/12/2019  2:53 AM   Specimen: Nasopharyngeal Swab  Result Value Ref Range Status   SARS Coronavirus 2 NEGATIVE NEGATIVE Final    Comment: (NOTE) SARS-CoV-2 target nucleic acids are NOT DETECTED. The SARS-CoV-2 RNA is generally detectable in upper and lower respiratory specimens during the acute phase of infection. Negative results do not preclude SARS-CoV-2 infection, do not rule out co-infections with other pathogens, and should not be used as the sole basis for treatment or other patient management decisions. Negative results must be combined with clinical  observations, patient history, and epidemiological information. The expected result is Negative. Fact Sheet for Patients: SugarRoll.be Fact Sheet for Healthcare Providers: https://www.woods-mathews.com/ This test is not yet approved or cleared by the [place] FDA and  has been authorized for detection and/or diagnosis of SARS-CoV-2 by FDA under an Emergency Use Authorization (EUA). This EUA will remain  in effect (meaning this test can be used) for the duration of the COVID-19 declaration under Section 56 4(b)(1) of the Act, 21 U.S.C. section 360bbb-3(b)(1), unless the authorization is terminated or revoked sooner. Performed at [facility], [place] [address], [place], [place] [address]      Studies:  IR Fluoro Guide CV Line Right  Result Date: 10/24/2019 INDICATION: Metastatic stage IV adenocarcinoma of the central right lung with SVC syndrome and bilateral upper extremity DVT. The patient requires more durable IV access during hospitalization. EXAM: NON TUNNELED CENTRAL VENOUS CATHETER PLACEMENT WITH ULTRASOUND AND FLUOROSCOPIC GUIDANCE MEDICATIONS: None ANESTHESIA/SEDATION: None FLUOROSCOPY TIME:  Fluoroscopy Time: 24 seconds.  3.7 mGy. COMPLICATIONS: None immediate. PROCEDURE: Informed written consent was obtained from the patient after a thorough discussion of the procedural risks, benefits and alternatives. All questions were addressed. Maximal Sterile Barrier Technique was utilized including caps, mask, sterile gowns, sterile gloves, sterile drape, hand hygiene and skin antiseptic. A timeout was performed prior to the initiation of the procedure. Ultrasound was used to confirm patency of the right common femoral vein. Under direct ultrasound guidance, access of the vein was performed with a 21 gauge needle and micropuncture set. Ultrasound image documentation was performed. After access of the vein, the venotomy was dilated. A 7 French,  triple lumen non tunneled Arrow central venous catheter was advanced over the wire. Catheter position was confirmed by a fluoroscopic spot image. The catheter was aspirated and flushed with saline. It was secured at the exit site with Prolene retention sutures. FINDINGS: After placement of the central venous catheter, the catheter tip lies at the expected position of the confluence of the common iliac veins to form the IVC. IMPRESSION: Non tunneled central venous catheter placement via right common femoral vein. The catheter tip lies  in the lower IVC. Electronically Signed   By: Aletta Edouard M.D.   On: 11/02/2019 15:56   IR US Guide Vasc Access Right  Result Date: 11/02/2019 INDICATION: Metastatic stage IV adenocarcinoma of the central right lung with SVC syndrome and bilateral upper extremity DVT. The patient requires more durable IV access during hospitalization. EXAM: NON TUNNELED CENTRAL VENOUS CATHETER PLACEMENT WITH ULTRASOUND AND FLUOROSCOPIC GUIDANCE MEDICATIONS: None ANESTHESIA/SEDATION: None FLUOROSCOPY TIME:  Fluoroscopy Time: 24 seconds.  3.7 mGy. COMPLICATIONS: None immediate. PROCEDURE: Informed written consent was obtained from the patient after a thorough discussion of the procedural risks, benefits and alternatives. All questions were addressed. Maximal Sterile Barrier Technique was utilized including caps, mask, sterile gowns, sterile gloves, sterile drape, hand hygiene and skin antiseptic. A timeout was performed prior to the initiation of the procedure. Ultrasound was used to confirm patency of the right common femoral vein. Under direct ultrasound guidance, access of the vein was performed with a 21 gauge needle and micropuncture set. Ultrasound image documentation was performed. After access of the vein, the venotomy was dilated. A 7 French, triple lumen non tunneled Arrow central venous catheter was advanced over the wire. Catheter position was confirmed by a fluoroscopic spot image.  The catheter was aspirated and flushed with saline. It was secured at the exit site with Prolene retention sutures. FINDINGS: After placement of the central venous catheter, the catheter tip lies at the expected position of the confluence of the common iliac veins to form the IVC. IMPRESSION: Non tunneled central venous catheter placement via right common femoral vein. The catheter tip lies in the lower IVC. Electronically Signed   By: Aletta Edouard M.D.   On: 10/21/2019 15:56     Admission status: Inpatient: Based on patients clinical presentation and evaluation of above clinical data, I have made determination that patient meets Inpatient criteria at this time.   Oswald Hillock   [facility] If 7PM-7AM, please contact night-coverage at [email or web address], Office  [phone]  password [email or web address]  11/06/2019, 1:32 PM  LOS: 2 days

## 2019-11-06 NOTE — Progress Notes (Signed)
Subjective: The patient is seen and examined today.  She is feeling a little bit better with much improvement of her shortness of breath and no significant chest pain but has mild cough.  She continues to have swelling of the upper extremities secondary to deep venous thrombosis as well as SVC syndrome.  She started her treatment with Tagrisso yesterday.  Objective: Vital signs in last 24 hours: Temp:  [97.7 F (36.5 C)-98.7 F (37.1 C)] 97.7 F (36.5 C) (02/18 0810) Pulse Rate:  [86-103] 96 (02/18 1000) Resp:  [8-29] 20 (02/18 1000) BP: (80-142)/(30-64) 136/49 (02/18 1000) SpO2:  [92 %-99 %] 96 % (02/18 1000) Weight:  [196 lb 10.4 oz (89.2 kg)] 196 lb 10.4 oz (89.2 kg) (02/18 0500)  Intake/Output from previous day: 02/17 0701 - 02/18 0700 In: 469.3 [Blood:272; IV Piggyback:197.3] Out: 300 [Urine:300] Intake/Output this shift: Total I/O In: 10 [I.V.:10] Out: -   General appearance: alert, cooperative, fatigued and no distress Resp: diminished breath sounds RLL, dullness to percussion RLL and rales bilaterally Cardio: regular rate and rhythm, S1, S2 normal, no murmur, click, rub or gallop GI: soft, non-tender; bowel sounds normal; no masses,  no organomegaly Extremities: extremities normal, atraumatic, no cyanosis or edema  Lab Results:  Recent Labs    11/05/19 0300 11/05/19 0300 11/05/19 1431 11/06/19 0327  WBC 8.2  --   --  10.5  HGB 6.9*   < > 7.4* 7.2*  HCT 22.8*   < > 24.1* 23.4*  PLT 245  --   --  206   < > = values in this interval not displayed.   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 The patient requires more durable IV  access during hospitalization. EXAM: NON TUNNELED CENTRAL VENOUS CATHETER PLACEMENT WITH ULTRASOUND AND FLUOROSCOPIC GUIDANCE MEDICATIONS: None ANESTHESIA/SEDATION: None FLUOROSCOPY TIME:  Fluoroscopy Time: 24 seconds.  3.7 mGy. COMPLICATIONS: None immediate. PROCEDURE: Informed written consent was obtained from the patient after a thorough discussion of the procedural risks, benefits and alternatives. All questions were addressed. Maximal Sterile Barrier Technique was utilized including caps, mask, sterile gowns, sterile gloves, sterile drape, hand hygiene and skin antiseptic. A timeout was performed prior to the initiation of the procedure. Ultrasound was used to confirm patency of the right common femoral vein. Under direct ultrasound guidance, access of the vein was performed with a 21 gauge needle and micropuncture set. Ultrasound image documentation was performed. After access of the vein, the venotomy was dilated. A 7 French, triple lumen non tunneled Arrow central venous catheter was advanced over the wire. Catheter position was confirmed by a fluoroscopic spot image. The catheter was aspirated and flushed with saline. It was secured at the exit site with Prolene retention sutures. FINDINGS: After placement of the central venous catheter, the catheter tip lies at the expected position of the confluence of the common iliac veins to form the IVC. IMPRESSION: Non tunneled central venous catheter placement via right common femoral vein. The catheter tip lies in the lower IVC. Electronically Signed   By: Aletta Edouard M.D.   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 The patient requires more durable IV access during hospitalization. EXAM: NON TUNNELED CENTRAL VENOUS CATHETER PLACEMENT WITH ULTRASOUND AND FLUOROSCOPIC GUIDANCE MEDICATIONS: None  ANESTHESIA/SEDATION: None FLUOROSCOPY TIME:  Fluoroscopy Time: 24 seconds.  3.7 mGy. COMPLICATIONS: None immediate. PROCEDURE: Informed written consent was obtained from the patient after a thorough discussion of the procedural risks, benefits and alternatives. All questions were addressed. Maximal Sterile Barrier Technique was utilized including caps, mask, sterile gowns, sterile gloves, sterile drape, hand hygiene and skin antiseptic. A timeout was performed prior to the initiation of the procedure. Ultrasound was used to confirm patency of the right common femoral vein. Under direct ultrasound guidance, access of the vein was performed with a 21 gauge needle and micropuncture set. Ultrasound image documentation was performed. After access of the vein, the venotomy was dilated. A 7 French, triple lumen non tunneled Arrow central venous catheter was advanced over the wire. Catheter position was confirmed by a fluoroscopic spot image. The catheter was aspirated and flushed with saline. It was secured at the exit site with Prolene retention sutures. FINDINGS: After placement of the central venous catheter, the catheter tip lies at the expected position of the confluence of the common iliac veins to form the IVC. IMPRESSION: Non tunneled central venous catheter placement via right common femoral vein. The catheter tip lies in the lower IVC. Electronically Signed   By: Aletta Edouard M.D.   On: 11/10/2019 15:56    Medications: I have reviewed the patient's current medications.  Assessment/Plan: This is a very pleasant 79 years old African-American female recently diagnosed with a stage IV non-small cell lung cancer, adenocarcinoma with positive for EGFR mutation in exon 21 ([id number]).  The patient is status post palliative radiotherapy to the large right hilar mass for the SVC symptoms.  The patient started yesterday her first dose of treatment with the oral targeted agent Tagrisso.  She is feeling much better  today.  I recommended for her to continue her current treatment with Tagrisso on daily basis. For the pulmonary embolism and deep venous thrombosis, the patient will continue her current treatment with Lovenox. I will arrange for the patient a follow-up appointment with me after discharge from the hospital for close monitoring and follow-up of her treatment. Thank you so much for taking good care of Emily Kim, I will continue to follow up the patient with you and assist in her management on as-needed basis.   LOS: 2 days    Emily Kim 11/06/2019

## 2019-11-07 DIAGNOSIS — I2699 Other pulmonary embolism without acute cor pulmonale: Principal | ICD-10-CM

## 2019-11-07 LAB — COMPREHENSIVE METABOLIC PANEL
ALT: 21 U/L (ref 0–44)
AST: 38 U/L (ref 15–41)
Albumin: 2.1 g/dL — ABNORMAL LOW (ref 3.5–5.0)
Alkaline Phosphatase: 213 U/L — ABNORMAL HIGH (ref 38–126)
Anion gap: 10 (ref 5–15)
BUN: 34 mg/dL — ABNORMAL HIGH (ref 8–23)
CO2: 28 mmol/L (ref 22–32)
Calcium: 8.7 mg/dL — ABNORMAL LOW (ref 8.9–10.3)
Chloride: 97 mmol/L — ABNORMAL LOW (ref 98–111)
Creatinine, Ser: 2.44 mg/dL — ABNORMAL HIGH (ref 0.44–1.00)
GFR calc Af Amer: 21 mL/min — ABNORMAL LOW (ref >60)
GFR calc non Af Amer: 18 mL/min — ABNORMAL LOW (ref >60)
Glucose, Bld: 113 mg/dL — ABNORMAL HIGH (ref 70–99)
Potassium: 4.1 mmol/L (ref 3.5–5.1)
Sodium: 135 mmol/L (ref 135–145)
Total Bilirubin: 0.7 mg/dL (ref 0.3–1.2)
Total Protein: 6 g/dL — ABNORMAL LOW (ref 6.5–8.1)

## 2019-11-07 LAB — CBC
HCT: 24 % — ABNORMAL LOW (ref 36.0–46.0)
Hemoglobin: 7.3 g/dL — ABNORMAL LOW (ref 12.0–15.0)
MCH: 27 pg (ref 26.0–34.0)
MCHC: 30.4 g/dL (ref 30.0–36.0)
MCV: 88.9 fL (ref 80.0–100.0)
Platelets: 203 10*3/uL (ref 150–400)
RBC: 2.7 MIL/uL — ABNORMAL LOW (ref 3.87–5.11)
RDW: 19.3 % — ABNORMAL HIGH (ref 11.5–15.5)
WBC: 8.8 10*3/uL (ref 4.0–10.5)
nRBC: 0 % (ref 0.0–0.2)

## 2019-11-07 LAB — MAGNESIUM: Magnesium: 1.9 mg/dL (ref 1.7–2.4)

## 2019-11-07 MED ORDER — SODIUM CHLORIDE 0.9 % IV SOLN: INTRAVENOUS | Status: DC

## 2019-11-07 MED ORDER — ENOXAPARIN SODIUM 100 MG/ML ~~LOC~~ SOLN
90.0000 mg | SUBCUTANEOUS | Status: DC
  Administered 2019-11-08 – 2019-11-09 (×2): 90 mg via SUBCUTANEOUS
  Filled 2019-11-07 (×2): qty 0.9

## 2019-11-07 NOTE — Progress Notes (Signed)
Pharmacy Antibiotic Note  Emily Kim is a 79 y.o. female admitted on 11/07/2019 with pneumonia.  Pharmacy was consulted for cefepime and vancomycin dosing (both started at [facility] on 2/15). Last vancomycin dose was 1g on 2/15 @2000  (given at [facility]).  11/07/2019   Plan: Adjust Cefepime fro 2gm q12 to q24 for worsening renal fx.   Height: 5\' 8"  (172.7 cm) Weight: 196 lb 10.4 oz (89.2 kg) IBW/kg (Calculated) : 63.9  Temp (24hrs), Avg:97.7 F (36.5 C), Min:97.5 F (36.4 C), Max:97.9 F (36.6 C)  Recent Labs  Lab 11/02/2019 0304 11/02/2019 1854 11/05/19 0300 11/05/19 0816 11/06/19 0327 11/06/19 0328 11/07/19 0500  WBC 7.8  --  8.2  --  10.5  --  8.8  CREATININE 1.08*  --  1.27*  --  1.75*  --  2.44*  VANCORANDOM  --    < >  --  24  --  21  --    < > = values in this interval not displayed.    Estimated Creatinine Clearance: 22.2 mL/min (A) (by C-G formula based on SCr of 2.44 mg/dL (H)).    No Known Allergies Antimicrobials this admission: 2/16 cefepime >>  2/16 vancomycin >> 2/18 Dose adjustments this admission: 2/16 1854 VR = 28;  ~ 24 hours after last dose 2/17 0816 VR = 24 ~ 36 hrs after last dose 2/18 0328 VR = 21 - d/w Dr Darrick Meigs DC vanc 2/19 Cefepime 2gm q12 > q24 Microbiology results: 2/16 MRSA PCR: neg  2/16 Covid neg  Thank you for allowing pharmacy to be a part of this patient's care.  Minda Ditto PharmD 11/07/2019, 1:25 PM

## 2019-11-07 NOTE — Progress Notes (Addendum)
[facility] for Lovenox Indication:  PE, bil UE DVTs  No Known Allergies  Patient Measurements: Height: 5\' 8"  (172.7 cm) Weight: 196 lb 10.4 oz (89.2 kg) IBW/kg (Calculated) : 63.9 Heparin Dosing Weight: 89.2 kg  Vital Signs: Temp: 97.5 F (36.4 C) (02/19 1201) Temp Source: Oral (02/19 1201) BP: 114/39 (02/19 1200) Pulse Rate: 81 (02/19 1200)  Labs: Recent Labs    11/05/19 0300 11/05/19 0300 11/05/19 1431 11/05/19 1431 11/06/19 0327 11/07/19 0500  HGB 6.9*   < > 7.4*   < > 7.2* 7.3*  HCT 22.8*   < > 24.1*  --  23.4* 24.0*  PLT 245  --   --   --  206 203  CREATININE 1.27*  --   --   --  1.75* 2.44*   < > = values in this interval not displayed.    Estimated Creatinine Clearance: 22.2 mL/min (A) (by C-G formula based on SCr of 2.44 mg/dL (H)).   Medical History: Past Medical History:  Diagnosis Date  . Uterine cancer (HCC)    Medications:  Scheduled:  . albuterol  2.5 mg Nebulization TID  . Chlorhexidine Gluconate Cloth  6 each Topical Daily  . enoxaparin (LOVENOX) injection  90 mg Subcutaneous Q12H  . feeding supplement (ENSURE ENLIVE)  237 mL Oral BID BM  . mouth rinse  15 mL Mouth Rinse BID  . metoprolol tartrate  12.5 mg Oral BID  . multivitamin with minerals  1 tablet Oral Daily  . osimertinib mesylate  80 mg Oral Daily  . pantoprazole  40 mg Oral BID  . senna-docusate  1 tablet Oral BID  . sodium chloride flush  10-40 mL Intracatheter Q12H  . tamsulosin  0.4 mg Oral Daily  . traMADol  50 mg Oral Daily    Assessment: 40 yoF [facility] (SNF[facility]Rehab[facility] with ShOB. Continued Lovenox 90mg  SQ q12 (started at [facility]) for PE, bil UE DVTs, PleurX   Goal of Therapy:  Monitor platelets by anticoagulation protocol: Yes   Plan:  Clearance < 30 ml/min Reduce Lovenox 90mg  SQ from q12 > q24 Monitor s/s bleed  Follow CBC  Loreena Valeri L 11/07/2019,1:29 PM

## 2019-11-07 NOTE — Evaluation (Signed)
Occupational Therapy Evaluation Patient Details Name: Emily Kim MRN: [id number] DOB: 1941/02/01 Today's Date: 11/07/2019    History of Present Illness 79 year old female with stage IV non-small cell lung cancer, adenocarcinoma metastasis to bilateral lungs, lymphangitic carcinomatosis, right hilum suprahilar mass, multiple bone mets with SVC syndrome.  Patient was at rehab center and she was sent to [facility] with increasing shortness of breath.  She has had Pleurx catheter placed which was not draining well at rehab facility.  Patient was noted with new bilateral upper extremity DVTs and increased right pleural effusion.   Clinical Impression   Pt was admitted for the above. She reports that prior to early January, she was independent and lived alone. She has had assistance with adls at rehab facility.  Pt presents with bil UE DVTs. She has edema, decreased strength, and R intention tremor. Did not mobilize OOB today. Will follow in acute setting with the goals listed below.     Follow Up Recommendations  SNF    Equipment Recommendations  (defer to next venue)    Recommendations for Other Services       Precautions / Restrictions Precautions Precautions: Fall Precaution Comments: R femoral line:  do not exceed 90 flexion while in place (under activities order) Restrictions Weight Bearing Restrictions: No      Mobility Bed Mobility                  Transfers                      Balance                                           ADL either performed or assessed with clinical judgement   ADL Overall ADL's : Needs assistance/impaired Eating:  Min A for beverage only; max for food (which she currently is not taking) Eating/Feeding Details (indicate cue type and reason): only taking ensure. Provided weighted lidded mug; Min A to retrieve and replace mug. Encouraged use of bil UEs Grooming: Wash/dry face;Minimal assistance for face--same  for hands; max/total for teeth and hair Grooming Details (indicate cue type and reason): using LUE Upper Body Bathing: Maximal assistance   Lower Body Bathing: Total assistance;+2 for physical assistance   Upper Body Dressing : Maximal assistance   Lower Body Dressing: Total assistance;+2 for physical assistance                 General ADL Comments: Pt seen at bed level. Encouraged AROM distally, especially hands for edema management and positioned up at end of session. Performed 5 rep each AAROM for FF.  Pt requested to stop after this     Vision         Perception     Praxis      Pertinent Vitals/Pain Pain Assessment: No/denies pain(from bed level)     Hand Dominance Right   Extremity/Trunk Assessment Upper Extremity Assessment Upper Extremity Assessment: RUE deficits/detail;LUE deficits/detail RUE Deficits / Details: has intention tremor.  Pt is R dominant. Able to move hand, wrist and fingers on her own. Can lift up against gravity, approximately 20 degrees. Bil UEs with DVTs LUE Deficits / Details: No intention tremor.  LUE more edema; cannot make a full fist.  able to move wrist and elbow.  Able to lift arm approximately 20 degrees against gravity  Communication Communication Communication: HOH   Cognition Arousal/Alertness: Awake/alert Behavior During Therapy: WFL for tasks assessed/performed Overall Cognitive Status: Difficult to assess                                 General Comments: followed commands; PLOF provided was different that what was reported to PT   General Comments  RR up to 36    Exercises     Shoulder Instructions      Home Living Family/patient expects to be discharged to:: Skilled nursing facility Living Arrangements: Alone                               Additional Comments: pt was at snf for rehab. She reports that prior to early January, she was independent and used a cane occasionally       Prior Functioning/Environment          Comments: PLOF pt reported was different that what she told PT.  Pt is HOH--mask did not hellp        OT Problem List: Decreased strength;Decreased range of motion;Decreased activity tolerance;Decreased knowledge of use of DME or AE;Increased edema;Cardiopulmonary status limiting activity(balance NT)      OT Treatment/Interventions: Self-care/ADL training;DME and/or AE instruction;Energy conservation;Therapeutic exercise;Patient/family education;Therapeutic activities    OT Goals(Current goals can be found in the care plan section) Acute Rehab OT Goals Patient Stated Goal: to get stronger OT Goal Formulation: With patient Time For Goal Achievement: 11/21/19 Potential to Achieve Goals: Good ADL Goals Pt Will Perform Eating: with adaptive utensils;sitting;bed level;with min assist Pt Will Perform Grooming: with min assist;bed level;sitting(x 3 tasks) Pt Will Perform Upper Body Bathing: with min assist;with adaptive equipment;bed level;sitting Pt Will Perform Upper Body Dressing: with mod assist;bed level;sitting Additional ADL Goal #1: pt will roll with mod A for adls, using bedrails Additional ADL Goal #2: pt will sit eob >90 degree angle with unilateral support during light adl strengthening activity x 5 min with min A once at EOB  OT Frequency: Min 2X/week   Barriers to D/C:            Co-evaluation              AM-PAC OT "6 Clicks" Daily Activity     Outcome Measure Help from another person eating meals?: A Lot Help from another person taking care of personal grooming?: A Lot Help from another person toileting, which includes using toliet, bedpan, or urinal?: Total Help from another person bathing (including washing, rinsing, drying)?: Total Help from another person to put on and taking off regular upper body clothing?: A Lot Help from another person to put on and taking off regular lower body clothing?: Total 6 Click  Score: 9   End of Session    Activity Tolerance: Patient limited by fatigue Patient left: in bed;with call bell/phone within reach;with bed alarm set;with nursing/sitter in room  OT Visit Diagnosis: Muscle weakness (generalized) (M62.81)                Time: 8938-1017 OT Time Calculation (min): 25 min Charges:  OT General Charges $OT Visit: 1 Visit OT Evaluation $OT Eval Low Complexity: 1 Low OT Treatments $Self Care/Home Management : 8-22 mins  Hani Campusano S, OTR/L Acute Rehabilitation Services 11/07/2019  [place] 11/07/2019, 10:52 AM

## 2019-11-07 NOTE — Plan of Care (Signed)
  Problem: Education: Goal: Knowledge of General Education information will improve Description: Including pain rating scale, medication(s)/side effects and non-pharmacologic comfort measures Outcome: Progressing   Problem: Clinical Measurements: Goal: Will remain free from infection Outcome: Progressing Goal: Respiratory complications will improve Outcome: Progressing   Problem: Nutrition: Goal: Adequate nutrition will be maintained Outcome: Progressing   Problem: Coping: Goal: Level of anxiety will decrease Outcome: Progressing   Problem: Pain Managment: Goal: General experience of comfort will improve Outcome: Progressing

## 2019-11-07 NOTE — Progress Notes (Signed)
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  Patient was at rehab center and she was sent to [facility] with increasing shortness of breath.  She has had Pleurx catheter placed which was not draining well at rehab facility.  Patient was noted with new bilateral upper extremity DVTs and increased right pleural effusion.   Subjective   Patient seen and examined, denies any complaints.  Creatinine has been slowly rising.  She was on Lasix which is currently on hold.  Received IV normal saline at 50 mill per hour for 8 hours.   Assessment/Plan:     1. Acute respiratory failure with hypoxia-likely from multiloculated effusion due to malignant effusion, pulmonary embolism with bilateral upper extremity DVT.  Patient is currently on Lovenox 90 mg twice daily. 2. ?  Pneumonia-patient chest x-ray shows pleural-parenchymal opacities throughout the right hemithorax.  MRSA PCR is negative, vancomycin has been discontinued.  Continue cefepime. 3. Symptomatic anemia-with hemoptysis, Hemoccult was positive.  Hemoglobin 6.9, patient is s/p 1 unit PRBC.  Today hemoglobin is 7.3.  Follow CBC in a.m. 4. Acute kidney injury-renal function is worsening, today creatinine is 2.44, BUN 24.  She was on furosemide which is currently on hold.  She did receive normal saline at 50 mL/h for 8 hours.  Will again start normal saline at 75 mL/h.  Follow BMP in a.m. 5. Hypertension-continue as needed hydralazine. 6. Hypomagnesemia-magnesium was 1.6, patient received magnesium with 2 g mag sulfate IV x1.  Repeat magnesium level today is 1.9. 7. Metastatic adenocarcinoma of the lung-patient followed by oncology as outpatient.  Patient  received bilateral radiotherapy for large suprahilar mass with SVC syndrome.    SpO2: 94 % O2 Flow Rate (L/min): 2 L/min   COVID-19 Labs  No results for input(s): DDIMER, FERRITIN, LDH, CRP in the last 72 hours.  Lab Results  Component Value Date   SARSCOV2NAA NEGATIVE 10/26/2019   [place] NEGATIVE 10/07/2019   [place] NEGATIVE 09/23/2019     CBG: No results for input(s): GLUCAP in the last 168 hours.  CBC: Recent Labs  Lab 11/03/2019 0304 11/05/19 0300 11/05/19 1431 11/06/19 0327 11/07/19 0500  WBC 7.8 8.2  --  10.5 8.8  NEUTROABS 6.5  --   --   --   --   HGB 7.8* 6.9* 7.4* 7.2* 7.3*  HCT 25.9* 22.8* 24.1* 23.4* 24.0*  MCV 86.3 85.4  --  86.3 88.9  PLT 235 245  --  206 595    Basic Metabolic Panel: Recent Labs  Lab 11/09/2019 0304 11/03/2019 2128 11/05/19 0300 11/06/19 0327 11/07/19 0500  NA 137  --  137 136 135  K 3.1*  --  3.7 3.6 4.1  CL 98  --  99 98 97*  CO2 29  --  30 29 28   GLUCOSE 129*  --  100* 140* 113*  BUN 15  --  17 24* 34*  CREATININE 1.08*  --  1.27* 1.75* 2.44*  CALCIUM 8.4*  --  8.5* 8.3* 8.7*  MG 1.3* 1.3* 1.6*  --  1.9     Liver Function Tests: Recent Labs  Lab 11/02/2019 0304 11/05/19 0300 11/06/19 0327 11/07/19 0500  AST 26 32 37 38  ALT 15 16 20 21   ALKPHOS 192*  203* 202* 213*  BILITOT 0.7 0.9 0.7 0.7  PROT 5.9* 5.6* 5.7* 6.0*  ALBUMIN 2.2* 2.1* 2.0* 2.1*        DVT prophylaxis: Lovenox  Code Status: Full code  Family Communication: No family at bedside  Disposition Plan: Patient admitted with acute hypoxic respiratory failure with PE, bilateral upper extremity DVTs, SVC syndrome.  Also found to have pleural parenchymal opacities in the right lung.  Patient is currently on IV antibiotics.  She is still requiring 3 L/min of oxygen.  Now has worsening of renal function.  Once respiratory status stabilizes and renal function improves, patient can be discharged.  Probably will require skilled nursing facility.     Pressure Injury 11/05/2019 Sacrum Stage 2 -  Partial thickness loss of dermis presenting as a shallow open injury with a red, pink wound bed without slough. (Active)  11/11/2019 0310  Location: Sacrum  Location Orientation:   Staging: Stage 2 -  Partial thickness loss of dermis presenting as a shallow open injury with a red, pink wound bed without slough.  Wound Description (Comments):   Present on Admission: Yes     Pressure Injury 10/27/2019 Elbow Posterior;Right Stage 2 -  Partial thickness loss of dermis presenting as a shallow open injury with a red, pink wound bed without slough. (Active)  11/09/2019 0300  Location: Elbow  Location Orientation: Posterior;Right  Staging: Stage 2 -  Partial thickness loss of dermis presenting as a shallow open injury with a red, pink wound bed without slough.  Wound Description (Comments):   Present on Admission: Yes        Scheduled medications:  . albuterol  2.5 mg Nebulization TID  . Chlorhexidine Gluconate Cloth  6 each Topical Daily  . enoxaparin (LOVENOX) injection  90 mg Subcutaneous Q12H  . feeding supplement (ENSURE ENLIVE)  237 mL Oral BID BM  . mouth rinse  15 mL Mouth Rinse BID  . metoprolol tartrate  12.5 mg Oral BID  . multivitamin with minerals  1 tablet Oral Daily  . osimertinib mesylate  80 mg Oral Daily  . pantoprazole  40 mg Oral BID  . senna-docusate  1 tablet Oral BID  . sodium chloride flush  10-40 mL Intracatheter Q12H  . tamsulosin  0.4 mg Oral Daily  . traMADol  50 mg Oral Daily    Consultants:  Oncology  Procedures:    Antibiotics:   Anti-infectives (From admission, onward)   Start     Dose/Rate Route Frequency Ordered Stop   11/14/2019 1000  ceFEPIme (MAXIPIME) 2 g in sodium chloride 0.9 % 100 mL IVPB     2 g 200 mL/hr over 30 Minutes Intravenous Every 12 hours 11/03/2019 0539     11/09/2019 0623  vancomycin variable dose per unstable renal function (pharmacist dosing)  Status:  Discontinued      Does not  apply See admin instructions 10/31/2019 0623 11/06/19 0823       Objective   Vitals:   11/07/19 0500 11/07/19 0809 11/07/19 0903 11/07/19 0927  BP: (!) 156/81   (!) 176/57  Pulse: 93   97  Resp: (!) 22     Temp:  97.8 F (36.6 C)    TempSrc:  Oral    SpO2: 94%  94%   Weight:      Height:        Intake/Output Summary (Last 24 hours) at 11/07/2019 1058 Last data filed at 11/07/2019 0950 Gross per 24 hour  Intake 615.56 ml  Output 425 ml  Net 190.56 ml    02/17 1901 - 02/19 0700 In: 1152.9 [P.O.:650; I.V.:10] Out: 725 [Urine:725]  Filed Weights   11/07/2019 0449 11/06/19 0500  Weight: 88 kg 89.2 kg    Physical Examination:   General-appears in no acute distress Heart-S1-S2, regular, no murmur auscultated Lungs-clear to auscultation bilaterally, no wheezing or crackles auscultated Abdomen-soft, nontender, no organomegaly Extremities-no edema in the lower extremities Neuro-alert, oriented x3, no focal deficit noted  Data Reviewed:   Recent Results (from the past 240 hour(s))  MRSA PCR Screening     Status: None   Collection Time: 10/20/2019  2:53 AM   Specimen: Nasal Mucosa; Nasopharyngeal  Result Value Ref Range Status   MRSA by PCR NEGATIVE NEGATIVE Final    Comment:        The GeneXpert MRSA Assay (FDA approved for NASAL specimens only), is one component of a comprehensive MRSA colonization surveillance program. It is not intended to diagnose MRSA infection nor to guide or monitor treatment for MRSA infections. Performed at [facility], [place] [address], [place], [place] [address]   SARS CORONAVIRUS 2 (TAT 6-24 HRS) Nasopharyngeal Nasopharyngeal Swab     Status: None   Collection Time: 11/03/2019  2:53 AM   Specimen: Nasopharyngeal Swab  Result Value Ref Range Status   SARS Coronavirus 2 NEGATIVE NEGATIVE Final    Comment: (NOTE) SARS-CoV-2 target nucleic acids are NOT DETECTED. The SARS-CoV-2 RNA is generally detectable in upper and  lower respiratory specimens during the acute phase of infection. Negative results do not preclude SARS-CoV-2 infection, do not rule out co-infections with other pathogens, and should not be used as the sole basis for treatment or other patient management decisions. Negative results must be combined with clinical observations, patient history, and epidemiological information. The expected result is Negative. Fact Sheet for Patients: SugarRoll.be Fact Sheet for Healthcare Providers: https://www.woods-mathews.com/ This test is not yet approved or cleared by the [place] FDA and  has been authorized for detection and/or diagnosis of SARS-CoV-2 by FDA under an Emergency Use Authorization (EUA). This EUA will remain  in effect (meaning this test can be used) for the duration of the COVID-19 declaration under Section 56 4(b)(1) of the Act, 21 U.S.C. section 360bbb-3(b)(1), unless the authorization is terminated or revoked sooner. Performed at [facility], [place] [address] [place], [place] [address]      Studies:  No results found.   Admission status: Inpatient: Based on patients clinical presentation and evaluation of above clinical data, I have made determination that patient meets Inpatient criteria at this time.   Oswald Hillock   [facility] If 7PM-7AM, please contact night-coverage at [email or web address], Office  [phone]  password [email or web address]  11/07/2019, 10:58 AM  LOS: 3 days

## 2019-11-08 ENCOUNTER — Inpatient Hospital Stay (HOSPITAL_COMMUNITY): Payer: Medicare Other

## 2019-11-08 LAB — COMPREHENSIVE METABOLIC PANEL
ALT: 22 U/L (ref 0–44)
AST: 34 U/L (ref 15–41)
Albumin: 2.2 g/dL — ABNORMAL LOW (ref 3.5–5.0)
Alkaline Phosphatase: 221 U/L — ABNORMAL HIGH (ref 38–126)
Anion gap: 9 (ref 5–15)
BUN: 44 mg/dL — ABNORMAL HIGH (ref 8–23)
CO2: 27 mmol/L (ref 22–32)
Calcium: 8.8 mg/dL — ABNORMAL LOW (ref 8.9–10.3)
Chloride: 98 mmol/L (ref 98–111)
Creatinine, Ser: 3.04 mg/dL — ABNORMAL HIGH (ref 0.44–1.00)
GFR calc Af Amer: 16 mL/min — ABNORMAL LOW (ref >60)
GFR calc non Af Amer: 14 mL/min — ABNORMAL LOW (ref >60)
Glucose, Bld: 131 mg/dL — ABNORMAL HIGH (ref 70–99)
Potassium: 4.8 mmol/L (ref 3.5–5.1)
Sodium: 134 mmol/L — ABNORMAL LOW (ref 135–145)
Total Bilirubin: 0.4 mg/dL (ref 0.3–1.2)
Total Protein: 6.2 g/dL — ABNORMAL LOW (ref 6.5–8.1)

## 2019-11-08 LAB — CBC
HCT: 25.4 % — ABNORMAL LOW (ref 36.0–46.0)
Hemoglobin: 7.3 g/dL — ABNORMAL LOW (ref 12.0–15.0)
MCH: 26.3 pg (ref 26.0–34.0)
MCHC: 28.7 g/dL — ABNORMAL LOW (ref 30.0–36.0)
MCV: 91.4 fL (ref 80.0–100.0)
Platelets: 208 10*3/uL (ref 150–400)
RBC: 2.78 MIL/uL — ABNORMAL LOW (ref 3.87–5.11)
RDW: 19.7 % — ABNORMAL HIGH (ref 11.5–15.5)
WBC: 7.9 10*3/uL (ref 4.0–10.5)
nRBC: 0 % (ref 0.0–0.2)

## 2019-11-08 MED ORDER — SODIUM CHLORIDE 0.9 % IV SOLN
2.0000 g | INTRAVENOUS | Status: AC
  Administered 2019-11-08 – 2019-11-10 (×3): 2 g via INTRAVENOUS
  Filled 2019-11-08 (×3): qty 2

## 2019-11-08 MED ORDER — SODIUM CHLORIDE 0.9 % IV BOLUS
500.0000 mL | Freq: Once | INTRAVENOUS | Status: AC
  Administered 2019-11-08: 02:00:00 500 mL via INTRAVENOUS

## 2019-11-08 MED ORDER — ALBUMIN HUMAN 25 % IV SOLN
INTRAVENOUS | Status: AC
  Administered 2019-11-08: 16:00:00 25 g via INTRAVENOUS
  Filled 2019-11-08: qty 50

## 2019-11-08 MED ORDER — ALBUMIN HUMAN 25 % IV SOLN
25.0000 g | Freq: Once | INTRAVENOUS | Status: AC
  Filled 2019-11-08: qty 50

## 2019-11-08 MED ORDER — SODIUM CHLORIDE 0.9% IV SOLUTION: Freq: Once | INTRAVENOUS | Status: DC

## 2019-11-08 NOTE — Consult Note (Signed)
[facility]  HISTORY AND PHYSICAL  Emily Kim is an 79 y.o. female.    Chief Complaint: transfer from [place] for bilateral DVT and PE  HPI: Pt is a 23F with advanced (Stage IV) non-small cell lung Ca, SVC syndrome, and bilateral upper extremity DVTs who is now seen in consultation at the request of Dr. Darrick Meigs for evaluation and recommendations surrounding AKI. Pt was initially transferred here from [place] on 11/02/2019 for further management of bilateral upper extremity DVT, loculated R pleural effusion, and lung Ca.  She was previously on Eliquis and is now on lovenox for DVT.  On cefepime for loculated pleural effusion, possibly infected.  Has been started on Tagrisso 2/24for her lung cancer.  Volume status has beeen difficult to assess in this pt.  She has received both fluids and Lasix. Cr trend as follows: Cr 1.06 2/16 --> 1.27 2/17 --> 1.75 2/18 --> 2.44 2/19 --> 3.04 2/20.  She has made very little urine today- I see only about 50-100 mL in the bag.    She is sleepy and responds somewhat to my voice, but nursing reports she's been more confused throughout the day.  Blood pressures have been obtained on leg- difficult to see if they are accurate.  Weights are overall up.  Got 2 scans at OSH--> last one on 2/14, unclear if it had contrast.    PMH: Past Medical History:  Diagnosis Date  . Uterine cancer (HCC)    PSH: Past Surgical History:  Procedure Laterality Date  . ABDOMINAL HYSTERECTOMY    . IR FLUORO GUIDE CV LINE RIGHT  11/05/2019  . IR US GUIDE VASC ACCESS RIGHT  11/01/2019  . VIDEO BRONCHOSCOPY WITH ENDOBRONCHIAL ULTRASOUND N/A 09/25/2019   Procedure: VIDEO BRONCHOSCOPY WITH ENDOBRONCHIAL ULTRASOUND;  Surgeon: Melrose Nakayama, MD;  Location: [place];  Service: Thoracic;  Laterality: N/A;    Past Medical History:  Diagnosis Date  . Uterine cancer (HCC)     Medications:   Scheduled: . albuterol  2.5 mg Nebulization TID  . Chlorhexidine Gluconate  Cloth  6 each Topical Daily  . enoxaparin (LOVENOX) injection  90 mg Subcutaneous Q24H  . feeding supplement (ENSURE ENLIVE)  237 mL Oral BID BM  . mouth rinse  15 mL Mouth Rinse BID  . metoprolol tartrate  12.5 mg Oral BID  . multivitamin with minerals  1 tablet Oral Daily  . osimertinib mesylate  80 mg Oral Daily  . pantoprazole  40 mg Oral BID  . senna-docusate  1 tablet Oral BID  . sodium chloride flush  10-40 mL Intracatheter Q12H  . tamsulosin  0.4 mg Oral Daily  . traMADol  50 mg Oral Daily    Medications Prior to Admission  Medication Sig Dispense Refill  . acetaminophen (TYLENOL) 325 MG tablet Take 650 mg by mouth every 6 (six) hours as needed for mild pain.    Marland Kitchen albuterol (PROVENTIL) (2.5 MG/3ML) 0.083% nebulizer solution Take 2.5 mg by nebulization every 6 (six) hours.    Marland Kitchen ceFEPIme 1 g in sodium chloride 0.9 % 100 mL Inject 1 g into the vein every 6 (six) hours. Infuse over 30 mins    . enoxaparin (LOVENOX) 100 MG/ML injection Inject 90 mg into the skin every 12 (twelve) hours.    . furosemide (LASIX) 10 MG/ML solution Take 40 mg by mouth daily.    Marland Kitchen guaiFENesin-dextromethorphan (ROBITUSSIN DM) 100-10 MG/5ML syrup Take 10 mLs by mouth every 4 (four) hours as needed  for cough. 118 mL 0  . hydrALAZINE (APRESOLINE) 20 MG/ML injection Inject 10 mg into the vein every 4 (four) hours as needed (SBP>165 or DBP> 110).    . metoprolol tartrate (LOPRESSOR) 25 MG tablet Take 12.5 mg by mouth 2 (two) times daily.    . Multiple Vitamin (MULTIVITAMIN WITH MINERALS) TABS tablet Take 1 tablet by mouth daily.    . ondansetron (ZOFRAN-ODT) 4 MG disintegrating tablet Take 4 mg by mouth every 8 (eight) hours as needed for nausea or vomiting.    . pantoprazole (PROTONIX) 40 MG injection Inject 40 mg into the vein 2 (two) times daily.    Marland Kitchen senna-docusate (SENOKOT-S) 8.6-50 MG tablet Take 1 tablet by mouth 2 (two) times daily.    . tamsulosin (FLOMAX) 0.4 MG CAPS capsule Take 1 capsule (0.4 mg  total) by mouth daily. 30 capsule   . traMADol (ULTRAM) 50 MG tablet Take 50 mg by mouth daily.    . Vancomycin HCl 1000 MG/200ML SOLN Inject 1,000 mg into the vein every 18 (eighteen) hours. Infuse over 60 minutes    . apixaban (ELIQUIS) 5 MG TABS tablet Take 1 tablet (5 mg total) by mouth 2 (two) times daily. (Patient not taking: Reported on 11/06/2019) 60 tablet   . feeding supplement, ENSURE ENLIVE, (ENSURE ENLIVE) LIQD Take 237 mLs by mouth 2 (two) times daily between meals. 237 mL 12  . furosemide (LASIX) 20 MG tablet Take 1 tablet (20 mg total) by mouth daily. (Patient not taking: Reported on 11/16/2019) 30 tablet   . ipratropium-albuterol (DUONEB) 0.5-2.5 (3) MG/3ML SOLN Take 3 mLs by nebulization every 4 (four) hours as needed. (Patient not taking: Reported on 10/25/2019) 360 mL   . sodium chloride 1 g tablet Take 2 tablets (2 g total) by mouth 2 (two) times daily with a meal. (Patient not taking: Reported on 11/16/2019)      ALLERGIES:  No Known Allergies  FAM HX: Family History  Problem Relation Age of Onset  . Cancer Neg Hx     Social History:   reports that she quit smoking about 30 years ago. She has never used smokeless tobacco. She reports previous alcohol use. She reports that she does not use drugs.  ROS: ROS: not able to be obtained d/t AMS// confusion  Blood pressure (!) 161/51, pulse 100, temperature (!) 97.4 F (36.3 C), temperature source Oral, resp. rate (!) 23, height 5\' 8"  (1.727 m), weight 94.9 kg, SpO2 100 %. PHYSICAL EXAM: Physical Exam  GEN: lying in bed, confused HEENT eyes closed, periorbital edema NECK thick neck PULM R muffled breath sounds CV tachycardic no m/r/g ABD soft, somewhat distended, NABS EXT no LE edema, 3++ bilateral upper extremity edema.  + palpable radial pulses NEURO confused, doesn't respond really to questions    Results for orders placed or performed during the hospital encounter of 10/25/2019 (from the past 48 hour(s))  CBC      Status: Abnormal   Collection Time: 11/07/19  5:00 AM  Result Value Ref Range   WBC 8.8 4.0 - 10.5 K/uL   RBC 2.70 (L) 3.87 - 5.11 MIL/uL   Hemoglobin 7.3 (L) 12.0 - 15.0 g/dL   HCT 24.0 (L) 36.0 - 46.0 %   MCV 88.9 80.0 - 100.0 fL   MCH 27.0 26.0 - 34.0 pg   MCHC 30.4 30.0 - 36.0 g/dL   RDW 19.3 (H) 11.5 - 15.5 %   Platelets 203 150 - 400 K/uL   nRBC 0.0 0.0 -  0.2 %    Comment: Performed at [facility], [place] [address], [place], [place] [address]  Comprehensive metabolic panel     Status: Abnormal   Collection Time: 11/07/19  5:00 AM  Result Value Ref Range   Sodium 135 135 - 145 mmol/L   Potassium 4.1 3.5 - 5.1 mmol/L   Chloride 97 (L) 98 - 111 mmol/L   CO2 28 22 - 32 mmol/L   Glucose, Bld 113 (H) 70 - 99 mg/dL   BUN 34 (H) 8 - 23 mg/dL   Creatinine, Ser 2.44 (H) 0.44 - 1.00 mg/dL   Calcium 8.7 (L) 8.9 - 10.3 mg/dL   Total Protein 6.0 (L) 6.5 - 8.1 g/dL   Albumin 2.1 (L) 3.5 - 5.0 g/dL   AST 38 15 - 41 U/L   ALT 21 0 - 44 U/L   Alkaline Phosphatase 213 (H) 38 - 126 U/L   Total Bilirubin 0.7 0.3 - 1.2 mg/dL   GFR calc non Af Amer 18 (L) >60 mL/min   GFR calc Af Amer 21 (L) >60 mL/min   Anion gap 10 5 - 15    Comment: Performed at [facility], [place] [address] [place], [place] [address]  Magnesium     Status: None   Collection Time: 11/07/19  5:00 AM  Result Value Ref Range   Magnesium 1.9 1.7 - 2.4 mg/dL    Comment: Performed at [facility], [place] [address], [place], [place] [address]  CBC     Status: Abnormal   Collection Time: 11/08/19  5:00 AM  Result Value Ref Range   WBC 7.9 4.0 - 10.5 K/uL   RBC 2.78 (L) 3.87 - 5.11 MIL/uL   Hemoglobin 7.3 (L) 12.0 - 15.0 g/dL   HCT 25.4 (L) 36.0 - 46.0 %   MCV 91.4 80.0 - 100.0 fL   MCH 26.3 26.0 - 34.0 pg   MCHC 28.7 (L) 30.0 - 36.0 g/dL   RDW 19.7 (H) 11.5 - 15.5 %   Platelets 208 150 - 400 K/uL   nRBC 0.0 0.0 - 0.2 %    Comment: Performed at [facility], [place] [address], [place], [place] [address]  Comprehensive metabolic panel     Status: Abnormal   Collection Time: 11/08/19  5:00 AM  Result Value Ref Range   Sodium 134 (L) 135 - 145 mmol/L   Potassium 4.8 3.5 - 5.1 mmol/L   Chloride 98 98 - 111 mmol/L   CO2 27 22 - 32 mmol/L   Glucose, Bld 131 (H) 70 - 99 mg/dL   BUN 44 (H) 8 - 23 mg/dL   Creatinine, Ser 3.04 (H) 0.44 - 1.00 mg/dL   Calcium 8.8 (L) 8.9 - 10.3 mg/dL   Total Protein 6.2 (L) 6.5 - 8.1 g/dL   Albumin 2.2 (L) 3.5 - 5.0 g/dL   AST 34 15 - 41 U/L   ALT 22 0 - 44 U/L   Alkaline Phosphatase 221 (H) 38 - 126 U/L   Total Bilirubin 0.4 0.3 - 1.2 mg/dL   GFR calc non Af Amer 14 (L) >60 mL/min   GFR calc Af Amer 16 (L) >60 mL/min   Anion gap 9 5 - 15    Comment: Performed at [facility], [place] [address] [place], [place] [address]    US RENAL  Result Date: 11/08/2019 CLINICAL DATA:  Poor urine output EXAM: RENAL / URINARY TRACT ULTRASOUND COMPLETE COMPARISON:  CT 09/26/2019 FINDINGS: Right Kidney: Renal measurements: 11.6 x 4.9 x 5.2 cm = volume: 153 mL. Increased cortical echogenicity. No mass or hydronephrosis visualized. 2.7 cm cyst over the mid pole. Left Kidney: Renal measurements: 11.9 x 4.5 x 5.9 cm = volume: 165 mL. Cortical echogenicity is normal. No mass or hydronephrosis visualized. Bladder: Appears normal for degree of bladder distention. Other: None. IMPRESSION: 1. Normal size kidneys without hydronephrosis. Mild increased right renal cortical echogenicity which can be seen with medical renal disease. 2.  2.7 cm right renal cyst. Electronically Signed   By: Marin Olp M.D.   On: 11/08/2019 11:41   DG Chest Port 1V same Day  Result Date: 11/08/2019 CLINICAL DATA:  Acute respiratory failure with hypoxia. Stage IV right lung cancer. EXAM: PORTABLE CHEST 1 VIEW COMPARISON:  11/15/2019 and 212 FINDINGS: Right-sided PleurX catheter unchanged. Interval worsening opacification over the right mid  to upper lung likely pleural fluid and atelectasis. Stable elevated right hemidiaphragm. Stable hazy opacification over the left upper lobe. Cardiomediastinal silhouette and remainder of the exam is unchanged. IMPRESSION: 1. Volume loss of the right lung with stable elevation right hemidiaphragm. Worsening opacification over the right mid to upper lung likely pleural fluid and atelectasis. Right PleurX catheter unchanged. 2.  Stable hazy opacification over the left upper lobe. Electronically Signed   By: Marin Olp M.D.   On: 11/08/2019 11:19    Assessment/Plan  1.  AKI on CKD: Unclear precipitant.  Renal US negative, no infarcts.  Holding Lasix.  Unsure if BP is accurate- could be hypotensive causing AKI.  Can't get BP on the arm.  Tagrisso and other TKIs can be associated with nephrotoxicity esp when Cr at a non-steady state.  Can cause AKI, proteinuria, TMA.  Will send UA and UP/C.  Definitively biopsy would help [place] decipher but with pt's MSOF I don't think that would be advisable in this situation.  Would consider discussing with oncology whether or not to continue this med.  Pt not making a whole lot of urine today and has a Foley.  May try a small albumin bolus, consider another unit of blood but will defer to primary.   2.  Stage IV Adenocarcinoma of the lung: Tagrisso started 2/18, had received palliative radiation with R perihilar mass  3.  Bilateral DVTs and PE: on Lovenox--> may need to adjust for AKI   4.  R loculated pleural effusion/ ? pneumonia: on cefepime, Pleurx got some drainage out today per nursing.  Looks like vanc was ordered but never received.  5.  Hypertension: on low-dose metoprolol, would try to avoid the prn hydralazine given ? Blood pressures  6.  Anemia: was having hemoptysis, transfuse prn  7.  Dispo: it appears her prognosis is poor.  She is not a dialysis candidate given her advanced cancer.  Consider palliative consultation.     Madelon Lips 11/08/2019,  1:29 PM

## 2019-11-08 NOTE — Progress Notes (Signed)
Patient did not void this shift.  Bedside RN bladder scanned patient 3X's.  No urine appeared on any scan.

## 2019-11-08 NOTE — Progress Notes (Signed)
[facility]  PROGRESS NOTE  Emily Kim NFA:[id number] DOB: November 13, 1940 DOA: 10/25/2019 PCP: Patient, No Pcp Per   Brief HPI:   79 year old female with stage IV non-small cell lung cancer, adenocarcinoma metastasis to bilateral lungs, lymphangitic carcinomatosis, right hilum suprahilar mass, multiple bone mets with SVC syndrome.  Patient was at rehab center and she was sent to [facility] with increasing shortness of breath.  She has had Pleurx catheter placed which was not draining well at rehab facility.  Patient was noted with new bilateral upper extremity DVTs and increased right pleural effusion.   Subjective   Patient seen and examined, is more lethargic this morning.  Renal function is worsening, not making urine.  Bladder scan showed no urine in the bladder.  She was started on IV normal saline at 75 mill per hour yesterday.  She does have bilateral pleural effusions and has right Pleurx catheter in place.   Assessment/Plan:     1. Acute respiratory failure with hypoxia-likely from multiloculated effusion due to malignant effusion, pulmonary embolism with bilateral upper extremity DVT.  Patient is currently on Lovenox 90 mg twice daily.  Will repeat chest x-ray this morning, try to drain pleural fluid from right Pleurx catheter. 2. ?  Pneumonia-patient chest x-ray shows pleural-parenchymal opacities throughout the right hemithorax.  MRSA PCR is negative, vancomycin has been discontinued.  Continue cefepime. 3. Symptomatic anemia-with hemoptysis, Hemoccult was positive.  Hemoglobin 6.9, patient is s/p 1 unit PRBC.  Today hemoglobin is 7.3.  Follow CBC in a.m. 4. Acute kidney injury-renal function is worsening, today creatinine is 3.04, BUN 44.   She was on furosemide which is currently on hold.  She did receive normal saline at 50 mL/h for 8 hours.  Then started on normal saline 75 mill per hour yesterday.  Renal function continues to worsen.  She has no urine output.  I have called  nephrology consultation with Dr. Hollie Salk.  She will see patient today.   5. Hypertension-continue as needed hydralazine. 6. Hypomagnesemia-magnesium was 1.6, patient received magnesium with 2 g mag sulfate IV x1.  Repeat magnesium level today is 1.9. 7. Metastatic adenocarcinoma of the lung-patient followed by oncology as outpatient.  Patient received bilateral radiotherapy for large suprahilar mass with SVC syndrome.    SpO2: 95 % O2 Flow Rate (L/min): 2 L/min   COVID-19 Labs  No results for input(s): DDIMER, FERRITIN, LDH, CRP in the last 72 hours.  Lab Results  Component Value Date   SARSCOV2NAA NEGATIVE 10/24/2019   [place] NEGATIVE 10/07/2019   [place] NEGATIVE 09/23/2019     CBG: No results for input(s): GLUCAP in the last 168 hours.  CBC: Recent Labs  Lab 10/26/2019 0304 10/21/2019 0304 11/05/19 0300 11/05/19 1431 11/06/19 0327 11/07/19 0500 11/08/19 0500  WBC 7.8  --  8.2  --  10.5 8.8 7.9  NEUTROABS 6.5  --   --   --   --   --   --   HGB 7.8*   < > 6.9* 7.4* 7.2* 7.3* 7.3*  HCT 25.9*   < > 22.8* 24.1* 23.4* 24.0* 25.4*  MCV 86.3  --  85.4  --  86.3 88.9 91.4  PLT 235  --  245  --  206 203 208   < > = values in this interval not displayed.    Basic Metabolic Panel: Recent Labs  Lab 11/09/2019 0304 10/30/2019 2128 11/05/19 0300 11/06/19 0327 11/07/19 0500 11/08/19 0500  NA 137  --  137 136 135  134*  K 3.1*  --  3.7 3.6 4.1 4.8  CL 98  --  99 98 97* 98  CO2 29  --  30 29 28 27   GLUCOSE 129*  --  100* 140* 113* 131*  BUN 15  --  17 24* 34* 44*  CREATININE 1.08*  --  1.27* 1.75* 2.44* 3.04*  CALCIUM 8.4*  --  8.5* 8.3* 8.7* 8.8*  MG 1.3* 1.3* 1.6*  --  1.9  --      Liver Function Tests: Recent Labs  Lab 10/30/2019 0304 11/05/19 0300 11/06/19 0327 11/07/19 0500 11/08/19 0500  AST 26 32 37 38 34  ALT 15 16 20 21 22   ALKPHOS 192* 203* 202* 213* 221*  BILITOT 0.7 0.9 0.7 0.7 0.4  PROT 5.9* 5.6* 5.7* 6.0* 6.2*  ALBUMIN 2.2* 2.1* 2.0* 2.1* 2.2*         DVT prophylaxis: Lovenox  Code Status: Full code  Family Communication: No family at bedside  Disposition Plan: Patient admitted with acute hypoxic respiratory failure with PE, bilateral upper extremity DVTs, SVC syndrome.  Also found to have pleural parenchymal opacities in the right lung.  Patient is currently on IV antibiotics.  She is still requiring 3 L/min of oxygen.  Now has worsening of renal function.  Once respiratory status stabilizes and renal function improves, patient can be discharged.  Probably will require skilled nursing facility.    Pressure Injury 11/10/2019 Sacrum Stage 2 -  Partial thickness loss of dermis presenting as a shallow open injury with a red, pink wound bed without slough. (Active)  10/31/2019 0310  Location: Sacrum  Location Orientation:   Staging: Stage 2 -  Partial thickness loss of dermis presenting as a shallow open injury with a red, pink wound bed without slough.  Wound Description (Comments):   Present on Admission: Yes     Pressure Injury 11/11/2019 Elbow Posterior;Right Stage 2 -  Partial thickness loss of dermis presenting as a shallow open injury with a red, pink wound bed without slough. (Active)  11/12/2019 0300  Location: Elbow  Location Orientation: Posterior;Right  Staging: Stage 2 -  Partial thickness loss of dermis presenting as a shallow open injury with a red, pink wound bed without slough.  Wound Description (Comments):   Present on Admission: Yes        Scheduled medications:  . albuterol  2.5 mg Nebulization TID  . Chlorhexidine Gluconate Cloth  6 each Topical Daily  . enoxaparin (LOVENOX) injection  90 mg Subcutaneous Q24H  . feeding supplement (ENSURE ENLIVE)  237 mL Oral BID BM  . mouth rinse  15 mL Mouth Rinse BID  . metoprolol tartrate  12.5 mg Oral BID  . multivitamin with minerals  1 tablet Oral Daily  . osimertinib mesylate  80 mg Oral Daily  . pantoprazole  40 mg Oral BID  . senna-docusate  1 tablet Oral  BID  . sodium chloride flush  10-40 mL Intracatheter Q12H  . tamsulosin  0.4 mg Oral Daily  . traMADol  50 mg Oral Daily    Consultants:  Oncology  Procedures:    Antibiotics:   Anti-infectives (From admission, onward)   Start     Dose/Rate Route Frequency Ordered Stop   11/08/19 2200  ceFEPIme (MAXIPIME) 2 g in sodium chloride 0.9 % 100 mL IVPB     2 g 200 mL/hr over 30 Minutes Intravenous Every 24 hours 11/08/19 0712     11/11/2019 1000  ceFEPIme (MAXIPIME) 2  g in sodium chloride 0.9 % 100 mL IVPB  Status:  Discontinued     2 g 200 mL/hr over 30 Minutes Intravenous Every 12 hours 11/11/2019 0539 11/08/19 0712   11/06/2019 0623  vancomycin variable dose per unstable renal function (pharmacist dosing)  Status:  Discontinued      Does not apply See admin instructions 11/02/2019 0623 11/06/19 0823       Objective   Vitals:   11/08/19 0800 11/08/19 0801 11/08/19 0804 11/08/19 0900  BP: (!) 141/60   (!) 163/51  Pulse: 96  96 96  Resp: (!) 29  (!) 24 (!) 31  Temp:  (!) 97.4 F (36.3 C)    TempSrc:  Oral    SpO2: 94%  94% 95%  Weight:      Height:        Intake/Output Summary (Last 24 hours) at 11/08/2019 5188 Last data filed at 11/08/2019 0800 Gross per 24 hour  Intake 1727.06 ml  Output 125 ml  Net 1602.06 ml    02/18 1901 - 02/20 0700 In: 1977.6 [P.O.:480; I.V.:1122.5] Out: 475 [Urine:475]  Filed Weights   10/20/2019 0449 11/06/19 0500 11/08/19 0600  Weight: 88 kg 89.2 kg 94.9 kg    Physical Examination:  General-appears lethargic Heart-S1-S2, regular, no murmur auscultated Lungs-decreased breath sounds bilaterally Abdomen-soft, nontender, no organomegaly Extremities-no edema in the lower extremities Neuro-alert, confused  Data Reviewed:   Recent Results (from the past 240 hour(s))  MRSA PCR Screening     Status: None   Collection Time: 11/11/2019  2:53 AM   Specimen: Nasal Mucosa; Nasopharyngeal  Result Value Ref Range Status   MRSA by PCR NEGATIVE  NEGATIVE Final    Comment:        The GeneXpert MRSA Assay (FDA approved for NASAL specimens only), is one component of a comprehensive MRSA colonization surveillance program. It is not intended to diagnose MRSA infection nor to guide or monitor treatment for MRSA infections. Performed at [facility], [place] [address] [place], [place] [address]   SARS CORONAVIRUS 2 (TAT 6-24 HRS) Nasopharyngeal Nasopharyngeal Swab     Status: None   Collection Time: 11/07/2019  2:53 AM   Specimen: Nasopharyngeal Swab  Result Value Ref Range Status   SARS Coronavirus 2 NEGATIVE NEGATIVE Final    Comment: (NOTE) SARS-CoV-2 target nucleic acids are NOT DETECTED. The SARS-CoV-2 RNA is generally detectable in upper and lower respiratory specimens during the acute phase of infection. Negative results do not preclude SARS-CoV-2 infection, do not rule out co-infections with other pathogens, and should not be used as the sole basis for treatment or other patient management decisions. Negative results must be combined with clinical observations, patient history, and epidemiological information. The expected result is Negative. Fact Sheet for Patients: SugarRoll.be Fact Sheet for Healthcare Providers: https://www.woods-mathews.com/ This test is not yet approved or cleared by the [place] FDA and  has been authorized for detection and/or diagnosis of SARS-CoV-2 by FDA under an Emergency Use Authorization (EUA). This EUA will remain  in effect (meaning this test can be used) for the duration of the COVID-19 declaration under Section 56 4(b)(1) of the Act, 21 U.S.C. section 360bbb-3(b)(1), unless the authorization is terminated or revoked sooner. Performed at [facility], [place] [address] [place], [place] [address]      Studies:  No results found.   Admission status: Inpatient: Based on patients clinical presentation and evaluation  of above clinical data, I have made determination that  patient meets Inpatient criteria at this time.   Oswald Hillock   [facility] If 7PM-7AM, please contact night-coverage at [email or web address], Office  [phone]  password [email or web address]  11/08/2019, 9:53 AM  LOS: 4 days

## 2019-11-09 ENCOUNTER — Inpatient Hospital Stay (HOSPITAL_COMMUNITY): Payer: Medicare Other

## 2019-11-09 ENCOUNTER — Inpatient Hospital Stay (HOSPITAL_COMMUNITY): Payer: Medicare Other | Admitting: Certified Registered Nurse Anesthetist

## 2019-11-09 DIAGNOSIS — J9622 Acute and chronic respiratory failure with hypercapnia: Secondary | ICD-10-CM

## 2019-11-09 DIAGNOSIS — R092 Respiratory arrest: Secondary | ICD-10-CM

## 2019-11-09 DIAGNOSIS — I469 Cardiac arrest, cause unspecified: Secondary | ICD-10-CM

## 2019-11-09 DIAGNOSIS — J9621 Acute and chronic respiratory failure with hypoxia: Secondary | ICD-10-CM

## 2019-11-09 LAB — URINALYSIS, ROUTINE W REFLEX MICROSCOPIC
Bacteria, UA: NONE SEEN
Bilirubin Urine: NEGATIVE
Glucose, UA: NEGATIVE mg/dL
Ketones, ur: 20 mg/dL — AB
Nitrite: NEGATIVE
Protein, ur: 100 mg/dL — AB
Specific Gravity, Urine: 1.025 (ref 1.005–1.030)
WBC, UA: >50 WBC/hpf — ABNORMAL HIGH (ref 0–5)
pH: 5 (ref 5.0–8.0)

## 2019-11-09 LAB — GLUCOSE, CAPILLARY
Glucose-Capillary: 124 mg/dL — ABNORMAL HIGH (ref 70–99)
Glucose-Capillary: 137 mg/dL — ABNORMAL HIGH (ref 70–99)
Glucose-Capillary: 139 mg/dL — ABNORMAL HIGH (ref 70–99)
Glucose-Capillary: 171 mg/dL — ABNORMAL HIGH (ref 70–99)
Glucose-Capillary: 185 mg/dL — ABNORMAL HIGH (ref 70–99)

## 2019-11-09 LAB — CBC
HCT: 23.9 % — ABNORMAL LOW (ref 36.0–46.0)
Hemoglobin: 7 g/dL — ABNORMAL LOW (ref 12.0–15.0)
MCH: 26.1 pg (ref 26.0–34.0)
MCHC: 29.3 g/dL — ABNORMAL LOW (ref 30.0–36.0)
MCV: 89.2 fL (ref 80.0–100.0)
Platelets: 194 10*3/uL (ref 150–400)
RBC: 2.68 MIL/uL — ABNORMAL LOW (ref 3.87–5.11)
RDW: 19.8 % — ABNORMAL HIGH (ref 11.5–15.5)
WBC: 9.1 10*3/uL (ref 4.0–10.5)
nRBC: 0.2 % (ref 0.0–0.2)

## 2019-11-09 LAB — BLOOD GAS, ARTERIAL
Acid-base deficit: 2.4 mmol/L — ABNORMAL HIGH (ref 0.0–2.0)
Bicarbonate: 25 mmol/L (ref 20.0–28.0)
FIO2: 100
O2 Saturation: 93.1 %
Patient temperature: 97
pCO2 arterial: 59.4 mmHg — ABNORMAL HIGH (ref 32.0–48.0)
pH, Arterial: 7.241 — ABNORMAL LOW (ref 7.350–7.450)
pO2, Arterial: 68.3 mmHg — ABNORMAL LOW (ref 83.0–108.0)

## 2019-11-09 LAB — PROTEIN / CREATININE RATIO, URINE
Creatinine, Urine: 110.27 mg/dL
Protein Creatinine Ratio: 3.15 mg/mg{Cre} — ABNORMAL HIGH (ref 0.00–0.15)
Total Protein, Urine: 347 mg/dL

## 2019-11-09 LAB — BASIC METABOLIC PANEL
Anion gap: 14 (ref 5–15)
BUN: 51 mg/dL — ABNORMAL HIGH (ref 8–23)
CO2: 22 mmol/L (ref 22–32)
Calcium: 9.3 mg/dL (ref 8.9–10.3)
Chloride: 99 mmol/L (ref 98–111)
Creatinine, Ser: 3.81 mg/dL — ABNORMAL HIGH (ref 0.44–1.00)
GFR calc Af Amer: 12 mL/min — ABNORMAL LOW (ref >60)
GFR calc non Af Amer: 11 mL/min — ABNORMAL LOW (ref >60)
Glucose, Bld: 140 mg/dL — ABNORMAL HIGH (ref 70–99)
Potassium: 4.9 mmol/L (ref 3.5–5.1)
Sodium: 135 mmol/L (ref 135–145)

## 2019-11-09 LAB — PHOSPHORUS
Phosphorus: 3.7 mg/dL (ref 2.5–4.6)
Phosphorus: 1.9 mg/dL — ABNORMAL LOW (ref 2.5–4.6)

## 2019-11-09 LAB — MAGNESIUM
Magnesium: 2.1 mg/dL (ref 1.7–2.4)
Magnesium: 2 mg/dL (ref 1.7–2.4)

## 2019-11-09 LAB — HEPARIN LEVEL (UNFRACTIONATED): Heparin Unfractionated: 2.02 IU/mL — ABNORMAL HIGH (ref 0.30–0.70)

## 2019-11-09 MED ORDER — NOREPINEPHRINE 4 MG/250ML-% IV SOLN
0.0000 ug/min | INTRAVENOUS | Status: DC
  Administered 2019-11-09: 03:00:00 2 ug/min via INTRAVENOUS
  Administered 2019-11-09: 21:00:00 8 ug/min via INTRAVENOUS
  Administered 2019-11-09: 11:00:00 5 ug/min via INTRAVENOUS
  Administered 2019-11-10: 06:00:00 8 ug/min via INTRAVENOUS
  Administered 2019-11-10: 6 ug/min via INTRAVENOUS
  Administered 2019-11-11: 23:00:00 5 ug/min via INTRAVENOUS
  Administered 2019-11-11: 13:00:00 6 ug/min via INTRAVENOUS
  Administered 2019-11-12: 14:00:00 4 ug/min via INTRAVENOUS
  Administered 2019-11-13: 11:00:00 2 ug/min via INTRAVENOUS
  Filled 2019-11-09 (×9): qty 250

## 2019-11-09 MED ORDER — PROPOFOL 1000 MG/100ML IV EMUL
0.0000 ug/kg/min | INTRAVENOUS | Status: DC
  Administered 2019-11-09 (×2): 5 ug/kg/min via INTRAVENOUS
  Administered 2019-11-10: 22:00:00 10 ug/kg/min via INTRAVENOUS
  Administered 2019-11-10: 11:00:00 5 ug/kg/min via INTRAVENOUS
  Administered 2019-11-11 (×2): 15 ug/kg/min via INTRAVENOUS
  Administered 2019-11-12: 19:00:00 14 ug/kg/min via INTRAVENOUS
  Administered 2019-11-12: 06:00:00 15 ug/kg/min via INTRAVENOUS
  Administered 2019-11-13 (×2): 30 ug/kg/min via INTRAVENOUS
  Filled 2019-11-09 (×11): qty 100

## 2019-11-09 MED ORDER — PROPOFOL 1000 MG/100ML IV EMUL
0.0000 ug/kg/min | INTRAVENOUS | Status: DC
  Filled 2019-11-09: qty 100

## 2019-11-09 MED ORDER — ACETAMINOPHEN 650 MG RE SUPP: 650.0000 mg | Freq: Four times a day (QID) | RECTAL | Status: DC | PRN

## 2019-11-09 MED ORDER — FENTANYL BOLUS VIA INFUSION
25.0000 ug | INTRAVENOUS | Status: DC | PRN
  Administered 2019-11-11 – 2019-11-13 (×8): 25 ug via INTRAVENOUS
  Filled 2019-11-09: qty 25

## 2019-11-09 MED ORDER — FENTANYL CITRATE (PF) 100 MCG/2ML IJ SOLN: 25.0000 ug | Freq: Once | INTRAMUSCULAR | Status: DC

## 2019-11-09 MED ORDER — FENTANYL BOLUS VIA INFUSION
25.0000 ug | INTRAVENOUS | Status: DC | PRN
  Filled 2019-11-09: qty 25

## 2019-11-09 MED ORDER — MIDAZOLAM HCL 2 MG/2ML IJ SOLN: 1.0000 mg | INTRAMUSCULAR | Status: DC | PRN

## 2019-11-09 MED ORDER — ORAL CARE MOUTH RINSE
15.0000 mL | OROMUCOSAL | Status: DC
  Administered 2019-11-09 – 2019-11-13 (×42): 15 mL via OROMUCOSAL

## 2019-11-09 MED ORDER — ADULT MULTIVITAMIN W/MINERALS CH
1.0000 | ORAL_TABLET | Freq: Every day | ORAL | Status: DC
  Administered 2019-11-09 – 2019-11-13 (×5): 1
  Filled 2019-11-09 (×5): qty 1

## 2019-11-09 MED ORDER — CHLORHEXIDINE GLUCONATE 0.12% ORAL RINSE (MEDLINE KIT)
15.0000 mL | Freq: Two times a day (BID) | OROMUCOSAL | Status: DC
  Administered 2019-11-09 – 2019-11-13 (×9): 15 mL via OROMUCOSAL

## 2019-11-09 MED ORDER — FENTANYL CITRATE (PF) 100 MCG/2ML IJ SOLN: 25.0000 ug | INTRAMUSCULAR | Status: DC | PRN

## 2019-11-09 MED ORDER — SENNOSIDES-DOCUSATE SODIUM 8.6-50 MG PO TABS: 1.0000 | ORAL_TABLET | Freq: Every evening | ORAL | Status: DC | PRN

## 2019-11-09 MED ORDER — PRO-STAT SUGAR FREE PO LIQD
30.0000 mL | Freq: Two times a day (BID) | ORAL | Status: DC
  Administered 2019-11-09 – 2019-11-13 (×9): 30 mL
  Filled 2019-11-09 (×9): qty 30

## 2019-11-09 MED ORDER — BISACODYL 10 MG RE SUPP: 10.0000 mg | Freq: Every day | RECTAL | Status: DC | PRN

## 2019-11-09 MED ORDER — FENTANYL CITRATE (PF) 100 MCG/2ML IJ SOLN
25.0000 ug | Freq: Once | INTRAMUSCULAR | Status: AC
  Administered 2019-11-09: 06:00:00 25 ug via INTRAVENOUS

## 2019-11-09 MED ORDER — ACETAMINOPHEN 325 MG PO TABS: 650.0000 mg | ORAL_TABLET | Freq: Four times a day (QID) | ORAL | Status: DC | PRN

## 2019-11-09 MED ORDER — PANTOPRAZOLE SODIUM 40 MG PO PACK
40.0000 mg | PACK | Freq: Every day | ORAL | Status: DC
  Administered 2019-11-09 – 2019-11-13 (×5): 40 mg
  Filled 2019-11-09 (×5): qty 20

## 2019-11-09 MED ORDER — FENTANYL 2500MCG IN NS 250ML (10MCG/ML) PREMIX INFUSION
25.0000 ug/h | INTRAVENOUS | Status: DC
  Filled 2019-11-09: qty 250

## 2019-11-09 MED ORDER — FENTANYL 2500MCG IN NS 250ML (10MCG/ML) PREMIX INFUSION
25.0000 ug/h | INTRAVENOUS | Status: DC
  Administered 2019-11-09: 06:00:00 25 ug/h via INTRAVENOUS
  Administered 2019-11-09: 11:00:00 50 ug/h via INTRAVENOUS
  Administered 2019-11-10: 23:00:00 75 ug/h via INTRAVENOUS
  Administered 2019-11-11: 100 ug/h via INTRAVENOUS
  Administered 2019-11-12: 150 ug/h via INTRAVENOUS
  Administered 2019-11-13: 12:00:00 200 ug/h via INTRAVENOUS
  Filled 2019-11-09 (×4): qty 250

## 2019-11-09 MED ORDER — VITAL HIGH PROTEIN PO LIQD
1000.0000 mL | ORAL | Status: DC
  Administered 2019-11-09: 09:00:00 1000 mL

## 2019-11-09 NOTE — Anesthesia Procedure Notes (Signed)
Procedure Name: Intubation Date/Time: 11/09/2019 2:48 AM Performed by: Montel Clock, CRNA Pre-anesthesia Checklist: Patient identified, Emergency Drugs available, Suction available, Patient being monitored and Timeout performed Patient Re-evaluated:Patient Re-evaluated prior to induction Oxygen Delivery Method: Ambu bag Preoxygenation: Pre-oxygenation with 100% oxygen Induction Type: IV induction Ventilation: Mask ventilation without difficulty Laryngoscope Size: Mac and 3 Grade View: Grade II Tube type: Oral Tube size: 7.5 mm Number of attempts: 1 Airway Equipment and Method: Stylet Placement Confirmation: ETT inserted through vocal cords under direct vision,  breath sounds checked- equal and bilateral and CO2 detector Secured at: 23 cm Tube secured with: Tape Dental Injury: Teeth and Oropharynx as per pre-operative assessment

## 2019-11-09 NOTE — Consult Note (Signed)
NAME:  Emily Kim, MRN:  [id number], DOB:  Oct 10, 1940, LOS: 5 ADMISSION DATE:  11/03/2019, CONSULTATION DATE:  11/09/2019 REFERRING MD:  Dr. 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  She was started on therapy for pneumonia and UTI, and changed to lovenox.  Noted to have altered mental status after receiving pain medication.  On 11/09/19 developed worsening hypoxia with respiratory leading to cardiac arrest with ROSC after about 10 minutes.  She was intubated and started on pressors.  Past Medical History  Stage IV NSCLC, HTN, GERD  Significant Hospital Events   2/16 Admit 2/20 nephrology consulted 2/21 cardiac arrest  Consults:  Oncology Nephrology  Procedures:  Rt femoral CVL 2/16 >> ETT 2/21 >>   Significant Diagnostic Tests:  Renal u/s 2/20 >> mild increased Rt renal cortical echogenicity, no hydronephrosis  Micro Data:  SARS CoV2 TAT 2/16 >> negative Sputum 2/21 >>   Antimicrobials:  Vancomycin 2/16 >> 2/18 Cefepime 2/16 >>   Interim history/subjective:    Objective   Blood pressure 95/60, pulse (!) 107, temperature (!) 97 F (36.1 C), temperature source Axillary, resp. rate 19, height 5' 8" (1.727 m), weight 94.9 kg, SpO2 100 %.    Vent Mode: PRVC FiO2 (%):  [100 %] 100 % Set Rate:  [24 bmp] 24 bmp Vt Set:  [510 mL] 510 mL PEEP:  [5 cmH20] 5 cmH20 Plateau Pressure:  [27 cmH20] 27 cmH20   Intake/Output Summary (Last 24 hours) at 11/09/2019 0431 Last data filed at 11/08/2019 2000 Gross per 24 hour  Intake 882.99 ml  Output 100 ml  Net 782.99 ml   Filed Weights   11/09/2019 0449 11/06/19 0500 11/08/19 0600  Weight: 88 kg 89.2 kg 94.9 kg    Examination:  General  - unresponsive Eyes - pupils pinpoint ENT - ETT in place Cardiac - regular, tachycardic Chest - b/l crackles, Rt pleurx catheter in place Abdomen - soft, non tender, decreased bowel sounds Extremities - 2+ edema in upper extremities Skin - no rashes Neuro - withdraws extremities with stimulation   Resolved Hospital Problem list     Assessment & Plan:   Acute on chronic hypoxic/hypercapnic respiratory failure. - likely combination of hypoventilation from opiate medication use, pulmonary edema, aspiration pneumonia, and metastatic lung cancer - full vent support - f/u CXR - prn BDs  Bacterial HCAP and aspiration pneumonia. - day 7 of Abx, currently on cefepime  Cardiac arrest after respiratory arrest. Cardiogenic shock. - pressors to keep MAP > 65  Acute metabolic encephalopathy 2nd to renal failure, hypoxia, hypercapnia. - monitor mental status after cardiac arrest  AKI from ATN likely from vancomycin use and contrast dye. - baseline creatinine 1.08 from 10/20/2019 - nephrology consulted - she would not be a HD candidate given health status  PE with b/l upper extremity DVT. - continue lovenox  Anemia of critical illness and chronic disease. 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 - s/p palliative radiotherapy to suprahillar mass - started osimertinib (tagrisso) on 2/17 - oncology consulted  Rt  malignant pleural effusion. - drain pleurx catheter intermittently  Goals of care. - prognosis seems poor at this point - family coming in from [place]; once they arrive will need to discuss goals of care - defer further cardiac and neuro testing until family discussion  Pressure injuries. - Stage 2 sacrum, present on admission - Stage 2 Rt elbow, present on admission  Difficulty  IV access. - related to SVC syndrome - keep femoral CVL in place   Best practice:  Diet: tube feeds DVT prophylaxis: Lovenox GI prophylaxis: protonix Mobility: bed rest Code Status: full code Disposition: ICU  Labs   CBC: Recent Labs  Lab 11/03/2019 0304 11/09/2019 0304 11/05/19 0300 11/05/19 1431 11/06/19 0327 11/07/19 0500 11/08/19 0500  WBC 7.8  --  8.2  --  10.5 8.8 7.9  NEUTROABS 6.5  --   --   --   --   --   --   HGB 7.8*   < > 6.9* 7.4* 7.2* 7.3* 7.3*  HCT 25.9*   < > 22.8* 24.1* 23.4* 24.0* 25.4*  MCV 86.3  --  85.4  --  86.3 88.9 91.4  PLT 235  --  245  --  206 203 208   < > = values in this interval not displayed.    Basic Metabolic Panel: Recent Labs  Lab 11/06/2019 0304 11/12/2019 2128 11/05/19 0300 11/06/19 0327 11/07/19 0500 11/08/19 0500  NA 137  --  137 136 135 134*  K 3.1*  --  3.7 3.6 4.1 4.8  CL 98  --  99 98 97* 98  CO2 29  --  _0 GLUCOSE 129*  --  100* 140* 113* 131*  BUN 15  --  17 24* 34* 44*  CREATININE 1.08*  --  1.27* 1.75* 2.44* 3.04*  CALCIUM 8.4*  --  8.5* 8.3* 8.7* 8.8*  MG 1.3* 1.3* 1.6*  --  1.9  --    GFR: Estimated Creatinine Clearance: 18.4 mL/min (A) (by C-G formula based on SCr of 3.04 mg/dL (H)). Recent Labs  Lab 11/05/19 0300 11/06/19 0327 11/07/19 0500 11/08/19 0500  WBC 8.2 10.5 8.8 7.9    Liver Function Tests: Recent Labs  Lab 11/12/2019 0304 11/05/19 0300 11/06/19 0327 11/07/19 0500 11/08/19 0500  AST 26 32 37 38 34  ALT _1 ALKPHOS 192* 203* 202* 213* 221*  BILITOT 0.7 0.9 0.7 0.7 0.4  PROT 5.9* 5.6* 5.7* 6.0* 6.2*  ALBUMIN 2.2* 2.1* 2.0* 2.1* 2.2*   No results for input(s): LIPASE, AMYLASE in the last 168 hours. No results for input(s): AMMONIA in the last 168 hours.  ABG    Component Value Date/Time   PHART 7.241 (L) 11/09/2019 0340   PCO2ART 59.4 (H) 11/09/2019 0340   PO2ART 68.3 (L) 11/09/2019 0340   HCO3 25.0 11/09/2019 0340   ACIDBASEDEF 2.4 (H) 11/09/2019 0340    O2SAT 93.1 11/09/2019 0340     Coagulation Profile: Recent Labs  Lab 10/24/2019 0304  INR 1.3*    Cardiac Enzymes: No results for input(s): CKTOTAL, CKMB, CKMBINDEX, TROPONINI in the last 168 hours.  HbA1C: No results found for: HGBA1C  CBG: No results for input(s): GLUCAP in the last 168 hours.  Review of Systems:   Unable to obtain  Surgical History    Past Surgical History:  Procedure Laterality Date  . ABDOMINAL HYSTERECTOMY    . IR FLUORO GUIDE CV LINE RIGHT  10/31/2019  . IR US GUIDE  VASC ACCESS RIGHT  10/26/2019  . VIDEO BRONCHOSCOPY WITH ENDOBRONCHIAL ULTRASOUND N/A 09/25/2019   Procedure: VIDEO BRONCHOSCOPY WITH ENDOBRONCHIAL ULTRASOUND;  Surgeon: Melrose Nakayama, MD;  Location: [place];  Service: Thoracic;  Laterality: N/A;     Social History   reports that she quit smoking about 30 years ago. She has never used smokeless tobacco. She reports previous alcohol use. She reports that she does not use drugs.   Family History   Her family history is negative for Cancer.   Allergies No Known Allergies   Home Medications  Prior to Admission medications   Medication Sig Start Date End Date Taking? Authorizing Provider  acetaminophen (TYLENOL) 325 MG tablet Take 650 mg by mouth every 6 (six) hours as needed for mild pain.   Yes [provider]  albuterol (PROVENTIL) (2.5 MG/3ML) 0.083% nebulizer solution Take 2.5 mg by nebulization every 6 (six) hours.   Yes [provider]  ceFEPIme 1 g in sodium chloride 0.9 % 100 mL Inject 1 g into the vein every 6 (six) hours. Infuse over 30 mins   Yes [provider]  enoxaparin (LOVENOX) 100 MG/ML injection Inject 90 mg into the skin every 12 (twelve) hours.   Yes [provider]  furosemide (LASIX) 10 MG/ML solution Take 40 mg by mouth daily.   Yes [provider]  guaiFENesin-dextromethorphan (ROBITUSSIN DM) 100-10 MG/5ML syrup Take 10 mLs by mouth every 4 (four) hours as needed for  cough. 10/09/19  Yes Shelly Coss, MD  hydrALAZINE (APRESOLINE) 20 MG/ML injection Inject 10 mg into the vein every 4 (four) hours as needed (SBP>165 or DBP> 110).   Yes [provider]  metoprolol tartrate (LOPRESSOR) 25 MG tablet Take 12.5 mg by mouth 2 (two) times daily.   Yes [provider]  Multiple Vitamin (MULTIVITAMIN WITH MINERALS) TABS tablet Take 1 tablet by mouth daily. 10/10/19  Yes Shelly Coss, MD  ondansetron (ZOFRAN-ODT) 4 MG disintegrating tablet Take 4 mg by mouth every 8 (eight) hours as needed for nausea or vomiting.   Yes [provider]  pantoprazole (PROTONIX) 40 MG injection Inject 40 mg into the vein 2 (two) times daily.   Yes [provider]  senna-docusate (SENOKOT-S) 8.6-50 MG tablet Take 1 tablet by mouth 2 (two) times daily. 10/09/19  Yes Shelly Coss, MD  tamsulosin (FLOMAX) 0.4 MG CAPS capsule Take 1 capsule (0.4 mg total) by mouth daily. 10/10/19  Yes Shelly Coss, MD  traMADol (ULTRAM) 50 MG tablet Take 50 mg by mouth daily.   Yes [provider]  Vancomycin HCl 1000 MG/200ML SOLN Inject 1,000 mg into the vein every 18 (eighteen) hours. Infuse over 60 minutes   Yes [provider]  apixaban (ELIQUIS) 5 MG TABS tablet Take 1 tablet (5 mg total) by mouth 2 (two) times daily. Patient not taking: Reported on 11/05/2019 10/09/19   Shelly Coss, MD  feeding supplement, ENSURE ENLIVE, (ENSURE ENLIVE) LIQD Take 237 mLs by mouth 2 (two) times daily between meals. 10/09/19   Shelly Coss, MD  furosemide (LASIX) 20 MG tablet Take 1 tablet (20 mg total) by mouth daily. Patient not taking: Reported on 11/14/2019 10/10/19   Shelly Coss, MD  ipratropium-albuterol (DUONEB) 0.5-2.5 (3) MG/3ML SOLN Take 3 mLs by nebulization every 4 (four) hours as needed. Patient not taking: Reported on 10/29/2019 10/09/19   Shelly Coss, MD  osimertinib mesylate (TAGRISSO) 80 MG tablet Take 1 tablet (80 mg total) by mouth daily.  11/05/19  Curt Bears, MD  sodium chloride 1 g tablet Take 2 tablets (2 g total) by mouth 2 (two) times daily with a meal. Patient not taking: Reported on 11/02/2019 10/09/19   Shelly Coss, MD     Critical care time: 26 minutes     Chesley Mires, MD [place] 11/09/2019, 5:02 AM

## 2019-11-09 NOTE — Progress Notes (Signed)
Spoke with Dr. Gillermina Phy regarding bilateral upper extremities DVTs.  Received verbal authorization to obtain ABG sample in arm.

## 2019-11-09 NOTE — Progress Notes (Signed)
[facility] Progress Note    Assessment/ Plan:   1.  AKI on CKD: Unclear precipitant.  Renal US negative, no infarcts.  Holding Lasix.  Unsure if BP is accurate- could be hypotensive causing AKI.  Can't get BP on the arm.  Tagrisso and other TKIs can be associated with nephrotoxicity esp when Cr at a non-steady state.  Can cause AKI, proteinuria, TMA.  Will send UA and UP/C.  Had vanc at OSH too, vanc levels not that elevated but supratherapeutic on arrival; ? If recevied contrasted CT on 2/14.  She is not a dialysis candidate and in the setting of resp/ cardiac arrest we don't have a lot to offer.  Will sign off.  Call with questions.    2.  Resp/ cardiac arrest: with ROSC, per PCCM, vented now, prognosis poor.   3.  Stage IV Adenocarcinoma of the lung: Tagrisso started 2/18, had received palliative radiation with R perihilar mass  4.  Bilateral DVTs and PE: on Lovenox--> may need to adjust for AKI   5.  R loculated pleural effusion/ ? pneumonia: on cefepime, Pleurx got some drainage out today per nursing.  Looks like vanc was ordered but never received here d/t levels from OSh.  6.  Anemia: was having hemoptysis, transfuse prn  7.  Dispo: appears to be dying.  Son coming from [place].    Subjective:    Resp and cardiac arrest last night, vented, son coming down from [facility].   Objective:   BP (!) 109/48 (BP Location: Left Leg)   Pulse (!) 108   Temp 99 F (37.2 C) (Oral)   Resp (!) 28   Ht 5' 8"  (1.727 m)   Wt 94 kg   SpO2 98%   BMI 31.51 kg/m   Intake/Output Summary (Last 24 hours) at 11/09/2019 1435 Last data filed at 11/09/2019 1200 Gross per 24 hour  Intake 487.42 ml  Output 100 ml  Net 387.42 ml   Weight change: -0.9 kg  Physical Exam: Gen: NAD, intubated, unresponsive CVS: RRR Resp: coarse mechanical Abd: soft, nondistended Ext: trace LE edema, bilateral upper extremity 3++ anasarca  Imaging: DG Abd 1 View  Result Date: 11/09/2019 CLINICAL DATA:   OG tube placement. EXAM: ABDOMEN - 1 VIEW COMPARISON:  None. FINDINGS: Tip and side port of the enteric tube below the diaphragm in the stomach. No evidence of free air on supine view. Nonobstructive bowel gas pattern in the upper abdomen. IMPRESSION: Tip and side port of the enteric tube below the diaphragm in the stomach. Electronically Signed   By: Keith Rake M.D.   On: 11/09/2019 03:29   US RENAL  Result Date: 11/08/2019 CLINICAL DATA:  Poor urine output EXAM: RENAL / URINARY TRACT ULTRASOUND COMPLETE COMPARISON:  CT 09/26/2019 FINDINGS: Right Kidney: Renal measurements: 11.6 x 4.9 x 5.2 cm = volume: 153 mL. Increased cortical echogenicity. No mass or hydronephrosis visualized. 2.7 cm cyst over the mid pole. Left Kidney: Renal measurements: 11.9 x 4.5 x 5.9 cm = volume: 165 mL. Cortical echogenicity is normal. No mass or hydronephrosis visualized. Bladder: Appears normal for degree of bladder distention. Other: None. IMPRESSION: 1. Normal size kidneys without hydronephrosis. Mild increased right renal cortical echogenicity which can be seen with medical renal disease. 2.  2.7 cm right renal cyst. Electronically Signed   By: Marin Olp M.D.   On: 11/08/2019 11:41   DG Chest Port 1 View  Result Date: 11/09/2019 CLINICAL DATA:  Intubation. OG tube placement.  Post code. EXAM: PORTABLE CHEST 1 VIEW COMPARISON:  Radiograph yesterday. FINDINGS: Endotracheal tube tip approximately 3.4 cm from the carina. Enteric tube in place with tip below the diaphragm not included in the field of view. Again seen volume loss in the right hemithorax with right chest tube/PleurX catheter in place. Right upper lobe opacity has slightly worsened from prior exam. Right lung volume loss is unchanged. There progressive heterogeneous opacities throughout the left lung. Small left pleural effusion. Unchanged heart size and mediastinal contours. No pneumothorax visualized. IMPRESSION: 1. Endotracheal tube tip 3.4 cm from the  carina. Enteric tube in place with tip below the diaphragm not included in the field of view. 2. Worsening heterogeneous opacities throughout the left lung, may be pulmonary edema, pneumonia or aspiration. 3. Slight worsening aeration of the right lung with PleurX catheter in place, volume loss, pleuroparenchymal opacity in the upper hemithorax, patient with known lung cancer. Electronically Signed   By: Keith Rake M.D.   On: 11/09/2019 03:27   DG Chest Port 1V same Day  Result Date: 11/08/2019 CLINICAL DATA:  Acute respiratory failure with hypoxia. Stage IV right lung cancer. EXAM: PORTABLE CHEST 1 VIEW COMPARISON:  11/15/2019 and 212 FINDINGS: Right-sided PleurX catheter unchanged. Interval worsening opacification over the right mid to upper lung likely pleural fluid and atelectasis. Stable elevated right hemidiaphragm. Stable hazy opacification over the left upper lobe. Cardiomediastinal silhouette and remainder of the exam is unchanged. IMPRESSION: 1. Volume loss of the right lung with stable elevation right hemidiaphragm. Worsening opacification over the right mid to upper lung likely pleural fluid and atelectasis. Right PleurX catheter unchanged. 2.  Stable hazy opacification over the left upper lobe. Electronically Signed   By: Marin Olp M.D.   On: 11/08/2019 11:19    Labs: BMET Recent Labs  Lab 10/26/2019 0304 11/05/19 0300 11/06/19 0327 11/07/19 0500 11/08/19 0500 11/09/19 0606  NA 137 137 136 135 134* 135  K 3.1* 3.7 3.6 4.1 4.8 4.9  CL 98 99 98 97* 98 99  CO2 29 30 29 28 27 22   GLUCOSE 129* 100* 140* 113* 131* 140*  BUN 15 17 24* 34* 44* 51*  CREATININE 1.08* 1.27* 1.75* 2.44* 3.04* 3.81*  CALCIUM 8.4* 8.5* 8.3* 8.7* 8.8* 9.3  PHOS  --   --   --   --   --  3.7   CBC Recent Labs  Lab 11/15/2019 0304 11/05/19 0300 11/06/19 0327 11/07/19 0500 11/08/19 0500 11/09/19 0606  WBC 7.8   < > 10.5 8.8 7.9 9.1  NEUTROABS 6.5  --   --   --   --   --   HGB 7.8*   < > 7.2*  7.3* 7.3* 7.0*  HCT 25.9*   < > 23.4* 24.0* 25.4* 23.9*  MCV 86.3   < > 86.3 88.9 91.4 89.2  PLT 235   < > 206 203 208 194   < > = values in this interval not displayed.    Medications:    . chlorhexidine gluconate (MEDLINE KIT)  15 mL Mouth Rinse BID  . Chlorhexidine Gluconate Cloth  6 each Topical Daily  . enoxaparin (LOVENOX) injection  90 mg Subcutaneous Q24H  . feeding supplement (PRO-STAT SUGAR FREE 64)  30 mL Per Tube BID  . feeding supplement (VITAL HIGH PROTEIN)  1,000 mL Per Tube Q24H  . mouth rinse  15 mL Mouth Rinse 10 times per day  . multivitamin with minerals  1 tablet Per Tube Daily  .  osimertinib mesylate  80 mg Oral Daily  . pantoprazole sodium  40 mg Per Tube Daily  . sodium chloride flush  10-40 mL Intracatheter Q12H      Madelon Lips, MD 11/09/2019, 2:35 PM

## 2019-11-09 NOTE — Progress Notes (Signed)
eLink Physician-Brief Progress Note Patient Name: Niara Bunker DOB: 05-30-1941 MRN: [id number]   Date of Service  11/09/2019  HPI/Events of Note  This patient suffered a PEA arrest for which she received 3 rounds of CPR/epinephrine with ROSC achieved. She was intubated during the code.  Currently ST 105 bpm, RR 20 on vent (520x20, 5, 100%), BP 114/45 (MAP 61), SpO2 100%.  The patient has a complicated medical history including stage IV non-small lung cancer, SVC syndrome and b/l upper extremity DVTs as well as recent loculated R pleural effusion.  CXR post-intubation / post-code shows slight worsening of opacities throughout lung fields but is otherwise unchanged.  Cause of the arrest seems most likely to have been respiratory decompensation / hypoxemic arrest.   eICU Interventions  # Neuro: - Sedation with prop/fentanyl  # Cardiac: - Support BP with levophed as needed to maintain MAP >\= 73mmHg.  # Respiratory: - Full vent support. VC 520x20, 5, 100%. - Obtain ABG. If unable to obtain ABG from radial artery due to UE edema, we may need to place a femoral arterial line.  # ID: - Remains on cefepime for loculated pleural effusion.  # Heme: - Continue therapeutic anticoagulation with Lovenox for extensive DVTs. - Mgmt of oncologic issues per Onc service.  GI PPX: Protonix DVT PPX: Therapeutic A/C Code Status: Full Code     Intervention Category Major Interventions: Code management / supervision  Marily Lente Lataysha Vohra 11/09/2019, 3:10 AM

## 2019-11-09 NOTE — Progress Notes (Signed)
[place] for Lovenox Indication:  PE, bil UE DVTs  No Known Allergies  Patient Measurements: Height: _0  (172.7 cm) Weight: 207 lb 3.7 oz (94 kg) IBW/kg (Calculated) : 63.9 Heparin Dosing Weight: 89.2 kg  Vital Signs: Temp: 99.2 F (37.3 C) (02/21 1730) Temp Source: Axillary (02/21 1730) BP: 99/50 (02/21 1800) Pulse Rate: 110 (02/21 1800)  Labs: Recent Labs    11/07/19 0500 11/07/19 0500 11/08/19 0500 11/09/19 0606 11/09/19 1300  HGB 7.3*   < > 7.3* 7.0*  --   HCT 24.0*  --  25.4* 23.9*  --   PLT 203  --  208 194  --   HEPARINUNFRC  --   --   --   --  2.02*  CREATININE 2.44*  --  3.04* 3.81*  --    < > = values in this interval not displayed.    Estimated Creatinine Clearance: 14.6 mL/min (A) (by C-G formula based on SCr of 3.81 mg/dL (H)).   Medical History: Past Medical History:  Diagnosis Date  . Uterine cancer (HCC)    Medications:  Scheduled:  . chlorhexidine gluconate (MEDLINE KIT)  15 mL Mouth Rinse BID  . Chlorhexidine Gluconate Cloth  6 each Topical Daily  . enoxaparin (LOVENOX) injection  90 mg Subcutaneous Q24H  . feeding supplement (PRO-STAT SUGAR FREE 64)  30 mL Per Tube BID  . feeding supplement (VITAL HIGH PROTEIN)  1,000 mL Per Tube Q24H  . mouth rinse  15 mL Mouth Rinse 10 times per day  . multivitamin with minerals  1 tablet Per Tube Daily  . osimertinib mesylate  80 mg Oral Daily  . pantoprazole sodium  40 mg Per Tube Daily  . sodium chloride flush  10-40 mL Intracatheter Q12H    Assessment: 89 [facility] with ShOB. Continued [facility] 35[facility] SQ [facility]12 [facility], bil UE DVTs, PleurX    Lovenox adjusted to 932mSQ q24 on 2/20 at 1000 due to worsening SCr and acute renal failure and received 2 doses of this freq. Dose today was given at 0840 and an antiXa level was checked 7 hours after the dose was given. It should have been drawn 4 hours after the dose was given in  order to get an accurate peak.   The level is SUPRAtherapeutic (goal 0.6-1) despite being checked 3 hours late and therefore was much higher at 4 hour peak.   No reported bleeding per RN  Hgb stable around 7  Goal of Therapy:  Monitor platelets by anticoagulation protocol: Yes   Plan:   Will hold Lovenox at this point due to elevated antiXa level as a result of ARF  Check another antiXa level tomorrow AM to see how quickly level is trending down   LeKara Mead/21/2021,6:30 PM

## 2019-11-09 NOTE — ED Provider Notes (Deleted)
I was called to evaluate the patient due to cardiac arrest On my arrival to room CPR was in progress. Patient was intubated by CRNA After multiple rounds of CPR including bicarb and calcium, patient did regain pulses. Seen in conjunction with Dr. Legrand Pitts, hospitalist, who will take over care of patient   CPR Procedure Note I PERSONALLY DIRECTED ANCILLARY STAFF OR/PERFORMED CPR IN AN EFFORT TO REGAIN RETURN OF SPONTANEOUS CIRCULATION IN AN EFFORT TO MAINTAIN NEURO, CARDIAC AND SYSTEMIC PERFUSION    Ripley Fraise, MD 11/09/19 [phone]

## 2019-11-09 NOTE — Progress Notes (Signed)
PCCM interval note  Patient examined on a.m. rounds Remains on the ventilator, Levophed for blood pressure support Still in anuric renal failure  Blood pressure (!) 120/52, pulse (!) 107, temperature 98.4 F (36.9 C), temperature source Axillary, resp. rate (!) 28, height 5\' 8"  (1.727 m), weight 94 kg, SpO2 100 %. Gen:      No acute distress, chronically ill-appearing HEENT:  EOMI, sclera anicteric, ETT Neck:     No masses; no thyromegaly Lungs:    Clear to auscultation bilaterally; normal respiratory effort CV:         Regular rate and rhythm; no murmurs Abd:      + bowel sounds; soft, non-tender; no palpable masses, no distension Ext:    2+ edema; adequate peripheral perfusion Skin:      Warm and dry; no rash Neuro: Sedated, unresponsive  Assessment/plan:  Cardiac arrest secondary to respiratory failure Aspiration, pulmonary edema, metastatic lung cancer, HCAP Has history of stage IV cancer, SVC syndrome, PE, malignant right effusion Acute kidney injury  Continue supportive care for now Agree with nephrology that she is not a candidate for renal replacement therapy Spoke with son over telephone who is driving from [place].  Expected to arrive today afternoon Continue goals of care discussion once they arrive.  The patient is critically ill with multiple organ system failure and requires high complexity decision making for assessment and support, frequent evaluation and titration of therapies, advanced monitoring, review of radiographic studies and interpretation of complex data.   Critical Care Time devoted to patient care services, exclusive of separately billable procedures, described in this note is 35 minutes.   Marshell Garfinkel MD [facility] Please see [email or web address] for pager details.  11/09/2019, 9:30 AM

## 2019-11-09 NOTE — Progress Notes (Signed)
RN called son- [name]- who is listed as patient contact. Updated son that patient went into cardiac arrest and cpr was in progress. RN was informed that son lives in [place] and was on his way down to [facility]. Son asked RN to contact cousin-Brian- who was not in chart at time. After discussion with house coverage, RN called son back and informed him that phone call could not be completed since patient was unable to give permission to provide information. Son informed this RN that he would contact Aaron Edelman of the situation and have him come up to the hospital. At this time, no family has visited.
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
## 2019-11-09 NOTE — Plan of Care (Signed)
  Interdisciplinary Goals of Care Family Meeting   Date carried out:: 11/09/2019  Location of the meeting: Conference room  Member's involved: Physician, Bedside Registered Nurse and Family Member or next of kin  Durable Power of Attorney or acting medical decision maker:  Emily Kim (Son), Daughter, Emily Kim. Other son participated over telephone    Discussion: We discussed goals of care for Emily Kim .  Discussed clinical course to date and deteriorating status including recent cardiac arrest, renal failure, shock requiring pressors.  Per discussion with nephrology she is not a candidate for renal replacement.  Shared with family that it is unlikely that [name] would have any meaningful recovery or if she will be able to survive this episode.  Discussed transitioning to comfort care but family wants to talk among themselves before deciding on that.  In the interim we have decided to transition to DNR.  Continue current level of care including pressors with no escalation.  Code status: Limited Code or DNR with short term, Mechanical ventilation, Cental IV access and IV vasoactive drugs  Disposition: Continue current acute care  Time spent for the meeting: 35 mins  Emily Kim 11/09/2019, 4:33 PM

## 2019-11-10 ENCOUNTER — Inpatient Hospital Stay (HOSPITAL_COMMUNITY): Payer: Medicare Other

## 2019-11-10 ENCOUNTER — Encounter (HOSPITAL_COMMUNITY): Payer: Self-pay | Admitting: Internal Medicine

## 2019-11-10 DIAGNOSIS — I469 Cardiac arrest, cause unspecified: Secondary | ICD-10-CM

## 2019-11-10 LAB — CBC
HCT: 19.8 % — ABNORMAL LOW (ref 36.0–46.0)
HCT: 19.1 % — ABNORMAL LOW (ref 36.0–46.0)
Hemoglobin: 6.3 g/dL — CL (ref 12.0–15.0)
Hemoglobin: 6.1 g/dL — CL (ref 12.0–15.0)
MCH: 26.5 pg (ref 26.0–34.0)
MCH: 26.4 pg (ref 26.0–34.0)
MCHC: 31.8 g/dL (ref 30.0–36.0)
MCHC: 31.9 g/dL (ref 30.0–36.0)
MCV: 83.2 fL (ref 80.0–100.0)
MCV: 82.7 fL (ref 80.0–100.0)
Platelets: 142 10*3/uL — ABNORMAL LOW (ref 150–400)
Platelets: 127 10*3/uL — ABNORMAL LOW (ref 150–400)
RBC: 2.38 MIL/uL — ABNORMAL LOW (ref 3.87–5.11)
RBC: 2.31 MIL/uL — ABNORMAL LOW (ref 3.87–5.11)
RDW: 20.1 % — ABNORMAL HIGH (ref 11.5–15.5)
RDW: 19.9 % — ABNORMAL HIGH (ref 11.5–15.5)
WBC: 10 10*3/uL (ref 4.0–10.5)
WBC: 10.3 10*3/uL (ref 4.0–10.5)
nRBC: 0.2 % (ref 0.0–0.2)
nRBC: 0.3 % — ABNORMAL HIGH (ref 0.0–0.2)

## 2019-11-10 LAB — COMPREHENSIVE METABOLIC PANEL
ALT: 116 U/L — ABNORMAL HIGH (ref 0–44)
AST: 193 U/L — ABNORMAL HIGH (ref 15–41)
Albumin: 1.8 g/dL — ABNORMAL LOW (ref 3.5–5.0)
Alkaline Phosphatase: 179 U/L — ABNORMAL HIGH (ref 38–126)
Anion gap: 11 (ref 5–15)
BUN: 64 mg/dL — ABNORMAL HIGH (ref 8–23)
CO2: 24 mmol/L (ref 22–32)
Calcium: 8.4 mg/dL — ABNORMAL LOW (ref 8.9–10.3)
Chloride: 100 mmol/L (ref 98–111)
Creatinine, Ser: 4.49 mg/dL — ABNORMAL HIGH (ref 0.44–1.00)
GFR calc Af Amer: 10 mL/min — ABNORMAL LOW (ref >60)
GFR calc non Af Amer: 9 mL/min — ABNORMAL LOW (ref >60)
Glucose, Bld: 167 mg/dL — ABNORMAL HIGH (ref 70–99)
Potassium: 4.4 mmol/L (ref 3.5–5.1)
Sodium: 135 mmol/L (ref 135–145)
Total Bilirubin: 0.8 mg/dL (ref 0.3–1.2)
Total Protein: 5.3 g/dL — ABNORMAL LOW (ref 6.5–8.1)

## 2019-11-10 LAB — HEMOGLOBIN AND HEMATOCRIT, BLOOD
HCT: 23 % — ABNORMAL LOW (ref 36.0–46.0)
Hemoglobin: 7.3 g/dL — ABNORMAL LOW (ref 12.0–15.0)

## 2019-11-10 LAB — BPAM RBC
Blood Product Expiration Date: 202103172359
Blood Product Expiration Date: 202103172359
ISSUE DATE / TIME: 202102171000
Unit Type and Rh: 7300
Unit Type and Rh: 7300

## 2019-11-10 LAB — MAGNESIUM
Magnesium: 2.1 mg/dL (ref 1.7–2.4)
Magnesium: 2 mg/dL (ref 1.7–2.4)

## 2019-11-10 LAB — GLUCOSE, CAPILLARY
Glucose-Capillary: 136 mg/dL — ABNORMAL HIGH (ref 70–99)
Glucose-Capillary: 136 mg/dL — ABNORMAL HIGH (ref 70–99)
Glucose-Capillary: 142 mg/dL — ABNORMAL HIGH (ref 70–99)
Glucose-Capillary: 143 mg/dL — ABNORMAL HIGH (ref 70–99)
Glucose-Capillary: 153 mg/dL — ABNORMAL HIGH (ref 70–99)
Glucose-Capillary: 170 mg/dL — ABNORMAL HIGH (ref 70–99)

## 2019-11-10 LAB — PHOSPHORUS
Phosphorus: 1.4 mg/dL — ABNORMAL LOW (ref 2.5–4.6)
Phosphorus: 2.4 mg/dL — ABNORMAL LOW (ref 2.5–4.6)

## 2019-11-10 LAB — TYPE AND SCREEN
ABO/RH(D): B POS
Antibody Screen: NEGATIVE
Unit division: 0
Unit division: 0

## 2019-11-10 LAB — ECHOCARDIOGRAM COMPLETE
Height: 68 in
Weight: 3361.57 oz

## 2019-11-10 LAB — HEMOGLOBIN A1C
Hgb A1c MFr Bld: 5.4 % (ref 4.8–5.6)
Mean Plasma Glucose: 108.28 mg/dL

## 2019-11-10 LAB — TRIGLYCERIDES: Triglycerides: 81 mg/dL (ref <150)

## 2019-11-10 LAB — CORTISOL: Cortisol, Plasma: 16 ug/dL

## 2019-11-10 LAB — HEPARIN LEVEL (UNFRACTIONATED): Heparin Unfractionated: 1.22 IU/mL — ABNORMAL HIGH (ref 0.30–0.70)

## 2019-11-10 LAB — HEPARIN ANTI-XA: Heparin LMW: 1.56 IU/mL

## 2019-11-10 LAB — PREPARE RBC (CROSSMATCH)

## 2019-11-10 MED ORDER — SODIUM PHOSPHATES 45 MMOLE/15ML IV SOLN
30.0000 mmol | Freq: Once | INTRAVENOUS | Status: AC
  Administered 2019-11-10: 13:00:00 30 mmol via INTRAVENOUS
  Filled 2019-11-10: qty 10

## 2019-11-10 MED ORDER — SODIUM CHLORIDE 0.9% IV SOLUTION: Freq: Once | INTRAVENOUS | Status: DC

## 2019-11-10 MED ORDER — INSULIN ASPART 100 UNIT/ML ~~LOC~~ SOLN
0.0000 [IU] | SUBCUTANEOUS | Status: DC
  Administered 2019-11-10: 16:00:00 1 [IU] via SUBCUTANEOUS
  Administered 2019-11-10: 2 [IU] via SUBCUTANEOUS
  Administered 2019-11-10: 1 [IU] via SUBCUTANEOUS
  Administered 2019-11-10: 13:00:00 2 [IU] via SUBCUTANEOUS
  Administered 2019-11-10: 20:00:00 1 [IU] via SUBCUTANEOUS
  Administered 2019-11-10: 04:00:00 2 [IU] via SUBCUTANEOUS
  Administered 2019-11-10 – 2019-11-12 (×9): 1 [IU] via SUBCUTANEOUS

## 2019-11-10 MED ORDER — VITAL HIGH PROTEIN PO LIQD
1000.0000 mL | ORAL | Status: DC
  Administered 2019-11-11 – 2019-11-13 (×3): 1000 mL

## 2019-11-10 MED ORDER — HEPARIN (PORCINE) 25000 UT/250ML-% IV SOLN
1200.0000 [IU]/h | INTRAVENOUS | Status: DC
  Administered 2019-11-10: 13:00:00 1200 [IU]/h via INTRAVENOUS
  Filled 2019-11-10: qty 250

## 2019-11-10 MED ORDER — HEPARIN (PORCINE) 25000 UT/250ML-% IV SOLN
1100.0000 [IU]/h | INTRAVENOUS | Status: DC
  Filled 2019-11-10: qty 250

## 2019-11-10 NOTE — Progress Notes (Signed)
  Echocardiogram 2D Echocardiogram has been performed.  Emily Kim 11/10/2019, 1:50 PM

## 2019-11-10 NOTE — Progress Notes (Signed)
Nutrition Follow-up  DOCUMENTATION CODES:   Not applicable  INTERVENTION:  Vital High Protein @ 50 ml/hr (1200 ml/day) with 30 ml Pro-stat BID via tube  This regimen provides 1400 kcal (1701 kcal with propofol at current rate), 135 grams of protein, and 1008 ml free water   NUTRITION DIAGNOSIS:   Increased nutrient needs related to acute illness, chronic illness, cancer and cancer related treatments as evidenced by estimated needs.  Addressing via tube feedings  GOAL:   Patient will meet greater than or equal to 90% of their needs  Progressing  MONITOR:   PO intake, Supplement acceptance, Labs, Weight trends, Skin  REASON FOR ASSESSMENT:   Ventilator, Consult Enteral/tube feeding initiation and management  ASSESSMENT:   79 y.o. female with medical history of metastatic, stage 4 NSCLC s/p radiation, SVC syndrome. She was admitted to [facility] 2/9-2/15 before being transferred to [facility]. She went to [facility] due to SOB. CT showed persistent pulmonary embolism and Doppler showed bilateral BUE DVTs. She has a Pleurx and PTA it was found to be draining poorly.  2/21 - pt developed worsening hypoxia with respiratory leading to cardiac arrest with ROSC after approximately 10 minutes. Intubated and started on pressors.   Per notes: - malignant effusion, DVTs - stage IV adenocarcinoma of the right lung - progressively worsening anemia--hemoptysis and hemoccult positive - anuric renal failure, not a candidate for CRRT - goals of care family discussions ongoing, continue current level of care including pressors with no escalation  Current wt 95.3 kg (209.66 lbs) Very deep BUE edema, non-pitting BLE noted per RN assessment. Will use admit weight 89.2 kg for estimated needs.   Patient is currently intubated on ventilator support MV: 13.8  L/min Temp (24hrs), Avg:99.1 F (37.3 C), Min:98.6 F (37 C), Max:99.4 F (37.4 C)  Propofol: 11.39 ml/hr provides 301  kcal daily   Diet Order:   Diet Order            Diet NPO time specified  Diet effective now              EDUCATION NEEDS:   No education needs have been identified at this time  Skin:  Skin Assessment: Skin Integrity Issues: Skin Integrity Issues:: Stage II Stage II: sacrum; R elbow  Last BM:  2/16  Height:   Ht Readings from Last 1 Encounters:  11/15/2019 5\' 8"  (1.727 m)    Weight:   Wt Readings from Last 1 Encounters:  11/10/19 95.3 kg    Ideal Body Weight:  63.6 kg  BMI:  Body mass index is 31.95 kg/m.  Estimated Nutritional Needs:   Kcal:  6712  Protein:  125-143  Fluid:  >/= 1.8 L/day   Lajuan Lines, RD, LDN Clinical Nutrition Office [phone] After Hours/Weekend Pager # in [facility]

## 2019-11-10 NOTE — Progress Notes (Signed)
ANTICOAGULATION CONSULT NOTE - Follow Up Consult  Pharmacy Consult for heparin Indication: pulmonary embolus, upper bilateral extremity DVT  No Known Allergies  Patient Measurements: Height: 5\' 8"  (172.7 cm) Weight: 210 lb 1.6 oz (95.3 kg) IBW/kg (Calculated) : 63.9 Heparin Dosing Weight:   Vital Signs: Temp: 97.9 F (36.6 C) (02/22 1959) Temp Source: Axillary (02/22 1959) BP: 124/49 (02/22 2230) Pulse Rate: 102 (02/22 2200)  Labs: Recent Labs    11/08/19 0500 11/08/19 0500 11/09/19 0606 11/09/19 0606 11/09/19 1300 11/10/19 0600 11/10/19 1238 11/10/19 2035  HGB 7.3*   < > 7.0*   < >  --  6.3* 6.1*  --   HCT 25.4*   < > 23.9*  --   --  19.8* 19.1*  --   PLT 208   < > 194  --   --  142* 127*  --   HEPARINUNFRC  --   --   --   --  2.02*  --   --  1.22*  HEPRLOWMOCWT  --   --   --   --   --  1.56  --   --   CREATININE 3.04*  --  3.81*  --   --  4.49*  --   --    < > = values in this interval not displayed.    Estimated Creatinine Clearance: 12.5 mL/min (A) (by C-G formula based on SCr of 4.49 mg/dL (H)).   Medications:  Infusions:  . sodium chloride 10 mL/hr at 11/08/19 0900  . fentaNYL infusion INTRAVENOUS 75 mcg/hr (11/10/19 2008)  . heparin    . norepinephrine (LEVOPHED) Adult infusion 6 mcg/min (11/10/19 2035)  . propofol (DIPRIVAN) infusion 10 mcg/kg/min (11/10/19 2214)    Assessment: Patient with high heparin level and very poor renal function.  No heparin issues per RN.  I feel the high heparin level is mostly to all due to prior enoxaparin last dose charted 2/21 at [facility].   Prior heparin level 2.02 2/21 1300.     Goal of Therapy:  Heparin level 0.3-0.7 units/ml Monitor platelets by anticoagulation protocol: Yes   Plan:  Hold heparin ~1hr Restart heparin at 2330 at 1100 units/hr Recheck level at 0800.  Nani Skillern Crowford 11/10/2019,10:35 PM

## 2019-11-10 NOTE — Progress Notes (Signed)
eLink Physician-Brief Progress Note Patient Name: Emily Kim DOB: 05-23-1941 MRN: [id number]   Date of Service  11/10/2019  HPI/Events of Note  Notified of H/h 6.3/19.8. No sign of active bleed. No urine output with signficant edema. On going discussion regarding goals of care  eICU Interventions  Will defer to bedside MD regarding need for transfusion as patient significantly volume overloaded     Intervention Category Major Interventions: Other:  Judd Lien 11/10/2019, 6:43 AM

## 2019-11-10 NOTE — Progress Notes (Signed)
 NAME:  Emily Kim, MRN:  [id number], DOB:  12/01/1940, LOS: 6 ADMISSION DATE:  10/31/2019, CONSULTATION DATE:  11/09/2019 REFERRING MD:  Dr. 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  She was started on therapy for pneumonia and UTI, and changed to lovenox.  Noted to have altered mental status after receiving pain medication.  On 11/09/19 developed worsening hypoxia with respiratory leading to cardiac arrest with ROSC after about 10 minutes.  She was intubated and started on pressors.  Past Medical History  Stage IV NSCLC, HTN, GERD  Significant Hospital Events   2/16 Admit 2/20 nephrology consulted 2/21 cardiac arrest  Consults:  Oncology Nephrology  Procedures:  Rt femoral CVL 2/16 >> ETT 2/21 >>   Significant Diagnostic Tests:  Renal u/s 2/20 > mild increased Rt renal cortical echogenicity, no hydronephrosis  Micro Data:  SARS CoV2 TAT 2/16 > negative MRSA PCR 2/16 > Negative  Sputum 2/21 >>   Antimicrobials:  Vancomycin 2/16 > 2/18 Cefepime 2/16 >>   Interim history/subjective:  Remains on Vent Support and Levophed. Hemoglobin low this AM at 6.3.   Scheduled Meds: . chlorhexidine gluconate (MEDLINE KIT)  15 mL Mouth Rinse BID  . Chlorhexidine Gluconate Cloth  6 each Topical Daily  . feeding supplement (PRO-STAT SUGAR FREE 64)  30 mL Per Tube BID  . [START ON 11/11/2019] feeding supplement (VITAL HIGH PROTEIN)  1,000 mL Per Tube Q24H  . insulin aspart  0-9 Units Subcutaneous Q4H  . mouth rinse  15 mL Mouth Rinse 10 times per day  . multivitamin with minerals  1 tablet Per Tube Daily  . osimertinib mesylate  80 mg Oral Daily  . pantoprazole sodium  40 mg Per Tube Daily  .  sodium chloride flush  10-40 mL Intracatheter Q12H   Continuous Infusions: . sodium chloride 10 mL/hr at 11/08/19 0900  . ceFEPime (MAXIPIME) IV Stopped (11/09/19 2127)  . fentaNYL infusion INTRAVENOUS 50 mcg/hr (11/10/19 0000)  . norepinephrine (LEVOPHED) Adult infusion 8 mcg/min (11/10/19 0538)  . propofol (DIPRIVAN) infusion 5 mcg/kg/min (11/10/19 1046)  . sodium phosphate  Dextrose 5% IVPB     PRN Meds:.acetaminophen **OR** acetaminophen, albuterol, bisacodyl, fentaNYL, [DISCONTINUED] ondansetron **OR** ondansetron (ZOFRAN) IV, senna-docusate, sodium chloride flush  Objective   Blood pressure (!) 116/59, pulse (!) 112, temperature 99.4 F (37.4 C), temperature source Axillary, resp. rate (!) 0, height 5' 8" (1.727 m), weight 95.3 kg, SpO2 98 %.    Vent Mode: PRVC FiO2 (%):  [60 %] 60 % Set Rate:  [28 bmp-510 bmp] 510 bmp Vt Set:  [510 mL] 510 mL PEEP:  [5 cmH20] 5 cmH20 Plateau Pressure:  [23 cmH20-27 cmH20] 26 cmH20   Intake/Output Summary (Last 24 hours) at 11/10/2019 1112 Last data filed at 11/10/2019 0000 Gross per 24 hour  Intake 490.65 ml  Output 25 ml  Net 465.65 ml   Filed Weights   11/08/19 0600 11/09/19 0500 11/10/19 0500  Weight: 94.9 kg 94 kg 95.3 kg    Examination: General - critically ill elderly female on vent  HEENT: OG/ETT in place  Cardiac - Tachy, no MRG Chest - Diminished breath sounds, no crackles/wheeze  Abdomen - soft, active bowel sounds  Extremities -   2+ edema in upper extremities Skin - no rashes Neuro - alert, does not track, left side preference noted    Resolved Hospital Problem list     Assessment & Plan:   Acute on chronic hypoxic/hypercapnic respiratory failure. - likely combination of hypoventilation from opiate medication use, pulmonary edema, aspiration pneumonia, and metastatic lung cancer Bacterial HCAP and aspiration pneumonia. Plan  -Vent Support (currently on PRVC 60/5 TV 510)  -Wean as able  -VAP Bundle  - Completing  Day 7/7 of Cefepime today   Cardiac arrest after respiratory arrest. Cardiogenic shock. PE with b/l upper extremity DVT. Plan -Cardiac Monitoring  -pressors to keep MAP > 65 (Currently on 8 mcg/min levophed)  -Send Cortisol Level  -ECHO pending  -Transition to Heparin gtt given kidney function   Acute metabolic encephalopathy 2nd to renal failure, hypoxia, hypercapnia. Plan - q4h Neuro Checks - Obtain CT head   AKI from [facility]N likely from vancomycin use and contrast dye. - baseline creatinine 1.08 from 10/21/2019 Plan - nephrology consulted >> not HD Candidate  -Trend BMP - Replace electrolytes as indicated   Anemia of critical illness and chronic disease. 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 Right Malignant Pleural Effusion  Plan - Oncology Following  - s/p palliative radiotherapy to suprahillar mass - started osimertinib (tagrisso) on 2/17 - drain pleurx catheter intermittently  Pressure injuries. Plan - Stage 2 sacrum, present on admission - Stage 2 Rt elbow, present on admission  Difficulty IV access. Plan - related to SVC syndrome - keep femoral CVL in place  Goals of care. -Family Conversation 2/21 Limited Code. Wants a couple of days before deciding on comfort measures   Best practice:  Diet: tube feeds DVT prophylaxis: Heparin  GI prophylaxis: protonix Mobility: bed rest Code Status: Limited Code  Disposition: ICU  Labs   CBC: Recent Labs  Lab 10/23/2019 0304 11/05/19 0300 11/06/19 0327 11/07/19 0500 11/08/19 0500 11/09/19 0606 11/10/19 0600  WBC 7.8   < > 10.5 8.8 7.9 9.1 10.0  NEUTROABS 6.5  --   --   --   --   --   --   HGB 7.8*   < > 7.2* 7.3* 7.3* 7.0* 6.3*  HCT 25.9*   < > 23.4* 24.0* 25.4* 23.9* 19.8*  MCV 86.3   < > 86.3 88.9 91.4 89.2  83.2  PLT 235   < > 206 203 208 194 142*   < > = values in this interval not displayed.    Basic Metabolic Panel: Recent Labs  Lab 11/05/19 0300 11/05/19 0300 11/06/19 0327 11/07/19 0500 11/08/19 0500 11/09/19 0606 11/09/19 1700 11/10/19 0600  NA 137   < > 136 135 134* 135  --  135  K 3.7   < > 3.6 4.1 4.8 4.9  --  4.4  CL 99   < > 98 97* 98 99  --  100  CO2 30   < > 29 28 27 22  --  24  GLUCOSE 100*   < > 140* 113* 131* 140*  --  167*  BUN 17   < > 24* 34* 44* 51*  --  64*  CREATININE 1.27*   < > 1.75* 2.44* 3.04* 3.81*  --  4.49*  CALCIUM 8.5*   < > 8.3* 8.7* 8.8* 9.3  --    8.4*  MG 1.6*  --   --  1.9  --  2.1 2.0 2.1  PHOS  --   --   --   --   --  3.7 1.9* 1.4*   < > = values in this interval not displayed.   GFR: Estimated Creatinine Clearance: 12.5 mL/min (A) (by C-G formula based on SCr of 4.49 mg/dL (H)). Recent Labs  Lab 11/07/19 0500 11/08/19 0500 11/09/19 0606 11/10/19 0600  WBC 8.8 7.9 9.1 10.0    Liver Function Tests: Recent Labs  Lab 11/05/19 0300 11/06/19 0327 11/07/19 0500 11/08/19 0500 11/10/19 0600  AST 32 37 38 34 193*  ALT 16 20 21 22 116*  ALKPHOS 203* 202* 213* 221* 179*  BILITOT 0.9 0.7 0.7 0.4 0.8  PROT 5.6* 5.7* 6.0* 6.2* 5.3*  ALBUMIN 2.1* 2.0* 2.1* 2.2* 1.8*   No results for input(s): LIPASE, AMYLASE in the last 168 hours. No results for input(s): AMMONIA in the last 168 hours.  ABG    Component Value Date/Time   PHART 7.241 (L) 11/09/2019 0340   PCO2ART 59.4 (H) 11/09/2019 0340   PO2ART 68.3 (L) 11/09/2019 0340   HCO3 25.0 11/09/2019 0340   ACIDBASEDEF 2.4 (H) 11/09/2019 0340   O2SAT 93.1 11/09/2019 0340     Coagulation Profile: Recent Labs  Lab 10/28/2019 0304  INR 1.3*    Cardiac Enzymes: No results for input(s): CKTOTAL, CKMB, CKMBINDEX, TROPONINI in the last 168 hours.  HbA1C: Hgb A1c MFr Bld  Date/Time Value Ref Range Status  11/10/2019 06:00 AM 5.4 4.8 - 5.6 % Final    Comment:    (NOTE) Pre diabetes:           5.7%-6.4% Diabetes:              >6.4% Glycemic control for   <7.0% adults with diabetes     CBG: Recent Labs  Lab 11/09/19 1528 11/09/19 1943 11/09/19 2310 11/10/19 0311 11/10/19 0838  GLUCAP 139* 171* 185* 170* 143*    Review of Systems:   Unable to obtain  Surgical History    Past Surgical History:  Procedure Laterality Date  . ABDOMINAL HYSTERECTOMY    . IR FLUORO GUIDE CV LINE RIGHT  10/29/2019  . IR US GUIDE VASC ACCESS RIGHT  11/09/2019  . VIDEO BRONCHOSCOPY WITH ENDOBRONCHIAL ULTRASOUND N/A 09/25/2019   Procedure: VIDEO BRONCHOSCOPY WITH ENDOBRONCHIAL ULTRASOUND;  Surgeon: Hendrickson, Steven C, MD;  Location: [facility] OR;  Service: Thoracic;  Laterality: N/A;     Social History   reports that she quit smoking about 30 years ago. She has never used smokeless tobacco. She reports previous alcohol use. She reports that she does not use drugs.   Family History   Her family history is negative for Cancer.   Allergies No Known Allergies   Home Medications  Prior to Admission medications   Medication Sig Start Date End Date Taking? Authorizing Provider  acetaminophen (TYLENOL) 325 MG tablet Take 650 mg by mouth every 6 (six) hours as needed for mild pain.   Yes [provider]  albuterol (PROVENTIL) (2.5 MG/3ML) 0.083% nebulizer solution Take 2.5 mg by nebulization every 6 (six) hours.   Yes [provider]  ceFEPIme 1 g in sodium chloride 0.9 % 100 mL Inject 1 g into the vein every 6 (six) hours. Infuse over 30 mins   Yes [provider]  enoxaparin (LOVENOX) 100 MG/ML injection Inject 90 mg into the skin every 12 (twelve) hours.     Yes [provider]  furosemide (LASIX) 10 MG/ML solution Take 40 mg by mouth daily.   Yes [provider]  guaiFENesin-dextromethorphan (ROBITUSSIN DM) 100-10 MG/5ML syrup Take 10 mLs by mouth every 4 (four) hours as needed for cough. 10/09/19  Yes Adhikari, Amrit, MD  hydrALAZINE (APRESOLINE) 20  MG/ML injection Inject 10 mg into the vein every 4 (four) hours as needed (SBP>165 or DBP> 110).   Yes [provider]  metoprolol tartrate (LOPRESSOR) 25 MG tablet Take 12.5 mg by mouth 2 (two) times daily.   Yes [provider]  Multiple Vitamin (MULTIVITAMIN WITH MINERALS) TABS tablet Take 1 tablet by mouth daily. 10/10/19  Yes Adhikari, Amrit, MD  ondansetron (ZOFRAN-ODT) 4 MG disintegrating tablet Take 4 mg by mouth every 8 (eight) hours as needed for nausea or vomiting.   Yes [provider]  pantoprazole (PROTONIX) 40 MG injection Inject 40 mg into the vein 2 (two) times daily.   Yes [provider]  senna-docusate (SENOKOT-S) 8.6-50 MG tablet Take 1 tablet by mouth 2 (two) times daily. 10/09/19  Yes Adhikari, Amrit, MD  tamsulosin (FLOMAX) 0.4 MG CAPS capsule Take 1 capsule (0.4 mg total) by mouth daily. 10/10/19  Yes Adhikari, Amrit, MD  traMADol (ULTRAM) 50 MG tablet Take 50 mg by mouth daily.   Yes [provider]  Vancomycin HCl 1000 MG/200ML SOLN Inject 1,000 mg into the vein every 18 (eighteen) hours. Infuse over 60 minutes   Yes [provider]  apixaban (ELIQUIS) 5 MG TABS tablet Take 1 tablet (5 mg total) by mouth 2 (two) times daily. Patient not taking: Reported on 11/08/2019 10/09/19   Adhikari, Amrit, MD  feeding supplement, ENSURE ENLIVE, (ENSURE ENLIVE) LIQD Take 237 mLs by mouth 2 (two) times daily between meals. 10/09/19   Adhikari, Amrit, MD  furosemide (LASIX) 20 MG tablet Take 1 tablet (20 mg total) by mouth daily. Patient not taking: Reported on 11/07/2019 10/10/19   Adhikari, Amrit, MD  ipratropium-albuterol (DUONEB) 0.5-2.5 (3) MG/3ML SOLN Take 3 mLs by nebulization every 4 (four) hours as needed. Patient not taking: Reported on 11/03/2019 10/09/19   Adhikari, Amrit, MD  osimertinib mesylate (TAGRISSO) 80 MG tablet Take 1 tablet (80 mg total) by mouth daily. 11/05/19   Mohamed, Mohamed, MD  sodium chloride 1 g tablet Take 2  tablets (2 g total) by mouth 2 (two) times daily with a meal. Patient not taking: Reported on 10/23/2019 10/09/19   Adhikari, Amrit, MD     Critical care time: 32 minutes    Katalina Eubanks, AGACNP-BC [facility]  Pgr: [phone]  PCCM Pgr: [phone]     

## 2019-11-10 NOTE — Progress Notes (Signed)
eLink Physician-Brief Progress Note Patient Name: Emily Kim DOB: 01-Dec-1940 MRN: [id number]   Date of Service  11/10/2019  HPI/Events of Note  Notified of glucose 170s to 180s. On TF 60 cc/hr. Creatinine 3.81  eICU Interventions  Ordered to start low dose SSI     Intervention Category Major Interventions: Hyperglycemia - active titration of insulin therapy  Shona Needles Cigi Bega 11/10/2019, 12:03 AM

## 2019-11-10 NOTE — Progress Notes (Signed)
ANTICOAGULATION CONSULT NOTE - Follow Up Consult  Pharmacy Consult for Lovenox --> heparin drip Indication: pulmonary embolus, upper bilateral extremity DVT  No Known Allergies  Patient Measurements: Height: 5\' 8"  (172.7 cm) Weight: 210 lb 1.6 oz (95.3 kg) IBW/kg (Calculated) : 63.9 Heparin Dosing Weight: 82 kg  Vital Signs: Temp: 99.4 F (37.4 C) (02/22 0800) Temp Source: Axillary (02/22 0800) BP: 116/59 (02/22 0630) Pulse Rate: 112 (02/22 0630)  Labs: Recent Labs    11/08/19 0500 11/08/19 0500 11/09/19 0606 11/09/19 1300 11/10/19 0600  HGB 7.3*   < > 7.0*  --  6.3*  HCT 25.4*  --  23.9*  --  19.8*  PLT 208  --  194  --  142*  HEPARINUNFRC  --   --   --  2.02*  --   HEPRLOWMOCWT  --   --   --   --  1.56  CREATININE 3.04*  --  3.81*  --  4.49*   < > = values in this interval not displayed.    Estimated Creatinine Clearance: 12.5 mL/min (A) (by C-G formula based on SCr of 4.49 mg/dL (H)).   Assessment: Patient's a 79 y.o F with lunch cancer, recent PE and bilateral upper extremity DVT on Eliquis PTA who was transferred from [facility] to [facility] on 2/16 for management of cancer and SOB.  He was transitioned to [place] on 2/19. With worsening renal function, pharmacy was consulted to change to heparin drip on 2/22.  Today, 11/10/2019: - hgb down 6.3, plts down 142 - no bleeding documented - scr trending up 4.49 (crcl~12). Not candidate for dialysis - last dose of lovenox given on 2/21 at ~0840 - LMWH level (anti-Xa level) drawn 21 hrs after last dose = 1.56   Goal of Therapy:  Heparin level 0.3-0.7 units/ml Monitor platelets by anticoagulation protocol: Yes   Plan:  - d/c lovenox - start heparin drip at 1200 units/hr - check 8 hr heparin level - monitor cbc closely and for s/s bleeding  Hazeline Charnley P 11/10/2019,9:12 AM

## 2019-11-11 ENCOUNTER — Inpatient Hospital Stay (HOSPITAL_COMMUNITY): Payer: Medicare Other

## 2019-11-11 DIAGNOSIS — J90 Pleural effusion, not elsewhere classified: Secondary | ICD-10-CM

## 2019-11-11 DIAGNOSIS — Z9911 Dependence on respirator [ventilator] status: Secondary | ICD-10-CM

## 2019-11-11 DIAGNOSIS — I82623 Acute embolism and thrombosis of deep veins of upper extremity, bilateral: Secondary | ICD-10-CM

## 2019-11-11 DIAGNOSIS — C3491 Malignant neoplasm of unspecified part of right bronchus or lung: Secondary | ICD-10-CM

## 2019-11-11 DIAGNOSIS — R579 Shock, unspecified: Secondary | ICD-10-CM | POA: Diagnosis present

## 2019-11-11 DIAGNOSIS — N179 Acute kidney failure, unspecified: Secondary | ICD-10-CM

## 2019-11-11 LAB — BASIC METABOLIC PANEL
Anion gap: 12 (ref 5–15)
BUN: 79 mg/dL — ABNORMAL HIGH (ref 8–23)
CO2: 22 mmol/L (ref 22–32)
Calcium: 8.2 mg/dL — ABNORMAL LOW (ref 8.9–10.3)
Chloride: 101 mmol/L (ref 98–111)
Creatinine, Ser: 4.55 mg/dL — ABNORMAL HIGH (ref 0.44–1.00)
GFR calc Af Amer: 10 mL/min — ABNORMAL LOW (ref >60)
GFR calc non Af Amer: 9 mL/min — ABNORMAL LOW (ref >60)
Glucose, Bld: 139 mg/dL — ABNORMAL HIGH (ref 70–99)
Potassium: 4.2 mmol/L (ref 3.5–5.1)
Sodium: 135 mmol/L (ref 135–145)

## 2019-11-11 LAB — GLUCOSE, CAPILLARY
Glucose-Capillary: 141 mg/dL — ABNORMAL HIGH (ref 70–99)
Glucose-Capillary: 127 mg/dL — ABNORMAL HIGH (ref 70–99)
Glucose-Capillary: 130 mg/dL — ABNORMAL HIGH (ref 70–99)
Glucose-Capillary: 143 mg/dL — ABNORMAL HIGH (ref 70–99)
Glucose-Capillary: 148 mg/dL — ABNORMAL HIGH (ref 70–99)

## 2019-11-11 LAB — CBC
HCT: 22.4 % — ABNORMAL LOW (ref 36.0–46.0)
Hemoglobin: 7.2 g/dL — ABNORMAL LOW (ref 12.0–15.0)
MCH: 26.6 pg (ref 26.0–34.0)
MCHC: 32.1 g/dL (ref 30.0–36.0)
MCV: 82.7 fL (ref 80.0–100.0)
Platelets: UNDETERMINED 10*3/uL (ref 150–400)
RBC: 2.71 MIL/uL — ABNORMAL LOW (ref 3.87–5.11)
RDW: 18.6 % — ABNORMAL HIGH (ref 11.5–15.5)
WBC: 11.2 10*3/uL — ABNORMAL HIGH (ref 4.0–10.5)
nRBC: 0.3 % — ABNORMAL HIGH (ref 0.0–0.2)

## 2019-11-11 LAB — HEPARIN LEVEL (UNFRACTIONATED)
Heparin Unfractionated: 1.26 IU/mL — ABNORMAL HIGH (ref 0.30–0.70)
Heparin Unfractionated: 1.08 IU/mL — ABNORMAL HIGH (ref 0.30–0.70)

## 2019-11-11 LAB — TRIGLYCERIDES: Triglycerides: 78 mg/dL (ref <150)

## 2019-11-11 MED ORDER — HEPARIN (PORCINE) 25000 UT/250ML-% IV SOLN
800.0000 [IU]/h | INTRAVENOUS | Status: DC
  Administered 2019-11-11: 12:00:00 800 [IU]/h via INTRAVENOUS

## 2019-11-11 MED ORDER — HEPARIN (PORCINE) 25000 UT/250ML-% IV SOLN
700.0000 [IU]/h | INTRAVENOUS | Status: DC
  Administered 2019-11-11: 700 [IU]/h via INTRAVENOUS

## 2019-11-11 NOTE — TOC Initial Note (Signed)
Transition of Care [facility]) - Initial/Assessment Note    Patient Details  Name: Emily Kim MRN: [id number] Date of Birth: May 23, 1941  Transition of Care [facility]) CM/SW Contact:    Leeroy Cha, RN Phone Number: 11/11/2019, 9:44 AM  Clinical Narrative:                 [id number] at [phone] per his request.  Wanted to know if I had talked anyone form csw before she was intubated.  No notes in the system, asked if she had signed anything.  The only item are consent forms when she first became a patient here.  States that he wants to get her record from here and [facility].  Told him that he can request them and that [facility] is responsible for their own records.        Patient Goals and CMS Choice        Expected Discharge Plan and Services                                                Prior Living Arrangements/Services                       Activities of Daily Living Home Assistive Devices/Equipment: Cane (specify quad or straight), Nebulizer, Walker (specify type)(single point cane, front wheeled walker) ADL Screening (condition at time of admission) Patient's cognitive ability adequate to safely complete daily activities?: Yes Is the patient deaf or have difficulty hearing?: No Does the patient have difficulty seeing, even when wearing glasses/contacts?: No Does the patient have difficulty concentrating, remembering, or making decisions?: No Patient able to express need for assistance with ADLs?: Yes Does the patient have difficulty dressing or bathing?: Yes Independently performs ADLs?: No Communication: Independent Dressing (OT): Needs assistance Is this a change from baseline?: Change from baseline, expected to last >3 days Grooming: Needs assistance Is this a change from baseline?: Change from baseline, expected to last >3 days Feeding: Needs assistance Is this a change from baseline?: Change from baseline, expected to last >3  days Bathing: Needs assistance Is this a change from baseline?: Change from baseline, expected to last >3 days Toileting: Dependent Is this a change from baseline?: Change from baseline, expected to last >3days In/Out Bed: Dependent Is this a change from baseline?: Change from baseline, expected to last >3 days Walks in Home: Dependent Is this a change from baseline?: Change from baseline, expected to last >3 days Does the patient have difficulty walking or climbing stairs?: Yes(secondary to weakness and shortness of breath) Weakness of Legs: Both Weakness of Arms/Hands: Both  Permission Sought/Granted                  Emotional Assessment              Admission diagnosis:  Acute respiratory failure with hypoxia ([place]) [J96.01] Patient Active Problem List   Diagnosis Date Noted  . Acute respiratory failure with hypoxia ([place]) 11/01/2019  . Acute bilateral deep vein thrombosis (DVT) of upper extremities ([place]) 10/21/2019  . Pressure injury of skin 11/14/2019  . Difficult intravenous access   . Adenocarcinoma of right lung, stage 4 ([place])   . Pulmonary embolism ([place]) 09/23/2019  . 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 Pleural effusion 09/23/2019   PCP:  Patient, No Pcp Per Pharmacy:   [facility] [phone] -  [place] ([place]), [place] [address] [place] [address] Phone: [phone] Fax: [phone]  [place], [place] - [place] [address] Phone: [phone] Fax: [phone]     Social Determinants of Health (SDOH) Interventions    Readmission Risk Interventions No flowsheet data found.

## 2019-11-11 NOTE — Progress Notes (Addendum)
ANTICOAGULATION CONSULT NOTE - Follow Up Consult  Pharmacy Consult for Lovenox --> heparin drip Indication: pulmonary embolus, upper bilateral extremity DVT  No Known Allergies  Patient Measurements: Height: 5\' 8"  (172.7 cm) Weight: 213 lb 6.5 oz (96.8 kg) IBW/kg (Calculated) : 63.9 Heparin Dosing Weight:   Vital Signs: Temp: 97.6 F (36.4 C) (02/23 1942) Temp Source: Axillary (02/23 1942) BP: 102/46 (02/23 2200) Pulse Rate: 103 (02/23 1946)  Labs: Recent Labs    11/09/19 0606 11/09/19 1300 11/10/19 0600 11/10/19 0600 11/10/19 1238 11/10/19 1238 11/10/19 2035 11/10/19 2207 11/11/19 0432 11/11/19 0934 11/11/19 2136  HGB 7.0*  --  6.3*   < > 6.1*   < >  --  7.3* 7.2*  --   --   HCT 23.9*  --  19.8*   < > 19.1*  --   --  23.0* 22.4*  --   --   PLT 194  --  142*  --  127*  --   --   --  PLATELET CLUMPS NOTED ON SMEAR, UNABLE TO ESTIMATE  --   --   HEPARINUNFRC  --    < >  --   --   --   --  1.22*  --   --  1.26* 1.08*  HEPRLOWMOCWT  --   --  1.56  --   --   --   --   --   --   --   --   CREATININE 3.81*  --  4.49*  --   --   --   --   --  4.55*  --   --    < > = values in this interval not displayed.    Estimated Creatinine Clearance: 12.4 mL/min (A) (by C-G formula based on SCr of 4.55 mg/dL (H)).   Medications:  Infusions:  . sodium chloride 10 mL/hr at 11/08/19 0900  . fentaNYL infusion INTRAVENOUS 100 mcg/hr (11/11/19 2000)  . [START ON 11/12/2019] heparin    . norepinephrine (LEVOPHED) Adult infusion 5 mcg/min (11/11/19 2000)  . propofol (DIPRIVAN) infusion 15 mcg/kg/min (11/11/19 2139)    Assessment: Patient with high heparin level.  No heparin issues per RN.  While heparin level has now started to decrease it is still high.  Will hold heparin 43mins (very poor renal function) and resume at lower rate.  Goal of Therapy:  Heparin level 0.3-0.7 units/ml Monitor platelets by anticoagulation protocol: Yes   Plan:  Hold heparin until 0000 2/24 Resume  heparin at 0000 2/24 at 700 units/hr Recheck heparin level at 0900  Nani Skillern Crowford 11/11/2019,10:26 PM

## 2019-11-11 NOTE — Progress Notes (Signed)
ANTICOAGULATION CONSULT NOTE - Follow Up Consult  Pharmacy Consult for Lovenox --> heparin drip Indication: pulmonary embolus, upper bilateral extremity DVT  No Known Allergies  Patient Measurements: Height: 5\' 8"  (172.7 cm) Weight: 213 lb 6.5 oz (96.8 kg) IBW/kg (Calculated) : 63.9 Heparin Dosing Weight: 82 kg  Vital Signs: Temp: 98.6 F (37 C) (02/23 0809) Temp Source: Axillary (02/23 0809) BP: 125/51 (02/23 0738) Pulse Rate: 103 (02/23 0738)  Labs: Recent Labs    11/09/19 0606 11/09/19 0606 11/09/19 1300 11/10/19 0600 11/10/19 0600 11/10/19 1238 11/10/19 1238 11/10/19 2035 11/10/19 2207 11/11/19 0432  HGB 7.0*   < >  --  6.3*   < > 6.1*   < >  --  7.3* 7.2*  HCT 23.9*   < >  --  19.8*   < > 19.1*  --   --  23.0* 22.4*  PLT 194   < >  --  142*  --  127*  --   --   --  PLATELET CLUMPS NOTED ON SMEAR, UNABLE TO ESTIMATE  HEPARINUNFRC  --   --  2.02*  --   --   --   --  1.22*  --   --   HEPRLOWMOCWT  --   --   --  1.56  --   --   --   --   --   --   CREATININE 3.81*  --   --  4.49*  --   --   --   --   --  4.55*   < > = values in this interval not displayed.    Estimated Creatinine Clearance: 12.4 mL/min (A) (by C-G formula based on SCr of 4.55 mg/dL (H)).   Assessment: Patient's a 79 y.o F with lunch cancer, recent PE and bilateral upper extremity DVT on Eliquis PTA who was transferred from [facility] to [facility] on 2/16 for management of cancer and SOB.  He was transitioned to [facility] on 2/19. With worsening renal function, pharmacy was consulted to change to heparin drip on 2/22.  Today, 11/11/2019: - heparin level is supra-therapeutic at 1.26. Per pt's RN, heparin drip is running at 1100 units/hr and level collected at appropriate site. No bleeding noted. Suspects elevated level is likely from accumulation d/t renal insufficiency and possible residual LMWH dose given on 2/21 - hgb low but stable, plts clumped - scr trending up 4.55 (crcl~12). Not candidate for  dialysis - last dose of lovenox given on 2/21 at ~0840  Goal of Therapy:  Heparin level 0.3-0.7 units/ml Monitor platelets by anticoagulation protocol: Yes   Plan:  - hold heparin drip for 1 hour, then resume heparin drip at 800 units/hr - check 8 hr heparin level - monitor cbc closely and for s/s bleeding  Mackenzee Becvar P 11/11/2019,8:19 AM

## 2019-11-11 NOTE — Progress Notes (Signed)
NAME:  Emily Kim, MRN:  [id number], DOB:  06/27/1941, LOS: 7 ADMISSION DATE:  11/02/2019, CONSULTATION DATE:  11/09/2019 REFERRING MD:  Dr. 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  She was started on therapy for pneumonia and UTI, and changed to lovenox.  Noted to have altered mental status after receiving pain medication.  On 11/09/19 developed worsening hypoxia with respiratory leading to cardiac arrest with ROSC after about 10 minutes.  She was intubated and started on pressors.  Past Medical History  Stage IV NSCLC, HTN, GERD  Significant Hospital Events   2/16 Admit 2/21 cardiac arrest  Consults:  Oncology 2/20  Nephrology > not a candidate for HD  Procedures:  Rt femoral CVL 2/16 >> ETT 2/21 >>   Significant Diagnostic Tests:  Renal u/s 2/20 > mild increased Rt renal cortical echogenicity, no hydronephrosis Head CT 2/22  No acute changes Serum cortisol 2/22 = 16   Micro Data:  SARS CoV2 TAT 2/16 > negative MRSA PCR 2/16 > Negative  Sputum 2/21 >> not sent   Antimicrobials:  Vancomycin 2/16 > 2/18 Cefepime 2/16 >> 2/22   Interim history/subjective:  Remains on Vent Support and Levophed. Hemoglobin low this AM at 6.3.   Scheduled Meds: . sodium chloride   Intravenous Once  . chlorhexidine gluconate (MEDLINE KIT)  15 mL Mouth Rinse BID  . Chlorhexidine Gluconate Cloth  6 each Topical Daily  . feeding supplement (PRO-STAT SUGAR FREE 64)  30 mL Per Tube BID  . feeding supplement (VITAL HIGH PROTEIN)  1,000 mL Per Tube Q24H  . insulin aspart  0-9 Units Subcutaneous Q4H  . mouth rinse  15 mL Mouth Rinse 10 times per day  . multivitamin with minerals  1 tablet Per Tube Daily   . osimertinib mesylate  80 mg Oral Daily  . pantoprazole sodium  40 mg Per Tube Daily  . sodium chloride flush  10-40 mL Intracatheter Q12H   Continuous Infusions: . sodium chloride 10 mL/hr at 11/08/19 0900  . fentaNYL infusion INTRAVENOUS 50 mcg/hr (11/11/19 0639)  . heparin 1,100 Units/hr (11/11/19 8182)  . norepinephrine (LEVOPHED) Adult infusion 6 mcg/min (11/11/19 9937)  . propofol (DIPRIVAN) infusion 10 mcg/kg/min (11/11/19 0611)   PRN Meds:.acetaminophen **OR** acetaminophen, albuterol, bisacodyl, fentaNYL, [DISCONTINUED] ondansetron **OR** ondansetron (ZOFRAN) IV, senna-docusate, sodium chloride flush  Objective   Blood pressure (!) 125/51, pulse (!) 103, temperature 98.6 F (37 C), temperature source Axillary, resp. rate (!) 28, height 5' 8"  (1.727 m), weight 96.8 kg, SpO2 98 %.    Vent Mode: PRVC FiO2 (%):  [40 %-60 %] 40 % Set Rate:  [28 bmp] 28 bmp Vt Set:  [510 mL] 510 mL PEEP:  [5 cmH20] 5 cmH20 Plateau Pressure:  [24 cmH20-26 cmH20] 24 cmH20   Intake/Output Summary (Last 24 hours) at 11/11/2019 1105 Last data filed at 11/11/2019 1696 Gross per 24 hour  Intake 1594.34 ml  Output 150 ml  Net 1444.34 ml   Filed Weights   11/09/19 0500 11/10/19 0500 11/11/19 0500  Weight: 94 kg 95.3 kg 96.8 kg    Examination:  Pt sedated on diprovan / not tracking or fc  Oropharynx et Neck supple Lungs with a few scattered  exp > insp rhonchi bilaterally RRR no s3 or or sign murmur Abd soft/limited excursion  Extr warm with 2+ generalized pitting edema  Resolved Hospital Problem list     Assessment & Plan:   Acute on chronic hypoxic/hypercapnic respiratory failure. - likely combination of hypoventilation from opiate medication use, pulmonary edema, aspiration pneumonia, and metastatic lung cancer Bacterial HCAP and aspiration pneumonia. Plan  -[place] as tol  -VAP Bundle  - abx per flow sheet/ now off   Cardiac arrest after respiratory  arrest. Cardiogenic shock. PE with b/l upper extremity DVT. Plan -Cardiac Monitoring  -pressors to keep MAP > 65 (Currently on 8 mcg/min levophed)   -Transitioned to Heparin gtt given kidney function   Acute metabolic encephalopathy 2nd to renal failure, hypoxia, hypercapnia. Plan Head ct 2/22 as above  - q4h Neuro Checks    AKI from ATN likely from vancomycin use and contrast dye. Lab Results  Component Value Date   CREATININE 4.55 (H) 11/11/2019   CREATININE 4.49 (H) 11/10/2019   CREATININE 3.81 (H) 11/09/2019   - baseline creatinine 1.08 from 11/07/2019 Plan - nephrology consulted >> not HD Candidate  -Trend BMP - Replace electrolytes as indicated   Anemia of critical illness and chronic disease.   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 Right Malignant Pleural Effusion  Plan - Oncology Following  - s/p palliative radiotherapy to suprahilar mass - started osimertinib (tagrisso) on 2/17 - drain pleurex catheter intermittently > 500 cc 2/20   Pressure injuries. Plan - Stage 2 sacrum, present on admission - Stage 2 Rt elbow, present on admission  Difficulty IV access. Plan - related to SVC syndrome - keep femoral CVL in place  Goals of care. -Family Conversation 2/21 Limited Code. Wants a couple of days before deciding on comfort measures   Best practice:  Diet: tube feeds DVT prophylaxis: Heparin  GI prophylaxis: protonix Mobility: bed rest Code Status: Limited Code  Disposition: ICU  Labs   CBC: Recent Labs  Lab 11/08/19 0500 11/08/19 0500 11/09/19 0606 11/10/19 0600 11/10/19 1238 11/10/19 2207 11/11/19 0432  WBC 7.9  --  9.1 10.0  10.3  --  11.2*  HGB 7.3*   < > 7.0* 6.3* 6.1* 7.3* 7.2*  HCT 25.4*   < > 23.9* 19.8* 19.1* 23.0* 22.4*  MCV 91.4  --  89.2 83.2 82.7  --  82.7  PLT 208  --  194 142* 127*  --  PLATELET CLUMPS NOTED ON SMEAR, UNABLE TO ESTIMATE   < > = values in this interval not displayed.    Basic Metabolic Panel: Recent Labs  Lab 11/07/19 0500 11/08/19 0500 11/09/19 0606 11/09/19 1700 11/10/19 0600 11/10/19 2035 11/11/19 0432  NA 135 134* 135  --  135  --  135  K 4.1 4.8 4.9  --  4.4  --  4.2  CL 97* 98 99  --  100  --  101  CO2 28 27 22   --  24  --  22  GLUCOSE 113* 131* 140*  --  167*  --  139*  BUN 34* 44* 51*  --  64*  --  79*  CREATININE 2.44* 3.04* 3.81*  --  4.49*  --  4.55*  CALCIUM 8.7* 8.8* 9.3  --  8.4*  --  8.2*  MG 1.9  --  2.1 2.0 2.1 2.0  --   PHOS  --   --  3.7 1.9* 1.4* 2.4*  --    GFR: Estimated Creatinine Clearance: 12.4 mL/min (A) (by C-G formula based on SCr of 4.55 mg/dL (H)). Recent Labs  Lab 11/09/19 0606 11/10/19 0600 11/10/19 1238 11/11/19 0432  WBC 9.1 10.0 10.3 11.2*    Liver Function Tests: Recent Labs  Lab 11/05/19 0300 11/06/19 0327 11/07/19 0500 11/08/19 0500 11/10/19 0600  AST 32 37 38 34 193*  ALT 16 20 21 22  116*  ALKPHOS 203* 202* 213* 221* 179*  BILITOT 0.9 0.7 0.7 0.4 0.8  PROT 5.6* 5.7* 6.0* 6.2* 5.3*  ALBUMIN 2.1* 2.0* 2.1* 2.2* 1.8*   No results for input(s): LIPASE, AMYLASE in the last 168 hours. No results for input(s): AMMONIA in the last 168 hours.  ABG    Component Value Date/Time   PHART 7.241 (L) 11/09/2019 0340   PCO2ART 59.4 (H) 11/09/2019 0340   PO2ART 68.3 (L) 11/09/2019 0340   HCO3 25.0 11/09/2019 0340   ACIDBASEDEF 2.4 (H) 11/09/2019 0340   O2SAT 93.1 11/09/2019 0340     Coagulation Profile: No results for input(s): INR, PROTIME in the last 168 hours.  Cardiac Enzymes: No results for input(s): CKTOTAL, CKMB, CKMBINDEX, TROPONINI in the last 168 hours.  HbA1C: Hgb A1c MFr Bld  Date/Time Value Ref Range  Status  11/10/2019 06:00 AM 5.4 4.8 - 5.6 % Final    Comment:    (NOTE) Pre diabetes:          5.7%-6.4% Diabetes:              >6.4% Glycemic control for   <7.0% adults with diabetes     CBG: Recent Labs  Lab 11/10/19 1655 11/10/19 1950 11/10/19 2335 11/11/19 0416 11/11/19 0754  GLUCAP 136* 142* 136* 141* 127*      Clearly the high likelihood of prolonging suffering from pulmonary interventions vastly outweighs any reasonable chance of benefit from offering anything else because medical science has done all it can here to restore health.  Therefore  I don't have any additional recs  except to consider hospice sooner rather than later - paradoxically many patients with respiratory diseases live longer and better once a palliative approach is used in this setting.     The patient is critically ill with multiple organ systems failure and requires high complexity decision making for assessment and support, frequent evaluation and titration of therapies, application of advanced monitoring technologies and extensive interpretation of multiple databases. Critical Care Time devoted to patient care services described in this note is 35 minutes.    Christinia Gully, MD Pulmonary and [place] [phone] After 5:30 PM or weekends, use Beeper [phone]

## 2019-11-12 DIAGNOSIS — Z7189 Other specified counseling: Secondary | ICD-10-CM

## 2019-11-12 DIAGNOSIS — R579 Shock, unspecified: Secondary | ICD-10-CM

## 2019-11-12 LAB — CBC
HCT: 19.9 % — ABNORMAL LOW (ref 36.0–46.0)
Hemoglobin: 6.5 g/dL — CL (ref 12.0–15.0)
MCH: 27 pg (ref 26.0–34.0)
MCHC: 32.7 g/dL (ref 30.0–36.0)
MCV: 82.6 fL (ref 80.0–100.0)
Platelets: UNDETERMINED 10*3/uL (ref 150–400)
RBC: 2.41 MIL/uL — ABNORMAL LOW (ref 3.87–5.11)
RDW: 19 % — ABNORMAL HIGH (ref 11.5–15.5)
WBC: 9.2 10*3/uL (ref 4.0–10.5)
nRBC: 0.7 % — ABNORMAL HIGH (ref 0.0–0.2)

## 2019-11-12 LAB — GLUCOSE, CAPILLARY
Glucose-Capillary: 145 mg/dL — ABNORMAL HIGH (ref 70–99)
Glucose-Capillary: 146 mg/dL — ABNORMAL HIGH (ref 70–99)
Glucose-Capillary: 137 mg/dL — ABNORMAL HIGH (ref 70–99)
Glucose-Capillary: 133 mg/dL — ABNORMAL HIGH (ref 70–99)

## 2019-11-12 LAB — BASIC METABOLIC PANEL
Anion gap: 11 (ref 5–15)
BUN: 88 mg/dL — ABNORMAL HIGH (ref 8–23)
CO2: 23 mmol/L (ref 22–32)
Calcium: 8.2 mg/dL — ABNORMAL LOW (ref 8.9–10.3)
Chloride: 99 mmol/L (ref 98–111)
Creatinine, Ser: 5.07 mg/dL — ABNORMAL HIGH (ref 0.44–1.00)
GFR calc Af Amer: 9 mL/min — ABNORMAL LOW (ref >60)
GFR calc non Af Amer: 8 mL/min — ABNORMAL LOW (ref >60)
Glucose, Bld: 140 mg/dL — ABNORMAL HIGH (ref 70–99)
Potassium: 4.5 mmol/L (ref 3.5–5.1)
Sodium: 133 mmol/L — ABNORMAL LOW (ref 135–145)

## 2019-11-12 LAB — PREPARE RBC (CROSSMATCH)

## 2019-11-12 LAB — TRIGLYCERIDES: Triglycerides: 95 mg/dL (ref <150)

## 2019-11-12 MED ORDER — SODIUM CHLORIDE 0.9% IV SOLUTION: Freq: Once | INTRAVENOUS | Status: DC

## 2019-11-12 NOTE — Progress Notes (Signed)
CRITICAL VALUE ALERT  Critical Value:  Hgb 6.5  Date & Time Notied:  11/12/19 0530  Provider Notified: [email or web address]-link  Orders Received/Actions taken: 1 unit of blood

## 2019-11-12 NOTE — Progress Notes (Signed)
NAME:  Emily Kim, MRN:  [id number], DOB:  November 29, 1940, LOS: 8 ADMISSION DATE:  11/16/2019, CONSULTATION DATE:  11/09/2019 REFERRING MD:  Dr. 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  She was started on therapy for pneumonia and UTI, and changed to lovenox.  Noted to have altered mental status after receiving pain medication.  On 11/09/19 developed worsening hypoxia with respiratory leading to cardiac arrest with ROSC after about 10 minutes.  She was intubated and started on pressors.  Past Medical History  Stage IV NSCLC, HTN, GERD  Significant Hospital Events   2/16 Admit 2/21 cardiac arrest  Consults:  Oncology 2/20  Nephrology > not a candidate for HD  Procedures:  Rt femoral CVL 2/16 >> ETT 2/21 >>   Significant Diagnostic Tests:  Renal u/s 2/20 > mild increased Rt renal cortical echogenicity, no hydronephrosis Head CT 2/22  No acute changes Serum cortisol 2/22 = 16   Micro Data:  SARS CoV2 TAT 2/16 > negative MRSA PCR 2/16 > Negative  Sputum 2/21 >> not sent   Antimicrobials:  Vancomycin 2/16 > 2/18 Cefepime 2/16 >> 2/22   Interim history/subjective:  Received 1 PRBC for Hgb 6.5 Remains on NE, remains intubated and sedated  Bloody pulmonary secretions  Quickly failed SBT due to apnea   Scheduled Meds: . sodium chloride   Intravenous Once  . sodium chloride   Intravenous Once  . chlorhexidine gluconate (MEDLINE KIT)  15 mL Mouth Rinse BID  . Chlorhexidine Gluconate Cloth  6 each Topical Daily  . feeding supplement (PRO-STAT SUGAR FREE 64)  30 mL Per Tube BID  . feeding supplement (VITAL HIGH PROTEIN)  1,000 mL Per Tube Q24H  . insulin aspart  0-9 Units Subcutaneous Q4H   . mouth rinse  15 mL Mouth Rinse 10 times per day  . multivitamin with minerals  1 tablet Per Tube Daily  . osimertinib mesylate  80 mg Oral Daily  . pantoprazole sodium  40 mg Per Tube Daily  . sodium chloride flush  10-40 mL Intracatheter Q12H   Continuous Infusions: . sodium chloride 10 mL/hr at 11/08/19 0900  . fentaNYL infusion INTRAVENOUS 125 mcg/hr (11/12/19 0739)  . heparin 700 Units/hr (11/12/19 0739)  . norepinephrine (LEVOPHED) Adult infusion 6 mcg/min (11/12/19 0739)  . propofol (DIPRIVAN) infusion 15 mcg/kg/min (11/12/19 0739)   PRN Meds:.acetaminophen **OR** acetaminophen, albuterol, bisacodyl, fentaNYL, [DISCONTINUED] ondansetron **OR** ondansetron (ZOFRAN) IV, senna-docusate, sodium chloride flush  Objective   Blood pressure (!) 104/45, pulse 99, temperature 98.5 F (36.9 C), temperature source Oral, resp. rate 19, height 5' 8" (1.727 m), weight 102.5 kg, SpO2 100 %.    Vent Mode: PRVC FiO2 (%):  [40 %] 40 % Set Rate:  [28 bmp] 28 bmp Vt Set:  [510 mL] 510 mL PEEP:  [5 cmH20] 5 cmH20 Plateau Pressure:  [25 WLN98-92 cmH20] 28 cmH20   Intake/Output Summary (Last 24 hours) at 11/12/2019 0923 Last data filed at 11/12/2019 0909 Gross per 24 hour  Intake 2423.07 ml  Output 65 ml  Net 2358.07 ml   Filed Weights   11/10/19 0500 11/11/19 0500 11/12/19 0411  Weight: 95.3 kg 96.8 kg 102.5 kg    Examination:  General: Chronically  ill, overweight older adult F, intubated and sedated NAD  HEENT: NCAT. Hair loss on top on head. Anicteric sclera. ETT OGT secure  Pulmonary: Few scattered rhonchi bilaterally. Symmetrical chest expansion. PleurX catheter Cardiovascular: RRR s1s2 no rgm Cap refill < 3 seconds  GI: abdomen is soft round ndnt, + bowel sounds  Extremities: BUE 3+ pitting edema. BLE trace non-pitting edema.  No obvious joint deformity. No cyanosis or clubbing  Skin: c/d/w without rash   Resolved Hospital Problem list     Assessment & Plan:   Goals of  care. -Thoughtful meeting with Dr. Mannam and family 2/21-- partial code and family requesting to discuss goals of caire amongst selves -2/24- I spoke with son RE goals of care-- he wishes for transition to comfort care 2/25 and will be in communication 2/24 after deciding on a time when family can come together 2/25.  -We discussed dc labs, imaging, heparin today in anticipation of comfort care tomorrow  Acute on chronic hypoxic/hypercapnic respiratory failure. - likely combination of hypoventilation from opiate medication use, pulmonary edema, aspiration pneumonia, and metastatic lung cancer Bacterial HCAP and aspiration pneumonia. Plan  -Vent Support   -VAP Bundle  Cardiac arrest after respiratory arrest. Cardiogenic shock. PE with b/l upper extremity DVT.  --has been on heparin gtt per pharmacy dosing (renal function precludes use of DOAC).  -Patient required repeated PRBC transfusions and is now with increasing amount of bloody pulm secretions.  Plan -ICU Monitoring -NE for MAP goal > 65  -Heparin is not adding benefit at this time, and we will discontinue 2/24 as patient is planned to transition to comfort care 2/25.  Acute metabolic encephalopathy 2nd to renal failure, hypoxia, hypercapnia. Plan Head ct 2/22 as above  P -Would focus on titrating sedation for comfort, without focus on weaning at this time -Plan for compassionate extubation 2/25  AKI from ATN likely from vancomycin use and contrast dye.  - baseline creatinine 1.08 from 10/21/2019 Plan - nephrology consulted >> not HD Candidate  -no further trending of renal indices; will transition to comfort care 2/25 and following these labs provides no benefit at this time  Anemia of critical illness and chronic disease. -1 PRBC 2/24 overnight for hgb 6.5 Plan - Ok for current PRBC to complete but will not continue to transfuse thereafter.  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 Right Malignant Pleural Effusion  Plan - Oncology Following  - s/p palliative radiotherapy to suprahilar mass - drain pleurex catheter intermittently, will plan to drain 2/24  - started osimertinib (tagrisso) on 2/17; will discontinue 2/24 in anticipation of comfort care 2/25   Pressure injuries. Plan - Stage 2 sacrum, present on admission - Stage 2 Rt elbow, present on admission  Difficulty IV access. Plan - related to SVC syndrome - keep femoral CVL in place   Best practice:  Diet: tube feeds DVT prophylaxis: discontinuing heparin gtt 2/24 for comfort care 2/25  GI prophylaxis: protonix Mobility: bed rest Code Status: Limited Code --Family understands that patient will be DNR not limited code upon compassionate extubation/comfort care 2/25  Disposition: ICU  Labs   CBC: Recent Labs  Lab 11/09/19 0606 11/09/19 0606 11/10/19 0600 11/10/19 1238 11/10/19 2207 11/11/19 0432 11/12/19 0334  WBC 9.1  --  10.0 10.3  --  11.2* 9.2  HGB 7.0*   < > 6.3* 6.1* 7.3* 7.2* 6.5*  HCT 23.9*   < >   19.8* 19.1* 23.0* 22.4* 19.9*  MCV 89.2  --  83.2 82.7  --  82.7 82.6  PLT 194  --  142* 127*  --  PLATELET CLUMPS NOTED ON SMEAR, UNABLE TO ESTIMATE PLATELET CLUMPS NOTED ON SMEAR, UNABLE TO ESTIMATE   < > = values in this interval not displayed.    Basic Metabolic Panel: Recent Labs  Lab 11/07/19 0500 11/07/19 0500 11/08/19 0500 11/09/19 0606 11/09/19 1700 11/10/19 0600 11/10/19 2035 11/11/19 0432 11/12/19 0334  NA 135   < > 134* 135  --  135  --  135 133*  K 4.1   < > 4.8 4.9  --  4.4  --  4.2 4.5  CL 97*   < > 98 99  --  100  --  101 99  CO2 28   < > 27 22  --  24  --  22 23  GLUCOSE 113*   < > 131* 140*  --  167*  --  139* 140*  BUN 34*   < > 44* 51*  --  64*  --  79* 88*  CREATININE 2.44*   < > 3.04* 3.81*  --  4.49*  --  4.55* 5.07*  CALCIUM 8.7*   < > 8.8* 9.3  --  8.4*  --   8.2* 8.2*  MG 1.9  --   --  2.1 2.0 2.1 2.0  --   --   PHOS  --   --   --  3.7 1.9* 1.4* 2.4*  --   --    < > = values in this interval not displayed.   GFR: Estimated Creatinine Clearance: 11.4 mL/min (A) (by C-G formula based on SCr of 5.07 mg/dL (H)). Recent Labs  Lab 11/10/19 0600 11/10/19 1238 11/11/19 0432 11/12/19 0334  WBC 10.0 10.3 11.2* 9.2    Liver Function Tests: Recent Labs  Lab 11/06/19 0327 11/07/19 0500 11/08/19 0500 11/10/19 0600  AST 37 38 34 193*  ALT 20 21 22 116*  ALKPHOS 202* 213* 221* 179*  BILITOT 0.7 0.7 0.4 0.8  PROT 5.7* 6.0* 6.2* 5.3*  ALBUMIN 2.0* 2.1* 2.2* 1.8*   No results for input(s): LIPASE, AMYLASE in the last 168 hours. No results for input(s): AMMONIA in the last 168 hours.  ABG    Component Value Date/Time   PHART 7.241 (L) 11/09/2019 0340   PCO2ART 59.4 (H) 11/09/2019 0340   PO2ART 68.3 (L) 11/09/2019 0340   HCO3 25.0 11/09/2019 0340   ACIDBASEDEF 2.4 (H) 11/09/2019 0340   O2SAT 93.1 11/09/2019 0340     Coagulation Profile: No results for input(s): INR, PROTIME in the last 168 hours.  Cardiac Enzymes: No results for input(s): CKTOTAL, CKMB, CKMBINDEX, TROPONINI in the last 168 hours.  HbA1C: Hgb A1c MFr Bld  Date/Time Value Ref Range Status  11/10/2019 06:00 AM 5.4 4.8 - 5.6 % Final    Comment:    (NOTE) Pre diabetes:          5.7%-6.4% Diabetes:              >6.4% Glycemic control for   <7.0% adults with diabetes     CBG: Recent Labs  Lab 11/11/19 1125 11/11/19 1606 11/11/19 1939 11/11/19 2310 11/12/19 0355  GLUCAP 130* 143* 148* 145* 146*      CRITICAL CARE Performed by: Grace E Bowser   Total critical care time: 40 minutes  Critical care time was exclusive of separately billable procedures and treating other   patients. Critical care was necessary to treat or prevent imminent or life-threatening deterioration.  Critical care was time spent personally by me on the following activities:  development of treatment plan with patient and/or surrogate as well as nursing, discussions with consultants, evaluation of patient's response to treatment, examination of patient, obtaining history from patient or surrogate, ordering and performing treatments and interventions, ordering and review of laboratory studies, ordering and review of radiographic studies, pulse oximetry and re-evaluation of patient's condition.  Grace Bowser MSN, AGACNP-BC [place] [facility]/Critical Care Medicine [phone] If no answer, [phone] 11/12/2019, 10:18 AM    

## 2019-11-12 NOTE — Progress Notes (Signed)
eLink Physician-Brief Progress Note Patient Name: Emily Kim DOB: October 31, 1940 MRN: [id number]   Date of Service  11/12/2019  HPI/Events of Note  Anemia - Hgb = 6.5.   eICU Interventions  Will transfuse 1 unit PRBC.     Intervention Category Major Interventions: Other:  Lysle Dingwall 11/12/2019, 5:43 AM

## 2019-11-13 DIAGNOSIS — R570 Cardiogenic shock: Secondary | ICD-10-CM

## 2019-11-13 LAB — BPAM RBC
Blood Product Expiration Date: 202103272359
Blood Product Expiration Date: 202103272359
ISSUE DATE / TIME: 202102221730
ISSUE DATE / TIME: 202102240903
Unit Type and Rh: 7300
Unit Type and Rh: 7300

## 2019-11-13 LAB — TYPE AND SCREEN
ABO/RH(D): B POS
Antibody Screen: NEGATIVE
Unit division: 0
Unit division: 0

## 2019-11-13 MED ORDER — MIDAZOLAM 50MG/50ML (1MG/ML) PREMIX INFUSION
0.0000 mg/h | INTRAVENOUS | Status: DC
  Administered 2019-11-13: 14:00:00 1 mg/h via INTRAVENOUS
  Filled 2019-11-13: qty 50

## 2019-11-13 MED ORDER — POLYVINYL ALCOHOL 1.4 % OP SOLN
1.0000 [drp] | Freq: Four times a day (QID) | OPHTHALMIC | Status: DC | PRN
  Filled 2019-11-13: qty 15

## 2019-11-13 MED ORDER — DIPHENHYDRAMINE HCL 50 MG/ML IJ SOLN: 25.0000 mg | INTRAMUSCULAR | Status: DC | PRN

## 2019-11-13 MED ORDER — ACETAMINOPHEN 650 MG RE SUPP: 650.0000 mg | Freq: Four times a day (QID) | RECTAL | Status: DC | PRN

## 2019-11-13 MED ORDER — MIDAZOLAM BOLUS VIA INFUSION (WITHDRAWAL LIFE SUSTAINING TX)
2.0000 mg | INTRAVENOUS | Status: DC | PRN
  Administered 2019-11-13: 14:00:00 2 mg via INTRAVENOUS
  Filled 2019-11-13: qty 2

## 2019-11-13 MED ORDER — GLYCOPYRROLATE 0.2 MG/ML IJ SOLN: 0.2000 mg | INTRAMUSCULAR | Status: DC | PRN

## 2019-11-13 MED ORDER — MIDAZOLAM HCL 2 MG/2ML IJ SOLN: 1.0000 mg | INTRAMUSCULAR | Status: DC | PRN

## 2019-11-13 MED ORDER — ACETAMINOPHEN 325 MG PO TABS: 650.0000 mg | ORAL_TABLET | Freq: Four times a day (QID) | ORAL | Status: DC | PRN

## 2019-11-13 MED ORDER — GLYCOPYRROLATE 1 MG PO TABS: 1.0000 mg | ORAL_TABLET | ORAL | Status: DC | PRN

## 2019-11-17 NOTE — Progress Notes (Signed)
NAME:  Emily Kim, MRN:  [id number], DOB:  1940/12/01, LOS: 9 ADMISSION DATE:  11/07/2019, CONSULTATION DATE:  11/09/2019 REFERRING MD:  Dr. 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  She was started on therapy for pneumonia and UTI, and changed to lovenox.  Noted to have altered mental status after receiving pain medication.  On 11/09/19 developed worsening hypoxia with respiratory leading to cardiac arrest with ROSC after about 10 minutes.  She was intubated and started on pressors.  Past Medical History  Stage IV NSCLC, HTN, GERD  Significant Hospital Events   2/16 Admit 2/21 cardiac arrest  Consults:  Oncology 2/20  Nephrology > not a candidate for HD  Procedures:  Rt femoral CVL 2/16 >> ETT 2/21 >>   Significant Diagnostic Tests:  Renal u/s 2/20 > mild increased Rt renal cortical echogenicity, no hydronephrosis Head CT 2/22  No acute changes Serum cortisol 2/22 = 16   Micro Data:  SARS CoV2 TAT 2/16 > negative MRSA PCR 2/16 > Negative  Sputum 2/21 >> not sent   Antimicrobials:  Vancomycin 2/16 > 2/18 Cefepime 2/16 >> 2/22   Interim history/subjective:  Received 1 PRBC for Hgb 6.5 Remains on NE, remains intubated and sedated  Bloody pulmonary secretions  Quickly failed SBT due to apnea   Scheduled Meds: . sodium chloride   Intravenous Once  . sodium chloride   Intravenous Once  . chlorhexidine gluconate (MEDLINE KIT)  15 mL Mouth Rinse BID  . Chlorhexidine Gluconate Cloth  6 each Topical Daily  . feeding supplement (PRO-STAT SUGAR FREE 64)  30 mL Per Tube BID  . feeding supplement (VITAL HIGH PROTEIN)  1,000 mL Per Tube Q24H  . mouth rinse  15 mL Mouth Rinse 10 times  per day  . multivitamin with minerals  1 tablet Per Tube Daily  . pantoprazole sodium  40 mg Per Tube Daily  . sodium chloride flush  10-40 mL Intracatheter Q12H   Continuous Infusions: . sodium chloride 10 mL/hr at 11/08/19 0900  . fentaNYL infusion INTRAVENOUS 150 mcg/hr (29-Nov-2019 0900)  . norepinephrine (LEVOPHED) Adult infusion 2 mcg/min (2019-11-29 0900)  . propofol (DIPRIVAN) infusion 30 mcg/kg/min (Nov 29, 2019 0913)   PRN Meds:.acetaminophen **OR** acetaminophen, albuterol, bisacodyl, fentaNYL, [DISCONTINUED] ondansetron **OR** ondansetron (ZOFRAN) IV, senna-docusate, sodium chloride flush  Objective   Blood pressure (!) 104/50, pulse 99, temperature 98.3 F (36.8 C), temperature source Axillary, resp. rate (!) 24, height 5' 8"  (1.727 m), weight 102.5 kg, SpO2 97 %.    Vent Mode: PRVC FiO2 (%):  [30 %-40 %] 30 % Set Rate:  [28 bmp] 28 bmp Vt Set:  [510 mL-540 mL] 510 mL PEEP:  [5 cmH20] 5 cmH20 Plateau Pressure:  [24 cmH20-27 cmH20] 27 cmH20   Intake/Output Summary (Last 24 hours) at 11/29/2019 0954 Last data filed at 2019-11-29 0900 Gross per 24 hour  Intake 2701.46 ml  Output 45 ml  Net 2656.46 ml   Filed Weights   11/10/19 0500 11/11/19 0500 11/12/19 0411  Weight: 95.3 kg 96.8 kg 102.5 kg    Examination:  General: Chronically ill, overweight older adult F, intubated and sedated NAD  HEENT: NCAT. Hair loss on top on head. Anicteric sclera. ETT OGT  secure  Pulmonary: Few scattered rhonchi bilaterally. Symmetrical chest expansion. PleurX catheter Cardiovascular: RRR s1s2 no rgm Cap refill < 3 seconds  GI: abdomen is soft round ndnt, + bowel sounds  Extremities: BUE 3+ pitting edema. BLE trace non-pitting edema.  No obvious joint deformity. No cyanosis or clubbing  Skin: c/d/w without rash   Resolved Hospital Problem list     Assessment & Plan:   Goals of care. -Family plans to arrive for transition to comfort care 2/25 at approximately 1300 -Will notify spiritual  care -Fentanyl, versed, glyco  -Compassionate extubation and liberation from ventilator   Acute on chronic hypoxic/hypercapnic respiratory failure. - likely combination of hypoventilation from opiate medication use, pulmonary edema, aspiration pneumonia, and metastatic lung cancer Bacterial HCAP and aspiration pneumonia. Plan  -plan for compassionate extubation 2/25, comfort care -will have analgesia and anxiolytic available as well as anticholinergic for secretions   Cardiac arrest after respiratory arrest. Cardiogenic shock. PE with b/l upper extremity DVT.  --has been on heparin gtt per pharmacy dosing (renal function precludes use of DOAC).  -Patient required repeated PRBC transfusions and is now with increasing amount of bloody pulm secretions.  Plan -off heparin, transitioning to comfort care 2/25 -remains on NE until transition to comfort care   Acute metabolic encephalopathy 2nd to renal failure, hypoxia, hypercapnia. Plan Head ct 2/22 as above  P -transitioning to comfort care 2/25 -ok to titrate medications for comfort and cease efforts at correcting encephalopathy   AKI from ATN likely from vancomycin use and contrast dye.  - baseline creatinine 1.08 from 11/06/2019 - nephrology consulted >> not HD Candidate  Plan -no further labs or monitoring -ok to retain foley in setting of end of life, comfort care -transitioning to comfort care 2/25   Anemia of critical illness and chronic disease. -1 PRBC 2/24 overnight for hgb 6.5 Plan - no further labs or transfusions. 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 Right Malignant Pleural Effusion  - Oncology Following  - s/p palliative radiotherapy to suprahilar mass -s/p pleurex placement  Plan -transitioning to comfort care 2/25 -no need for further  pleurex drainage  -no further chemo administration   Pressure injuries. - Stage 2 sacrum, present on admission - Stage 2 Rt elbow, present on admission Plan -maintain comfort   Difficulty IV access. Plan - related to SVC syndrome - keep femoral CVL in place   Best practice:  Diet: tube feeds DVT prophylaxis: n/a: transitioning to comfort care 2/25  GI prophylaxis: na in anticipation of comfort care 2/25  Mobility: bed rest Code Status: Limited code: Full DNR when transitioned to comfort care 2/25   Disposition: ICU  Labs   CBC: Recent Labs  Lab 11/09/19 0606 11/09/19 0606 11/10/19 0600 11/10/19 1238 11/10/19 2207 11/11/19 0432 11/12/19 0334  WBC 9.1  --  10.0 10.3  --  11.2* 9.2  HGB 7.0*   < > 6.3* 6.1* 7.3* 7.2* 6.5*  HCT 23.9*   < > 19.8* 19.1* 23.0* 22.4* 19.9*  MCV 89.2  --  83.2 82.7  --  82.7 82.6  PLT 194  --  142* 127*  --  PLATELET CLUMPS NOTED ON SMEAR, UNABLE TO ESTIMATE PLATELET CLUMPS NOTED ON SMEAR, UNABLE TO ESTIMATE   < > = values in this interval not displayed.    Basic Metabolic Panel: Recent Labs  Lab 11/07/19  0500 11/07/19 0500 11/08/19 0500 11/09/19 0606 11/09/19 1700 11/10/19 0600 11/10/19 2035 11/11/19 0432 11/12/19 0334  NA 135   < > 134* 135  --  135  --  135 133*  K 4.1   < > 4.8 4.9  --  4.4  --  4.2 4.5  CL 97*   < > 98 99  --  100  --  101 99  CO2 28   < > 27 22  --  24  --  22 23  GLUCOSE 113*   < > 131* 140*  --  167*  --  139* 140*  BUN 34*   < > 44* 51*  --  64*  --  79* 88*  CREATININE 2.44*   < > 3.04* 3.81*  --  4.49*  --  4.55* 5.07*  CALCIUM 8.7*   < > 8.8* 9.3  --  8.4*  --  8.2* 8.2*  MG 1.9  --   --  2.1 2.0 2.1 2.0  --   --   PHOS  --   --   --  3.7 1.9* 1.4* 2.4*  --   --    < > = values in this interval not displayed.   GFR: Estimated Creatinine Clearance: 11.4 mL/min (A) (by C-G formula based on SCr of 5.07 mg/dL (H)). Recent Labs  Lab 11/10/19 0600 11/10/19 1238 11/11/19 0432 11/12/19 0334  WBC  10.0 10.3 11.2* 9.2    Liver Function Tests: Recent Labs  Lab 11/07/19 0500 11/08/19 0500 11/10/19 0600  AST 38 34 193*  ALT 21 22 116*  ALKPHOS 213* 221* 179*  BILITOT 0.7 0.4 0.8  PROT 6.0* 6.2* 5.3*  ALBUMIN 2.1* 2.2* 1.8*   No results for input(s): LIPASE, AMYLASE in the last 168 hours. No results for input(s): AMMONIA in the last 168 hours.  ABG    Component Value Date/Time   PHART 7.241 (L) 11/09/2019 0340   PCO2ART 59.4 (H) 11/09/2019 0340   PO2ART 68.3 (L) 11/09/2019 0340   HCO3 25.0 11/09/2019 0340   ACIDBASEDEF 2.4 (H) 11/09/2019 0340   O2SAT 93.1 11/09/2019 0340     Coagulation Profile: No results for input(s): INR, PROTIME in the last 168 hours.  Cardiac Enzymes: No results for input(s): CKTOTAL, CKMB, CKMBINDEX, TROPONINI in the last 168 hours.  HbA1C: Hgb A1c MFr Bld  Date/Time Value Ref Range Status  11/10/2019 06:00 AM 5.4 4.8 - 5.6 % Final    Comment:    (NOTE) Pre diabetes:          5.7%-6.4% Diabetes:              >6.4% Glycemic control for   <7.0% adults with diabetes     CBG: Recent Labs  Lab 11/11/19 1939 11/11/19 2310 11/12/19 0355 11/12/19 0753 11/12/19 1207  GLUCAP 148* 145* 146* 137* 133*      CRITICAL CARE Performed by: Cristal Generous   Total critical care time: 25 minutes  Critical care time was exclusive of separately billable procedures and treating other patients. Critical care was necessary to treat or prevent imminent or life-threatening deterioration.  Critical care was time spent personally by me on the following activities: development of treatment plan with patient and/or surrogate as well as nursing, discussions with consultants, evaluation of patient's response to treatment, examination of patient, obtaining history from patient or surrogate, ordering and performing treatments and interventions, ordering and review of laboratory studies, ordering and review of radiographic studies, pulse  oximetry and  re-evaluation of patient's condition.

## 2019-11-17 NOTE — Procedures (Signed)
Extubation Procedure Note  Patient Details:   Name: Emily Kim DOB: 06-27-1941 MRN: [id number]   Airway Documentation:    Vent end date: 12/05/19 Vent end time: 1410   Evaluation  O2 sats: stable throughout Complications: No apparent complications Patient did tolerate procedure well. Bilateral Breath Sounds: Clear, Diminished   No   Pt is and one-way extubation  Johnette Abraham 12/05/2019, 2:11 PM

## 2019-11-17 NOTE — Progress Notes (Signed)
Pt compassionately extubated at 1410. Versed and fentanyl drip active for comfort. Pt asystole at 1453. Pronounced by Polly Cobia, RN and Gloris Ham, RN. Family present at bedside. Per Vevelyn Royals at [place] [facility] pt not a donor candidate. No belongings present at bedside to return to family. Funeral home information gathered and placed in chart.

## 2019-11-17 NOTE — Progress Notes (Signed)
   November 18, 2019 1400  Clinical Encounter Type  Visited With Family  Visit Type Initial;Psychological support;Spiritual support;Critical Care  Referral From Nurse  Consult/Referral To Chaplain  Spiritual Encounters  Spiritual Needs Prayer;Emotional;Other (Comment);Grief support (Spiritual Care Conversation/Support)  Stress Factors  Patient Stress Factors Not reviewed  Family Stress Factors Loss   I visited with Maragret's daughter, son and another relative per spiritual care consult. I provided Kamilah's son with a prayer shawl. He requested that I pray over his mother, and continue to pray for the family. I provided grief support for the family. Shakeela's son said that his mother "is right with God and that this makes it easier."   Please, contact Spiritual Care for further assistance.   Chaplain Shanon Ace M.Div., [facility]

## 2019-11-17 NOTE — ED Notes (Signed)
I was called to evaluate the patient due to cardiac arrest On my arrival to room CPR was in progress. Patient was intubated by CRNA After multiple rounds of CPR including bicarb and calcium, patient did regain pulses. Seen in conjunction with Dr. Legrand Pitts, hospitalist, who will take over care of patient   CPR Procedure Note I PERSONALLY DIRECTED ANCILLARY STAFF OR/PERFORMED CPR IN AN EFFORT TO REGAIN RETURN OF SPONTANEOUS CIRCULATION IN AN EFFORT TO MAINTAIN NEURO, CARDIAC AND SYSTEMIC PERFUSION    Ripley Fraise, MD 11/09/19 7371   Ripley Fraise, MD 12-03-2019 [phone]

## 2019-11-17 DEATH — deceased

## 2019-11-21 DIAGNOSIS — C7951 Secondary malignant neoplasm of bone: Secondary | ICD-10-CM | POA: Insufficient documentation

## 2019-11-21 NOTE — Progress Notes (Signed)
  Radiation Oncology         [phone] ________________________________  Name: Emily Kim MRN: [id number]  Date: 09/24/2019  DOB: 11/14/1940  SIMULATION AND TREATMENT PLANNING NOTE  DIAGNOSIS:     ICD-10-CM   1. Malignant neoplasm of right upper lobe of lung ([place])  C34.11      Site:  chest  NARRATIVE:  The patient was brought to the [place].  Identity was confirmed.  All relevant records and images related to the planned course of therapy were reviewed.   Written consent to proceed with treatment was confirmed which was freely given after reviewing the details related to the planned course of therapy had been reviewed with the patient.  Then, the patient was set-up in a stable reproducible  supine position for radiation therapy.  CT images were obtained.  Surface markings were placed.    Medically necessary complex treatment device(s) for immobilization:  Customized vac-lock bag.   The CT images were loaded into the planning software.  Then the target and avoidance structures were contoured.  Treatment planning then occurred.  The radiation prescription was entered and confirmed.  A total of 3 complex treatment devices were fabricated which relate to the designed radiation treatment fields. Additional reduced fields will be used as necessary to improve the dose homogeneity of the plan. Each of these customized fields/ complex treatment devices will be used on a daily basis during the radiation course. I have requested : 3D Simulation  I have requested a DVH of the following structures: target volume, spinal cord, lungs, heart.   The patient will undergo daily image guidance to ensure accurate localization of the target, and adequate minimize dose to the normal surrounding structures in close proximity to the target.   PLAN:  The patient will receive 30 Gy in 10 fractions.    ________________________________   Jodelle Gross, MD, PhD

## 2019-11-21 NOTE — Progress Notes (Signed)
  Radiation Oncology         [phone] ________________________________  Name: Emily Kim MRN: [id number]  Date: 10/02/2019  DOB: [birth year]  SIMULATION AND TREATMENT PLANNING NOTE  DIAGNOSIS:     ICD-10-CM   1. Bone metastasis ([place])  C79.51      Site:  Left femur  NARRATIVE:  The patient was brought to the [place].  Identity was confirmed.  All relevant records and images related to the planned course of therapy were reviewed.   Written consent to proceed with treatment was confirmed which was freely given after reviewing the details related to the planned course of therapy had been reviewed with the patient.  Then, the patient was set-up in a stable reproducible  supine position for radiation therapy.  CT images were obtained.  Surface markings were placed.    Medically necessary complex treatment device(s) for immobilization:  Vac-lock bag.   The CT images were loaded into the planning software.  Then the target and avoidance structures were contoured.  Treatment planning then occurred.  The radiation prescription was entered and confirmed.  A total of 2 complex treatment devices were fabricated which relate to the designed radiation treatment fields. Each of these customized fields/ complex treatment devices will be used on a daily basis during the radiation course. I have requested : Isodose Plan.   PLAN:  The patient will receive 30 Gy in 10 fractions.  ________________________________   Jodelle Gross, MD, PhD

## 2019-12-18 NOTE — Discharge Summary (Signed)
Physician Discharge Summary         Patient ID: Emily Kim MRN: [id number] DOB/AGE: September 21, 1940 79 y.o.  Admit date: 10/23/2019 Discharge date:  12-12-2019  Discharge Diagnoses:    Patient Active Problem List   Diagnosis Date Noted  . Shock circulatory ([place]) 11/11/2019  . AKI (acute kidney injury) ([place])   . Ventilator dependent ([place])   . Acute respiratory failure with hypoxia ([place]) 11/11/2019  . Acute bilateral deep vein thrombosis (DVT) of upper extremities ([place]) 11/11/2019  . Pressure injury of skin 11/14/2019  . Difficult intravenous access   . Adenocarcinoma of right lung, stage 4 ([place])   . Pulmonary embolism ([place]) 09/23/2019  . 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 because pleurx catheter was not draining and worsening renal function.  She was started on therapy for pneumonia and UTI, and changed to lovenox.  Noted to have altered mental status after receiving pain medication.  On 11/09/19 developed worsening hypoxia with respiratory leading to cardiac arrest with ROSC after about 10 minutes.  She was intubated and started on pressors.  Consults:  Oncology 2/20  Nephrology > not a candidate for HD   She remained pressor dep with severe TME felt to be related to hypoxic injury from arrest and acute renal failure and was made full comfort care at family's request and compassionately extubated as full DNR and expired within an hour with comfort measures in place.   Signed: Christinia Gully 11/20/2019, 7:18 AM

## 2020-01-29 NOTE — Progress Notes (Signed)
  Radiation Oncology         [phone] ________________________________  Name: Emily Kim MRN: [id number]  Date: 10/16/2019  DOB: 1940/10/27  End of Treatment Note  Diagnosis:   Bone metastasis     Indication for treatment::  palliative       Radiation treatment dates:   10/08/19 - 10/16/19  Site/dose:   The left femur was treated with a 2-field isodose plan.    Narrative: The patient tolerated radiation treatment relatively well.   The patient initially received 3 Gy x 3 fractions, followed by 4 Gray x3 fractions.  The patient therefore received a total of 21 Gray.  Plan: The patient has completed radiation treatment. The patient will return to radiation oncology clinic for routine followup in one month. I advised the patient to call or return sooner if they have any questions or concerns related to their recovery or treatment. ________________________________  Jodelle Gross, M.D., Ph.D.

## 2021-08-31 IMAGING — DX DG CHEST 1V PORT
1 series · 1 of 1 positions shown · non-contrast
Comparison: Single-view of the chest 09/30/2019.

CLINICAL DATA: Status post right chest tube placement.

EXAM:
PORTABLE CHEST 1 VIEW

[chest ap]
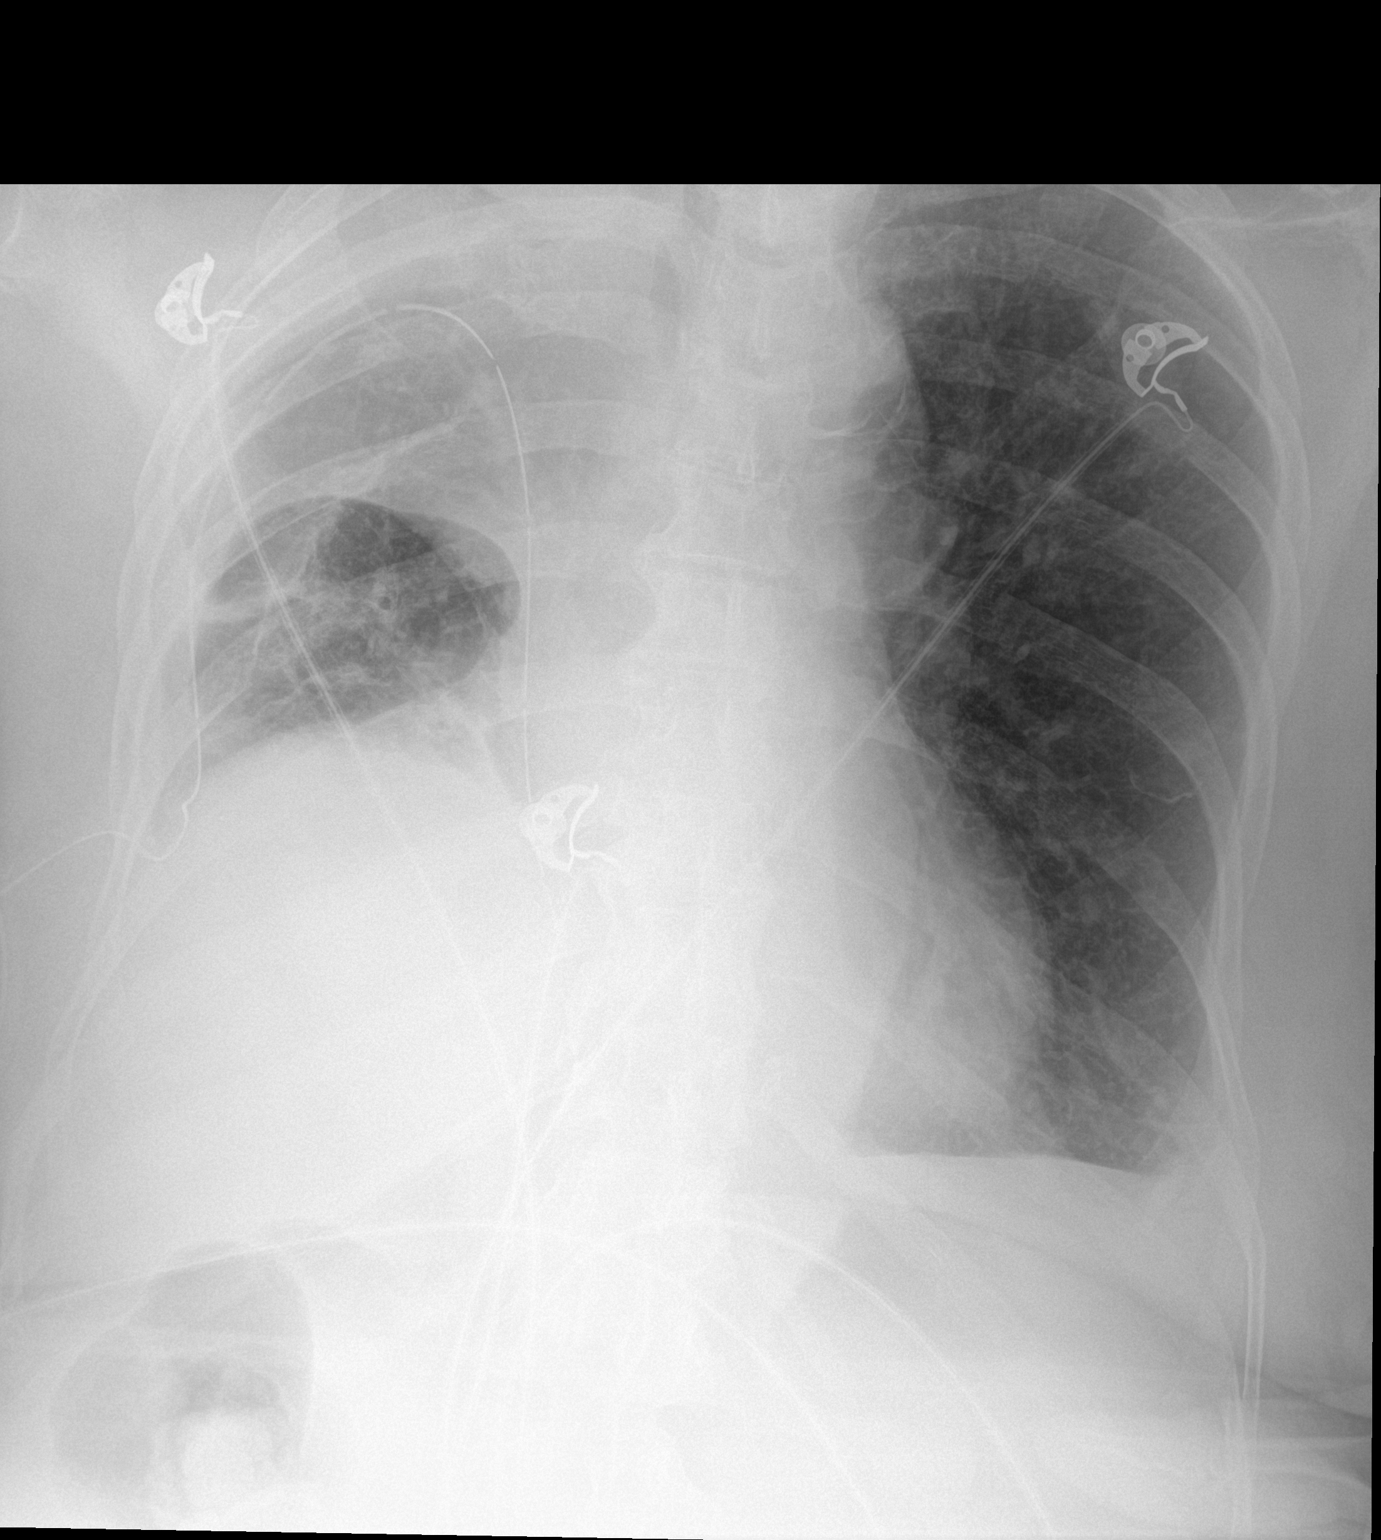

[1 of 1 positions shown; findings below may reference images not displayed]

FINDINGS: New small bore right chest tube is in place. Right pleural effusion
is decreased there is improved in aeration in the right chest. No
pneumothorax. Very small left pleural effusion noted. Heart size is
normal. Atherosclerosis is seen.
IMPRESSION: Decreased right pleural effusion and improved aeration in the right
chest after chest tube placement. Negative for pneumothorax. No new
abnormality.

## 2021-09-04 IMAGING — DX DG CHEST 1V PORT
1 series · 1 of 1 positions shown · non-contrast
Comparison: Chest x-ray 10/02/2019.

CLINICAL DATA: 78-year-old female with history of uterine cancer
status post hysterectomy presenting with shortness of breath and
hemoptysis for the past 2 weeks with some weight loss.

EXAM:
PORTABLE CHEST 1 VIEW

[chest ap]
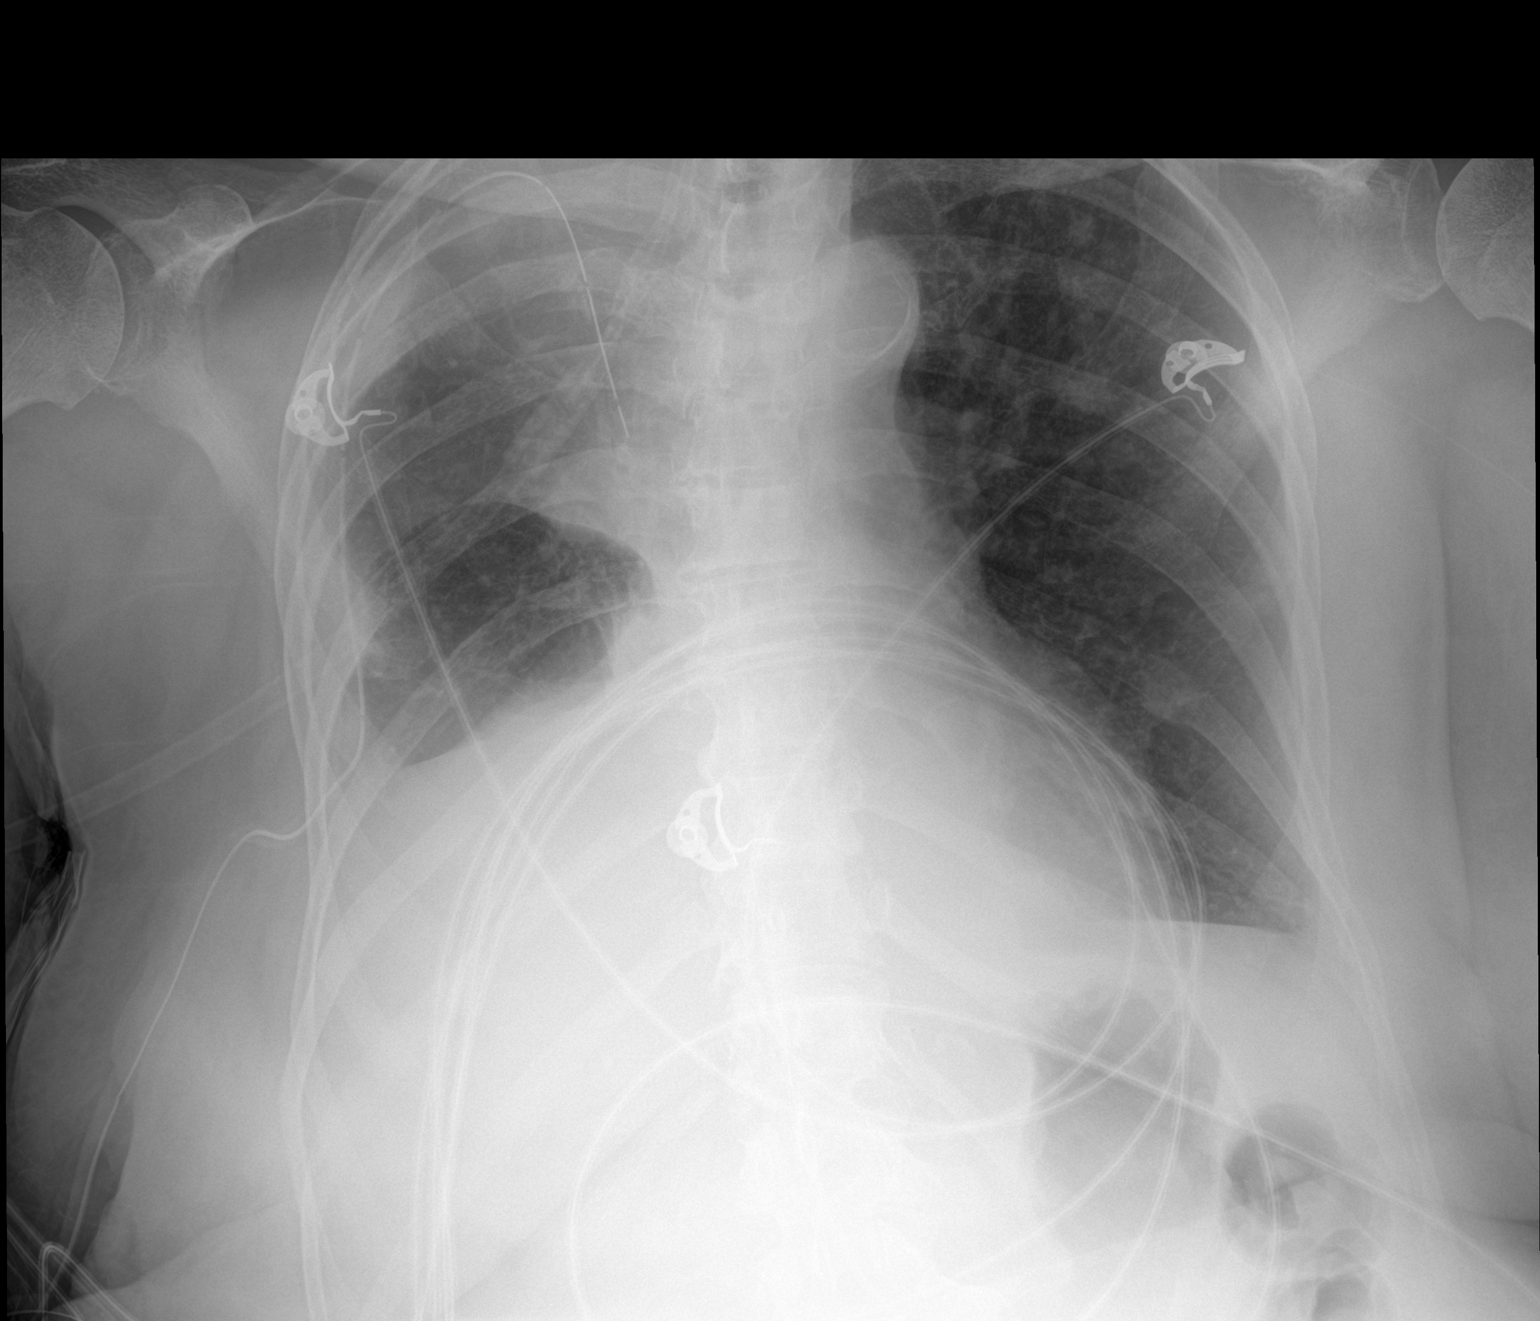

[1 of 1 positions shown; findings below may reference images not displayed]

FINDINGS: Right-sided chest tube with tip in the medial aspect of the mid
right hemithorax. Large right pleural effusion which remains
partially loculated, with the largest component in the apex of the
right hemithorax. Multiple pulmonary nodules noted throughout the
left lung. Small left pleural effusion. No evidence of pulmonary
edema. Heart size is upper limits of normal. Masslike area in the
right hilar region. Aortic atherosclerosis.
IMPRESSION: 1. Support apparatus, as above.
2. Known right upper lobe perihilar mass redemonstrated with
persistent large partially loculated right pleural effusion, stable
compared to prior examinations.
3. Small left pleural effusion.
4. Multiple pulmonary nodules in the left lung, concerning for
metastatic disease.

## 2021-09-04 IMAGING — DX DG CHEST 2V
2 series · 2 of 2 positions shown · non-contrast
Comparison: Radiograph 10/05/2019

CLINICAL DATA: Pleural effusion

EXAM:
CHEST - 2 VIEW

[chest lat]
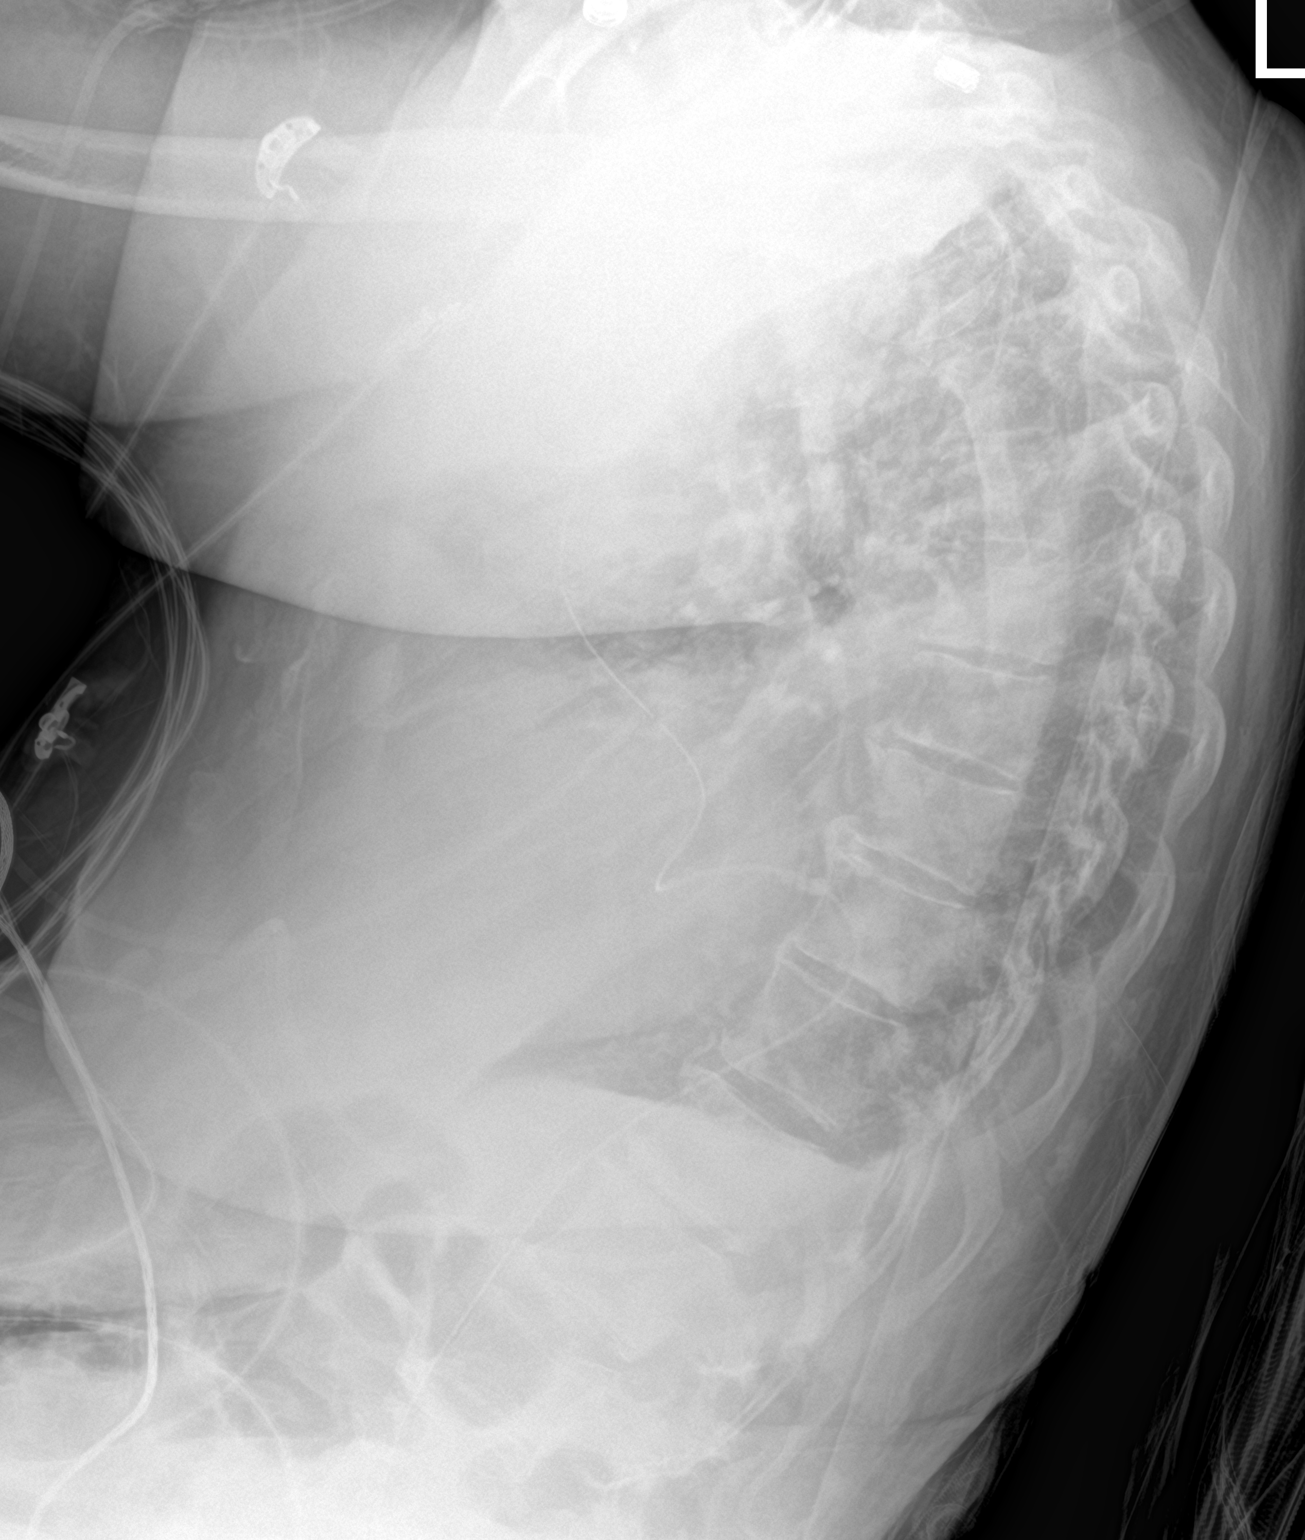

[chest ap]
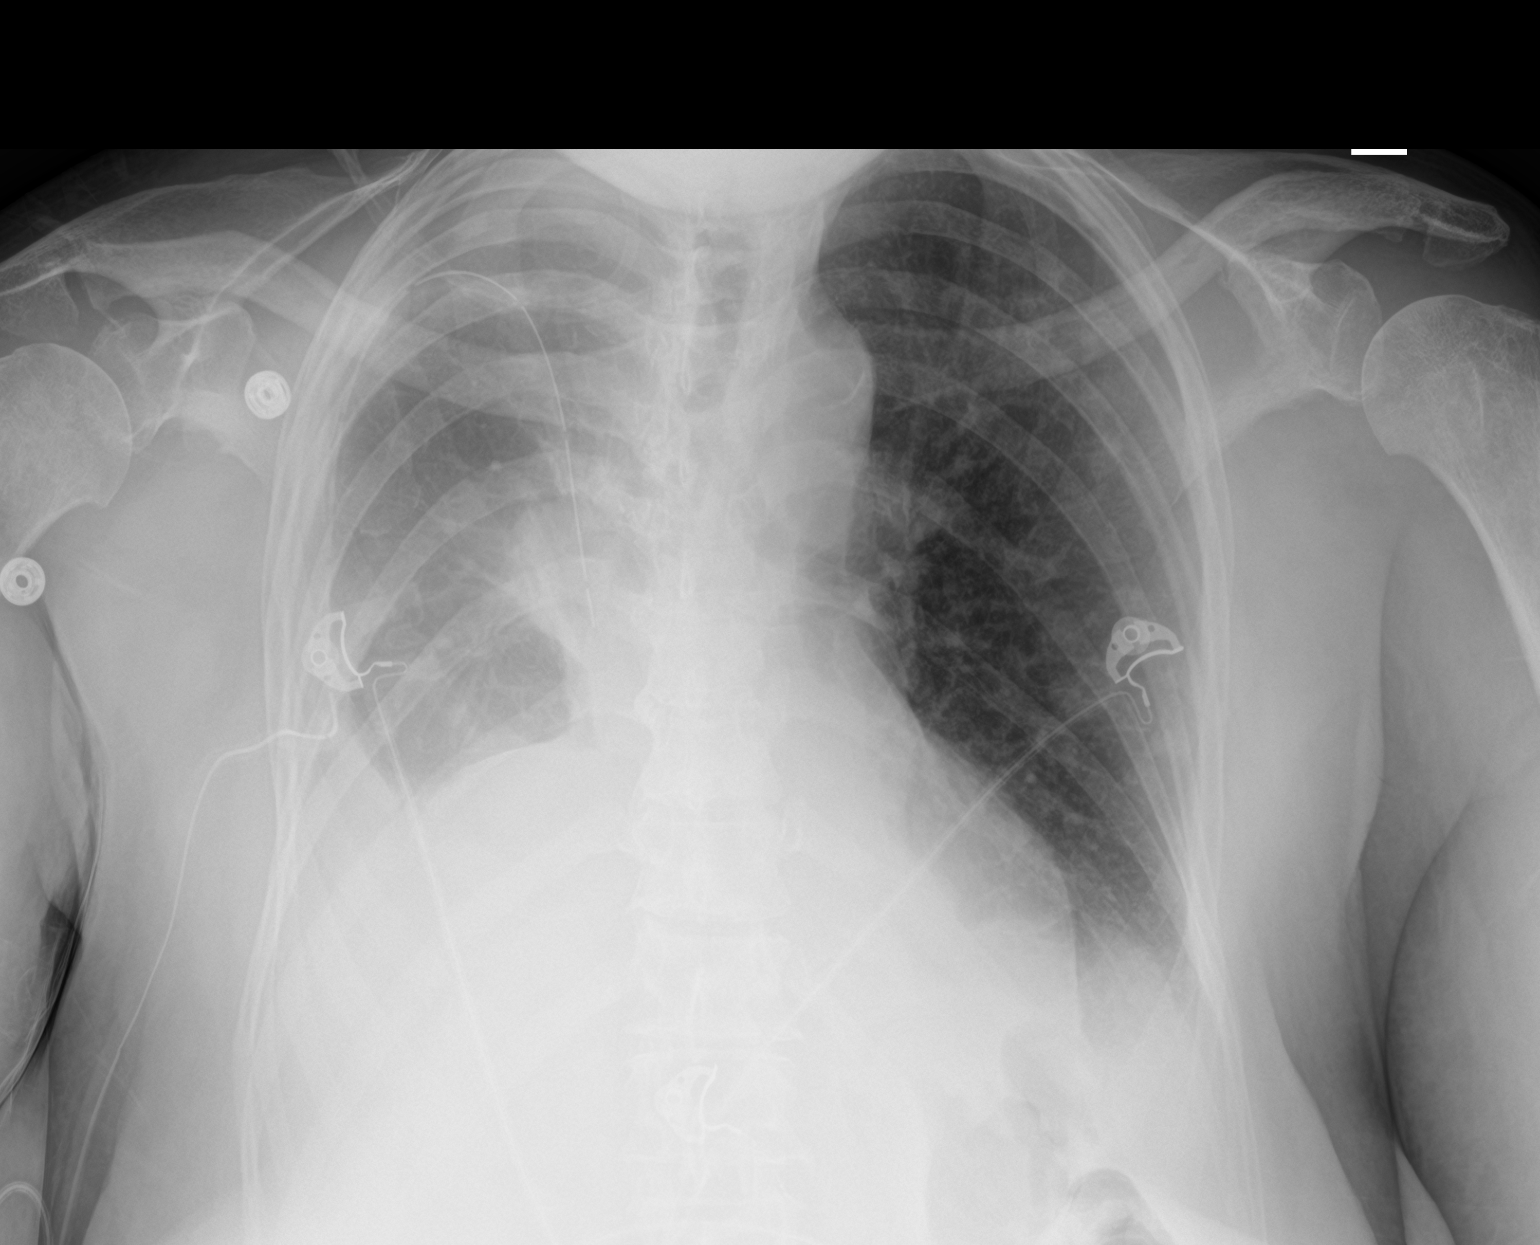

[2 of 2 positions shown; findings below may reference images not displayed]

FINDINGS: Redemonstration of an inferiorly directed right chest tube. Tip
terminates in the mid to lower right thorax medially.
Redemonstration of the partially loculated moderate right pleural
effusion with fluid tracking along the fissures. Diffusely increased
opacity in the right hemithorax is compatible with passive areas of
atelectasis and fluid layering posteriorly. Nodular opacities are
again seen in the left lung. Small left effusion is present as well.
Visible portions of the cardiac silhouette are unchanged from prior
with a calcified aorta.
IMPRESSION: Stable appearance of the chest with moderate right pleural effusion
and small left pleural effusion. Stable nodular opacities in the
left lung.
